# Patient Record
Sex: Male | Born: 1940 | Race: White | Hispanic: No | Marital: Married | Smoking: Never smoker
Health system: Southern US, Community
[De-identification: ages and names within clinical notes are randomized; demographics above are authoritative.]

## PROBLEM LIST (undated history)

## (undated) DIAGNOSIS — N400 Enlarged prostate without lower urinary tract symptoms: Secondary | ICD-10-CM

## (undated) DIAGNOSIS — E790 Hyperuricemia without signs of inflammatory arthritis and tophaceous disease: Secondary | ICD-10-CM

## (undated) DIAGNOSIS — E213 Hyperparathyroidism, unspecified: Secondary | ICD-10-CM

## (undated) DIAGNOSIS — Z9289 Personal history of other medical treatment: Secondary | ICD-10-CM

## (undated) DIAGNOSIS — K219 Gastro-esophageal reflux disease without esophagitis: Secondary | ICD-10-CM

## (undated) DIAGNOSIS — I10 Essential (primary) hypertension: Secondary | ICD-10-CM

## (undated) DIAGNOSIS — G459 Transient cerebral ischemic attack, unspecified: Secondary | ICD-10-CM

## (undated) DIAGNOSIS — N183 Chronic kidney disease, stage 3 unspecified: Secondary | ICD-10-CM

## (undated) DIAGNOSIS — E781 Pure hyperglyceridemia: Secondary | ICD-10-CM

## (undated) DIAGNOSIS — E782 Mixed hyperlipidemia: Secondary | ICD-10-CM

## (undated) DIAGNOSIS — N189 Chronic kidney disease, unspecified: Secondary | ICD-10-CM

## (undated) HISTORY — DX: Hyperuricemia without signs of inflammatory arthritis and tophaceous disease: E79.0

## (undated) HISTORY — DX: Chronic kidney disease, unspecified: N18.9

## (undated) HISTORY — PX: OTHER SURGICAL HISTORY: SHX169

## (undated) HISTORY — DX: Benign prostatic hyperplasia without lower urinary tract symptoms: N40.0

## (undated) HISTORY — DX: Hyperparathyroidism, unspecified: E21.3

## (undated) HISTORY — PX: ACHILLES TENDON REPAIR: SUR1153

## (undated) HISTORY — DX: Gastro-esophageal reflux disease without esophagitis: K21.9

---

## 2000-01-13 ENCOUNTER — Ambulatory Visit (HOSPITAL_COMMUNITY): Admission: RE | Admit: 2000-01-13 | Discharge: 2000-01-13 | Payer: Self-pay | Admitting: Gastroenterology

## 2007-11-14 ENCOUNTER — Ambulatory Visit: Payer: Self-pay | Admitting: Physician Assistant

## 2007-12-04 ENCOUNTER — Ambulatory Visit: Payer: Self-pay | Admitting: Family Medicine

## 2011-12-13 DIAGNOSIS — G459 Transient cerebral ischemic attack, unspecified: Secondary | ICD-10-CM

## 2011-12-13 HISTORY — DX: Transient cerebral ischemic attack, unspecified: G45.9

## 2012-04-23 ENCOUNTER — Ambulatory Visit: Payer: Self-pay | Admitting: Urology

## 2012-05-02 ENCOUNTER — Emergency Department: Payer: Self-pay | Admitting: Emergency Medicine

## 2012-05-02 LAB — CBC WITH DIFFERENTIAL/PLATELET
HCT: 45.9 % (ref 40.0–52.0)
HGB: 15.6 g/dL (ref 13.0–18.0)
Lymphocyte #: 0.9 10*3/uL — ABNORMAL LOW (ref 1.0–3.6)
Lymphocyte %: 8.7 %
MCH: 32.3 pg (ref 26.0–34.0)
MCHC: 34.1 g/dL (ref 32.0–36.0)
MCV: 95 fL (ref 80–100)
Monocyte %: 3.5 %
Neutrophil #: 9.4 10*3/uL — ABNORMAL HIGH (ref 1.4–6.5)
Neutrophil %: 87 %
Platelet: 226 10*3/uL (ref 150–440)
RBC: 4.84 10*6/uL (ref 4.40–5.90)
RDW: 13.4 % (ref 11.5–14.5)

## 2012-05-02 LAB — COMPREHENSIVE METABOLIC PANEL
Albumin: 4.3 g/dL (ref 3.4–5.0)
Alkaline Phosphatase: 61 U/L (ref 50–136)
Bilirubin,Total: 0.7 mg/dL (ref 0.2–1.0)
Calcium, Total: 9.5 mg/dL (ref 8.5–10.1)
Co2: 24 mmol/L (ref 21–32)
Creatinine: 1.72 mg/dL — ABNORMAL HIGH (ref 0.60–1.30)
EGFR (African American): 45 — ABNORMAL LOW
EGFR (Non-African Amer.): 39 — ABNORMAL LOW
Glucose: 192 mg/dL — ABNORMAL HIGH (ref 65–99)
Osmolality: 284 (ref 275–301)
SGPT (ALT): 45 U/L
Total Protein: 8.4 g/dL — ABNORMAL HIGH (ref 6.4–8.2)

## 2012-05-02 LAB — LIPASE, BLOOD: Lipase: 204 U/L (ref 73–393)

## 2012-05-02 LAB — URINALYSIS, COMPLETE
Bacteria: NONE SEEN
Bilirubin,UR: NEGATIVE
Glucose,UR: NEGATIVE mg/dL (ref 0–75)
Nitrite: NEGATIVE
Ph: 6 (ref 4.5–8.0)
RBC,UR: 2700 /HPF (ref 0–5)
Specific Gravity: 1.017 (ref 1.003–1.030)
Squamous Epithelial: NONE SEEN

## 2013-10-08 ENCOUNTER — Emergency Department (HOSPITAL_COMMUNITY)
Admission: EM | Admit: 2013-10-08 | Discharge: 2013-10-08 | Disposition: A | Discharge status: Home / Self Care | Payer: Medicare Other | Attending: Emergency Medicine | Admitting: Emergency Medicine

## 2013-10-08 ENCOUNTER — Emergency Department (HOSPITAL_COMMUNITY): Payer: Medicare Other

## 2013-10-08 ENCOUNTER — Encounter (HOSPITAL_COMMUNITY): Payer: Self-pay | Admitting: Emergency Medicine

## 2013-10-08 DIAGNOSIS — Z7982 Long term (current) use of aspirin: Secondary | ICD-10-CM | POA: Insufficient documentation

## 2013-10-08 DIAGNOSIS — M545 Low back pain, unspecified: Secondary | ICD-10-CM | POA: Insufficient documentation

## 2013-10-08 DIAGNOSIS — I10 Essential (primary) hypertension: Secondary | ICD-10-CM | POA: Insufficient documentation

## 2013-10-08 DIAGNOSIS — M549 Dorsalgia, unspecified: Secondary | ICD-10-CM

## 2013-10-08 DIAGNOSIS — Z791 Long term (current) use of non-steroidal anti-inflammatories (NSAID): Secondary | ICD-10-CM | POA: Insufficient documentation

## 2013-10-08 DIAGNOSIS — Z79899 Other long term (current) drug therapy: Secondary | ICD-10-CM | POA: Insufficient documentation

## 2013-10-08 DIAGNOSIS — Z87442 Personal history of urinary calculi: Secondary | ICD-10-CM | POA: Insufficient documentation

## 2013-10-08 HISTORY — DX: Essential (primary) hypertension: I10

## 2013-10-08 LAB — URINALYSIS, ROUTINE W REFLEX MICROSCOPIC
Bilirubin Urine: NEGATIVE
Glucose, UA: NEGATIVE mg/dL
Hgb urine dipstick: NEGATIVE
Ketones, ur: NEGATIVE mg/dL
Protein, ur: NEGATIVE mg/dL

## 2013-10-08 MED ORDER — DEXAMETHASONE SODIUM PHOSPHATE 10 MG/ML IJ SOLN
10.0000 mg | Freq: Once | INTRAMUSCULAR | Status: AC
  Administered 2013-10-08: 10 mg via INTRAMUSCULAR
  Filled 2013-10-08: qty 1

## 2013-10-08 NOTE — ED Notes (Signed)
Pt returned from xray

## 2013-10-08 NOTE — ED Notes (Signed)
Patient states started having back pain x 10 days.  Patient states 10/10 sharp in nature in L flank.   Patient denies urinary problems.   Patient states he had lamenectomy L5 in 1990.   Patient states it feels like that again.   Patient states has been to primary doctor and received muscle relaxants and anti inflammatories, with no relief.

## 2013-10-08 NOTE — ED Notes (Signed)
Patient has had back pain for ten days so he went to his PCP and he got his urine checked for blood and infection and it was clear.  The PCP did give him a muscle relaxer.  The patient has had kidney stones in the past and it does not feel the same.  He says the pain is intermittent and it was getting better and he has had spasms that bring him to his knees.  He says this time he was trimming some herbs when his pain started ten days ago.  Today, he was just standing and he began to have pain and couldn't move.  It lasted 20-30 seconds, went down to his knees and it got better.  He is here to be evaluated.

## 2013-10-08 NOTE — ED Notes (Signed)
Family at bedside. 

## 2013-10-08 NOTE — ED Provider Notes (Addendum)
CSN: [id number]     Arrival date & time 10/08/13  1159 History   First MD Initiated Contact with Patient 10/08/13 1354     Chief Complaint  Patient presents with  . Back Pain   (Consider location/radiation/quality/duration/timing/severity/associated sxs/prior Treatment) HPI Brendan Nguyen 72 year old male presents emergency department chief complaint of left lower back pain.  Patient came to the emergency department for evaluation.  He states that he was sent for MRI.  The patient has a history of degenerative spinal changes.  He does have history of laminectomy at L5 space.  The patient states that these ago he twisted and had immediate the left side.  He has seen by his primary care physician who treated him with Mobic and muscle relaxer.  The patient states his pain got better however today he was standing at the counter eating when he had sudden onset severe spasm of left-sided "brought him to his knees."  Patient called Dr. Trudee Grip their office who referred him to the ED.  she has a distant history of kidney stones.  He is unsure if his pain today is similar.  He said the pain is sudden, gripping, lasting approximately 30 seconds.  It does not radiate.  He denies any urinary symptoms.Denies weakness, loss of bowel/bladder function or saddle anesthesia. Denies neck stiffness, headache, rash.  Denies fever or recent procedures to back.   Past Medical History  Diagnosis Date  . Hypertension    Past Surgical History  Procedure Laterality Date  . L5 lamenectomy     No family history on file. History  Substance Use Topics  . Smoking status: Never Smoker   . Smokeless tobacco: Not on file  . Alcohol Use: No    Review of Systems Ten systems reviewed and are negative for acute change, except as noted in the HPI.   Allergies  Review of patient's allergies indicates no known allergies.  Home Medications   Current Outpatient Rx  Name  Route  Sig  Dispense  Refill  . aspirin EC 81 MG  tablet   Oral   Take 81 mg by mouth daily.         Marland Kitchen gemfibrozil (LOPID) 600 MG tablet   Oral   Take 600 mg by mouth 2 (two) times daily before a meal.         . lisinopril (PRINIVIL,ZESTRIL) 10 MG tablet   Oral   Take 10 mg by mouth daily.         . meloxicam (MOBIC) 15 MG tablet   Oral   Take 15 mg by mouth daily.         . metaxalone (SKELAXIN) 800 MG tablet   Oral   Take 800 mg by mouth 3 (three) times daily.          BP 115/59  Pulse 73  Temp(Src) 97.5 F (36.4 C) (Oral)  Resp 18  Ht 5\' 8"  (1.727 m)  Wt 201 lb 1.6 oz (91.218 kg)  BMI 30.58 kg/m2  SpO2 99% Physical Exam Physical Exam  Nursing note and vitals reviewed. Constitutional: He appears well-developed and well-nourished. No distress.  HENT:  Head: Normocephalic and atraumatic.  Eyes: Conjunctivae normal are normal. No scleral icterus.  Neck: Normal range of motion. Neck supple.  Cardiovascular: Normal rate, regular rhythm and normal heart sounds.   Pulmonary/Chest: Effort normal and breath sounds normal. No respiratory distress.  Abdominal: Soft. There is no tenderness.  Musculoskeletal: He exhibits no edema.  No TTP. Limited rom  of the back due stiffness. DTRs and strength normal. - straight leg raise.  Neurological: He is alert.  Skin: Skin is warm and dry. He is not diaphoretic.  Psychiatric: His behavior is normal.    ED Course  Procedures (including critical care time) Labs Review Labs Reviewed  URINALYSIS, ROUTINE W REFLEX MICROSCOPIC   Imaging Review Dg Lumbar Spine Complete  10/08/2013   CLINICAL DATA:  Left flank pain, back pain  EXAM: LUMBAR SPINE - COMPLETE 4+ VIEW  COMPARISON:  None.  FINDINGS: Diffuse lower thoracic and lumbar degenerative changes and spondylosis. Lower lumbar facet arthropathy also present from L3-S1. No compression fracture, wedge-shaped deformity or focal kyphosis. No pars defects. Pedicles appear intact. Nonobstructive bowel gas pattern. Pelvic  calcifications consistent with venous phleboliths. Symmetric SI joint arthropathy.  IMPRESSION: Degenerative changes. No acute finding.   Electronically Signed   By: Ruel Favors M.D.   On: 10/08/2013 16:07    EKG Interpretation   None       MDM   1. Back pain     Filed Vitals:   10/08/13 1619  BP: 115/59  Pulse: 73  Temp:   Resp: 18    The patient without red like symptoms.  He would not qualify for MRI to the emergency department.  Explained this to the patient to seek he understands.  Patient had a urine drawn yesterday that was negative however will repeat the examination to evaluate for possible kidney stone.  Patient also requested plain films of the back.     5:06 PM Patient without apparent emergent need for MRI. No red flag symptoms. No pain here in ED.  I have spoken with office personal at Dr. Trudee Grip office who states that they did advise the patient to come to the ED, however they did not advise the MRI would necessarily be done.  Will repeat a urine and plain films on the patient.  Patient urine without signs of infection, white cells or blood consistent with possible kidney stone.  He has no repeat of his pain here at the emergency department.  The patient is currently taking Mobic and antispasmodic.  Patient also has Percocet at home from previous injury.    The patient will be given IM Decadron here in the ED.  He is to followup with Dr. Trudee Grip office tomorrow.  Advised patient to take by mouth pain medication if his pain again becomes severe.  Do not feel he needs any further evaluation at this time.. The patient appears reasonably screened and/or stabilized for discharge and I doubt any other medical condition or other [facility] requiring further screening, evaluation, or treatment in the ED at this time prior to discharge.     Arthor Captain, PA-C 10/08/13 1709  Arthor Captain, PA-C 10/20/13 [phone]

## 2013-10-09 NOTE — ED Provider Notes (Signed)
Medical screening examination/treatment/procedure(s) were performed by non-physician practitioner and as supervising physician I was immediately available for consultation/collaboration.  EKG Interpretation   None        Shaneal Barasch R. Hero Kulish, MD 10/09/13 2233 

## 2013-10-21 NOTE — ED Provider Notes (Signed)
Medical screening examination/treatment/procedure(s) were performed by non-physician practitioner and as supervising physician I was immediately available for consultation/collaboration.  EKG Interpretation   None        Juliet Rude. Rubin Payor, MD 10/21/13 1454

## 2015-04-05 NOTE — Op Note (Signed)
PATIENT NAME:  Brendan Nguyen, Brendan Nguyen MR#:  [id number] DATE OF BIRTH:  07-12-1941  DATE OF PROCEDURE:  04/23/2012  PREOPERATIVE DIAGNOSIS: Benign prostatic hypertrophy with bladder outlet obstruction.   PREOPERATIVE DIAGNOSES:  1. Benign prostatic hypertrophy with bladder outlet obstruction.  2. Superficial bladder cancer. 3. Meatal stenosis.  PROCEDURES:  1. Photovaporization of the prostate with green light laser. 2. Transurethral resection of bladder tumor and vaporization with the green light laser. 3. Meatotomy.   SURGEON: Otelia Limes. Yves Dill, MD   ANESTHETIST: Dr. Phineas Douglas   ANESTHETIC METHOD: General.   INDICATIONS: See the dictated history and physical. After informed consent, the patient requests the above procedure.   OPERATIVE SUMMARY: After adequate general anesthesia had been obtained, the patient was placed into dorsal lithotomy position and the perineum was prepped and draped in the usual fashion. The patient had a meatal stenosis with a 3 mm meatus. I was unable to pass the laser scope. Therefore, meatotomy was clamped and meatotomy was performed. At this point the scope was easily passed into the bladder. Scope was coupled with the camera to assist passage into the bladder. The patient had trilobar BPH with visual obstruction. Bladder was then thoroughly examined. Both ureteral orifices were identified and had clear efflux. The patient had a 5 mm papillary tumor just lateral to the right ureteral orifice. Using the green light laser fiber set at 80 watts, the tumor was vaporized and the base was also cauterized. At this point vaporization of the prostate was begun at the 6 o'clock position and the bladder neck tissue and median lobe was vaporized at a setting of 80 watts. Next, the lateral tissue followed by anterior and apical tissue was vaporized. After complete vaporization, the scope was removed. A 20 French silicone catheter was placed and irrigated until clear. A B and O suppository  was placed. The procedure was then terminated and the patient was transferred to the recovery room in stable condition.   ____________________________ Otelia Limes. Yves Dill, MD [email or web address] D: 04/23/2012 08:52:55 ET T: 04/23/2012 13:14:17 ET JOB#: [id number]  cc: Otelia Limes. Yves Dill, MD, <Dictator> Royston Cowper MD ELECTRONICALLY SIGNED 04/23/2012 13:35

## 2015-04-05 NOTE — H&P (Signed)
PATIENT NAME:  Brendan Nguyen, Brendan Nguyen MR#:  [id number] DATE OF BIRTH:  [birth year]  DATE OF ADMISSION:  04/23/2012  CHIEF COMPLAINT: Difficulty voiding.   HISTORY OF PRESENT ILLNESS: Mr. Keatts is a  74 year old white male with a long history of benign prostatic hypertrophy and LUTS. He is status post microwave thermal therapy in February 2012, but his symptoms have recurred. AUA symptom score was 15 with a quality of life score of 4. He comes in now for green light photovaporization of the prostate.   ALLERGIES: No known drug allergies.   CURRENT MEDICATIONS: Cialis.   PAST SURGICAL HISTORY: L5 laminectomy in 1989.   SOCIAL HISTORY: He denied tobacco or alcohol use.   FAMILY HISTORY: Noncontributory.   PAST AND CURRENT MEDICAL CONDITIONS:  1. Chronic back pain.  2. Erectile dysfunction.  3. Hypogonadism.   REVIEW OF SYSTEMS: The patient denied chest pain, shortness of breath, diabetes, stroke, or hypertension.   PHYSICAL EXAMINATION:   GENERAL: Well-nourished white male in no acute distress.   HEENT: Pupils were equally round and reactive to light and accommodation. Extraocular movements were intact.   NECK: Supple. No palpable cervical adenopathy. No audible carotid bruits.   LUNGS: Clear to auscultation.   HEART: Regular rate and rhythm without audible murmurs or gallops.   ABDOMEN: Soft and nontender abdomen.   GENITOURINARY: Circumcised. Testes smooth and nontender.   RECTAL: 50 grams, smooth nontender prostate.   NEUROMUSCULAR: Alert and oriented x3.   IMPRESSION: Benign prostatic hypertrophy with bladder outlet obstruction.   PLAN: Photovaporization of the prostate with the green light laser.  ____________________________ Otelia Limes. Yves Dill, MD [email or web address] D: 04/17/2012 13:07:47 ET T: 04/17/2012 13:46:10 ET JOB#: [id number]  cc: Otelia Limes. Yves Dill, MD, <Dictator> Royston Cowper MD ELECTRONICALLY SIGNED 04/17/2012 14:42

## 2015-06-05 ENCOUNTER — Telehealth: Payer: Self-pay | Admitting: Podiatry

## 2015-06-05 NOTE — Telephone Encounter (Signed)
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 I KNOW HE IS FULL BUT WAS NOT SURE IF HE WANTED TO WORK IN A DOCTOR. HE HAS TOENAIL FUNGUS AND NEEDS A DEBRIDE.PLEASE CALL ASAP

## 2015-07-06 ENCOUNTER — Encounter: Payer: Self-pay | Admitting: Podiatry

## 2015-07-06 ENCOUNTER — Ambulatory Visit: Payer: Medicare Other

## 2015-07-06 ENCOUNTER — Ambulatory Visit (INDEPENDENT_AMBULATORY_CARE_PROVIDER_SITE_OTHER): Payer: PPO | Admitting: Podiatry

## 2015-07-06 VITALS — BP 116/80 | HR 80 | Resp 16 | Ht 68.0 in | Wt 202.0 lb

## 2015-07-06 DIAGNOSIS — M79673 Pain in unspecified foot: Secondary | ICD-10-CM

## 2015-07-06 DIAGNOSIS — L603 Nail dystrophy: Secondary | ICD-10-CM | POA: Diagnosis not present

## 2015-07-06 NOTE — Progress Notes (Signed)
   Subjective:    Patient ID: Brendan Nguyen, male    DOB: [birth year], 74 y.o.   MRN: [id number]  HPI have a toenail fungus on my toenails, I did get rid of it but it has seemed to come back. Over the counter medication does not seem to work     Review of Systems  Skin:       Change in nails        Objective:   Physical Exam presents today complaining of thick yellow nails he states have been like this for quite some time. He has tried over-the-counter medications which have not helped. I have reviewed his past medical history medications allergy surgery social history and review of systems. Pulses are strongly palpable bilateral. Neurologic sensorium is intact per Semmes-Weinstein monofilament. Deep tendon reflexes are brisk and equal bilateral. Muscle strength +5 over 5 dorsiflexion plantar flexors and inverters and everters all intrinsic musculature is intact. Orthopedic evaluation demonstrates mild hallux valgus deformity my hammertoe deformities are noted bilaterally but are asymptomatic. Cutaneous evaluation does demonstrate what appears to be tinea pedis plantar aspect of the bilateral foot as well as thick yellow dystrophic onychomycotic nails.        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  Plan: Sample of the nail were taken today and sent for pathologic evaluation I will follow-up with him once the results return.

## 2015-07-27 ENCOUNTER — Encounter: Payer: Self-pay | Admitting: Podiatry

## 2015-07-27 ENCOUNTER — Ambulatory Visit (INDEPENDENT_AMBULATORY_CARE_PROVIDER_SITE_OTHER): Payer: PPO | Admitting: Podiatry

## 2015-07-27 VITALS — BP 143/86 | HR 69 | Resp 12

## 2015-07-27 DIAGNOSIS — L603 Nail dystrophy: Secondary | ICD-10-CM | POA: Diagnosis not present

## 2015-07-27 DIAGNOSIS — Z79899 Other long term (current) drug therapy: Secondary | ICD-10-CM | POA: Diagnosis not present

## 2015-07-27 MED ORDER — TERBINAFINE HCL 250 MG PO TABS: 250.0000 mg | ORAL_TABLET | Freq: Every day | ORAL | Status: DC

## 2015-07-27 NOTE — Progress Notes (Signed)
He presents today for follow-up of his onychomycosis and lab reports. He states that his nails are unchanged.  Objective: Vital signs are stable he is alert and oriented 3 pulses are palpable bilateral. Neurologic sensorium is intact per Semmes-Weinstein monofilament. Deep tendon reflexes are intact bilateral and muscle strength +5 over 5 dorsiflexion plantar flexors and inverters everters all intrinsic musculature is intact. Orthopedic evaluation demonstrate all joints distal to the angle full range of motion without crepitation. Cutaneous evaluation does demonstrate thick yellow dystrophic onychomycotic nails. Pathology report does demonstrate today positive onychomycosis consisting of positive saprophyticus fungus and dramatic phytic fungus.  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  Plan: We discussed the etiology pathology conservative versus surgical therapies. At this point he would like to consider oral anti-fungals. We went over the pros and cons of the oral anti-fungal medications today consisting of rashes and itching headache and steady gait. We also discussed the possible liver artifacts. I requested a liver profile. I went ahead and wrote a prescription for 30 days of Lamisil 250 mg tablets 1 by mouth daily he will watch for signs and symptoms of complications which we discussed in great detail I will follow-up with him in 1 month.

## 2015-07-27 NOTE — Patient Instructions (Signed)

## 2015-07-28 LAB — HEPATIC FUNCTION PANEL: Bilirubin, Direct: 0.16 mg/dL (ref 0.00–0.40)

## 2015-08-24 ENCOUNTER — Encounter: Payer: Self-pay | Admitting: Podiatry

## 2015-08-24 ENCOUNTER — Ambulatory Visit (INDEPENDENT_AMBULATORY_CARE_PROVIDER_SITE_OTHER): Payer: PPO | Admitting: Podiatry

## 2015-08-24 DIAGNOSIS — L603 Nail dystrophy: Secondary | ICD-10-CM

## 2015-08-24 DIAGNOSIS — Z79899 Other long term (current) drug therapy: Secondary | ICD-10-CM | POA: Diagnosis not present

## 2015-08-24 MED ORDER — TERBINAFINE HCL 250 MG PO TABS: 250.0000 mg | ORAL_TABLET | Freq: Every day | ORAL | Status: DC

## 2015-08-24 NOTE — Progress Notes (Signed)
He presents today after his first month of Lamisil. He denies fever chills nausea vomiting muscle aches and pains. No rashes or itching.  Objective: Pulses are strongly palpable. Neurologic sensorium is intact. Deep tendon reflexes are intact bilateral and muscle strength +5 over 5 dorsiflexion plantar flexors and inverters everters all intrinsic musculature is intact. No place demonstrated very little change.  Assessment: Onychomycosis bilateral.  Plan: Continue long-term therapy with Lamisil 250 mg tablets 1 by mouth daily 90 days. I also requested liver profile and we'll follow-up with him should this come back abnormal.

## 2015-09-01 LAB — CBC WITH DIFFERENTIAL/PLATELET
Basophils Absolute: 0 10*3/uL (ref 0.0–0.2)
Basos: 1 %
EOS (ABSOLUTE): 0.4 10*3/uL (ref 0.0–0.4)
EOS: 5 %
HEMATOCRIT: 41.1 % (ref 37.5–51.0)
Hemoglobin: 14.7 g/dL (ref 12.6–17.7)
Immature Grans (Abs): 0 10*3/uL (ref 0.0–0.1)
Immature Granulocytes: 0 %
LYMPHS ABS: 2.2 10*3/uL (ref 0.7–3.1)
Lymphs: 33 %
MCH: 32.3 pg (ref 26.6–33.0)
MCHC: 35.8 g/dL — AB (ref 31.5–35.7)
MCV: 90 fL (ref 79–97)
Monocytes Absolute: 0.4 10*3/uL (ref 0.1–0.9)
Monocytes: 7 %
Neutrophils Absolute: 3.5 10*3/uL (ref 1.4–7.0)
Neutrophils: 54 %
PLATELETS: 213 10*3/uL (ref 150–379)
RBC: 4.55 x10E6/uL (ref 4.14–5.80)
RDW: 14.6 % (ref 12.3–15.4)
WBC: 6.6 10*3/uL (ref 3.4–10.8)

## 2015-09-01 LAB — HEPATIC FUNCTION PANEL
ALK PHOS: 52 IU/L (ref 39–117)
ALT: 35 IU/L (ref 0–44)
AST: 24 IU/L (ref 0–40)
Albumin: 4.6 g/dL (ref 3.5–4.8)
Bilirubin Total: 0.6 mg/dL (ref 0.0–1.2)
Bilirubin, Direct: 0.33 mg/dL (ref 0.00–0.40)
Total Protein: 7.3 g/dL (ref 6.0–8.5)

## 2015-09-14 ENCOUNTER — Other Ambulatory Visit: Payer: Self-pay | Admitting: Family Medicine

## 2015-09-18 ENCOUNTER — Other Ambulatory Visit: Payer: Self-pay | Admitting: Family Medicine

## 2015-10-23 ENCOUNTER — Other Ambulatory Visit: Payer: Self-pay | Admitting: Family Medicine

## 2015-10-25 ENCOUNTER — Other Ambulatory Visit: Payer: Self-pay | Admitting: Family Medicine

## 2015-11-10 ENCOUNTER — Telehealth: Payer: Self-pay | Admitting: Family Medicine

## 2015-11-10 NOTE — Telephone Encounter (Signed)
LMTCB

## 2015-11-10 NOTE — Telephone Encounter (Signed)
Pt wants to do labs before appt on 12/5.  Please call [phone]

## 2015-11-11 NOTE — Telephone Encounter (Signed)
Patient was advised that he needs office visit before blood work ordered. Patient has not been in office since 03/2015. Patient had labs ordered last year and labs were not performed.

## 2015-11-16 ENCOUNTER — Ambulatory Visit (INDEPENDENT_AMBULATORY_CARE_PROVIDER_SITE_OTHER): Payer: PPO | Admitting: Family Medicine

## 2015-11-16 ENCOUNTER — Telehealth: Payer: Self-pay | Admitting: *Deleted

## 2015-11-16 ENCOUNTER — Encounter: Payer: Self-pay | Admitting: Family Medicine

## 2015-11-16 VITALS — BP 136/76 | HR 68 | Temp 97.4°F | Resp 16 | Ht 68.0 in | Wt 212.0 lb

## 2015-11-16 DIAGNOSIS — I10 Essential (primary) hypertension: Secondary | ICD-10-CM

## 2015-11-16 DIAGNOSIS — R42 Dizziness and giddiness: Secondary | ICD-10-CM

## 2015-11-16 NOTE — Patient Instructions (Signed)
Patient declines all immunizations

## 2015-11-16 NOTE — Telephone Encounter (Signed)
Pt states he has run out of his rx for the fungus.  I reviewed the medication records and he was prescribed 90 doses of Terbinafine.  I called the phone number and the male answered states she can give pt a message.  I told her pt had received 90 doses and that was the therapeutic dosing of the medication, she said she would tell the pt.

## 2015-11-16 NOTE — Progress Notes (Signed)
Name: Brendan Nguyen   MRN: [id number]    DOB: [birth year]   Date:11/16/2015       Progress Note  Subjective  Chief Complaint  No chief complaint on file.   HPI Here with  Questions about meds.  Occ. Gets dizzy.  But no specific sx.otherwise.  No problem-specific assessment & plan notes found for this encounter.   Past Medical History  Diagnosis Date  . Hypertension     Past Surgical History  Procedure Laterality Date  . L5 lamenectomy      History reviewed. No pertinent family history.  Social History   Social History  . Marital Status: Married    Spouse Name: N/A  . Number of Children: N/A  . Years of Education: N/A   Occupational History  . Not on file.   Social History Main Topics  . Smoking status: Never Smoker   . Smokeless tobacco: Not on file  . Alcohol Use: No  . Drug Use: No  . Sexual Activity: Not on file   Other Topics Concern  . Not on file   Social History Narrative     Current outpatient prescriptions:  .  aspirin EC 81 MG tablet, Take 81 mg by mouth daily., Disp: , Rfl:  .  gemfibrozil (LOPID) 600 MG tablet, TAKE 1 TABLET BY MOUTH TWICE A DAY, Disp: 30 tablet, Rfl: 0 .  losartan (COZAAR) 25 MG tablet, TAKE 1 TABLET BY MOUTH EVERY DAY (**NEED OFFICE VISIT), Disp: 15 tablet, Rfl: 0 .  terbinafine (LAMISIL) 250 MG tablet, Take 1 tablet (250 mg total) by mouth daily., Disp: 90 tablet, Rfl: 0  Not on File   Review of Systems  Constitutional: Negative for fever, chills, weight loss and malaise/fatigue.  HENT: Negative for hearing loss.   Eyes: Negative for blurred vision and double vision.  Respiratory: Negative for cough, shortness of breath and wheezing.   Cardiovascular: Negative for chest pain, palpitations and leg swelling.  Gastrointestinal: Negative for heartburn, abdominal pain and blood in stool.  Genitourinary: Negative for dysuria, urgency and frequency.  Musculoskeletal: Negative for myalgias and back pain.  Neurological:  Positive for dizziness (occ.). Negative for tremors, weakness and headaches.  Psychiatric/Behavioral: Negative for depression.      Objective  Filed Vitals:   11/16/15 1345  BP: 136/76  Pulse: 68  Temp: 97.4 F (36.3 C)  Resp: 16  Height: 5\' 8"  (1.727 m)  Weight: 212 lb (96.163 kg)    Physical Exam  Constitutional: He is oriented to person, place, and time and well-developed, well-nourished, and in no distress. No distress.  HENT:  Head: Normocephalic and atraumatic.  Eyes: Conjunctivae and EOM are normal. Pupils are equal, round, and reactive to light. No scleral icterus.  Neck: Normal range of motion. Neck supple. Carotid bruit is not present. No thyromegaly present.  Cardiovascular: Normal rate, regular rhythm, normal heart sounds and intact distal pulses.  Exam reveals no gallop and no friction rub.   No murmur heard. Pulmonary/Chest: Effort normal and breath sounds normal. No respiratory distress. He has no wheezes. He has no rales.  Abdominal: Soft. Bowel sounds are normal. He exhibits no distension. There is no tenderness. There is no rebound.  Musculoskeletal: Normal range of motion. He exhibits no edema.  Lymphadenopathy:    He has no cervical adenopathy.  Neurological: He is alert and oriented to person, place, and time.  Vitals reviewed.      Recent Results (from the past 2160 hour(s))  Hepatic function  panel     Status: None   Collection Time: 08/31/15  2:26 PM  Result Value Ref Range   Total Protein 7.3 6.0 - 8.5 g/dL   Albumin 4.6 3.5 - 4.8 g/dL   Bilirubin Total 0.6 0.0 - 1.2 mg/dL   Bilirubin, Direct 0.33 0.00 - 0.40 mg/dL   Alkaline Phosphatase 52 39 - 117 IU/L   AST 24 0 - 40 IU/L   ALT 35 0 - 44 IU/L  CBC with Differential/Platelet     Status: Abnormal   Collection Time: 08/31/15  2:26 PM  Result Value Ref Range   WBC 6.6 3.4 - 10.8 x10E3/uL   RBC 4.55 4.14 - 5.80 x10E6/uL   Hemoglobin 14.7 12.6 - 17.7 g/dL   Hematocrit 41.1 37.5 - 51.0 %    MCV 90 79 - 97 fL   MCH 32.3 26.6 - 33.0 pg   MCHC 35.8 (H) 31.5 - 35.7 g/dL   RDW 14.6 12.3 - 15.4 %   Platelets 213 150 - 379 x10E3/uL   Neutrophils 54 %   Lymphs 33 %   Monocytes 7 %   Eos 5 %   Basos 1 %   Neutrophils Absolute 3.5 1.4 - 7.0 x10E3/uL   Lymphocytes Absolute 2.2 0.7 - 3.1 x10E3/uL   Monocytes Absolute 0.4 0.1 - 0.9 x10E3/uL   EOS (ABSOLUTE) 0.4 0.0 - 0.4 x10E3/uL   Basophils Absolute 0.0 0.0 - 0.2 x10E3/uL   Immature Granulocytes 0 %   Immature Grans (Abs) 0.0 0.0 - 0.1 x10E3/uL     Assessment & Plan  Problem List Items Addressed This Visit      Cardiovascular and Mediastinum   HBP (high blood pressure)    Other Visit Diagnoses    Dizziness    -  Primary    Relevant Orders    CBC with Differential    Lipid Profile    Comprehensive Metabolic Panel (CMET)    TSH       No orders of the defined types were placed in this encounter.   1. Dizziness  - CBC with Differential - Lipid Profile - Comprehensive Metabolic Panel (CMET) - TSH  2. Essential hypertension -cont. meds

## 2015-11-20 LAB — CBC WITH DIFFERENTIAL/PLATELET
BASOS: 1 %
Basophils Absolute: 0.1 10*3/uL (ref 0.0–0.2)
EOS (ABSOLUTE): 0.4 10*3/uL (ref 0.0–0.4)
EOS: 6 %
HEMATOCRIT: 42.8 % (ref 37.5–51.0)
HEMOGLOBIN: 15.6 g/dL (ref 12.6–17.7)
IMMATURE GRANULOCYTES: 1 %
Immature Grans (Abs): 0.1 10*3/uL (ref 0.0–0.1)
LYMPHS ABS: 2.1 10*3/uL (ref 0.7–3.1)
Lymphs: 32 %
MCH: 32.8 pg (ref 26.6–33.0)
MCHC: 36.4 g/dL — ABNORMAL HIGH (ref 31.5–35.7)
MCV: 90 fL (ref 79–97)
MONOS ABS: 0.6 10*3/uL (ref 0.1–0.9)
Monocytes: 9 %
Neutrophils Absolute: 3.4 10*3/uL (ref 1.4–7.0)
Neutrophils: 51 %
Platelets: 217 10*3/uL (ref 150–379)
RBC: 4.75 x10E6/uL (ref 4.14–5.80)
RDW: 13.7 % (ref 12.3–15.4)
WBC: 6.6 10*3/uL (ref 3.4–10.8)

## 2015-11-21 LAB — COMPREHENSIVE METABOLIC PANEL
A/G RATIO: 2 (ref 1.1–2.5)
ALBUMIN: 4.9 g/dL — AB (ref 3.5–4.8)
ALT: 24 IU/L (ref 0–44)
AST: 25 IU/L (ref 0–40)
Alkaline Phosphatase: 58 IU/L (ref 39–117)
BUN/Creatinine Ratio: 20 (ref 10–22)
BUN: 30 mg/dL — ABNORMAL HIGH (ref 8–27)
Bilirubin Total: 0.6 mg/dL (ref 0.0–1.2)
CALCIUM: 10.6 mg/dL — AB (ref 8.6–10.2)
CO2: 23 mmol/L (ref 18–29)
Chloride: 99 mmol/L (ref 97–106)
Creatinine, Ser: 1.49 mg/dL — ABNORMAL HIGH (ref 0.76–1.27)
GFR calc Af Amer: 52 mL/min/{1.73_m2} — ABNORMAL LOW (ref >59)
GFR calc non Af Amer: 45 mL/min/{1.73_m2} — ABNORMAL LOW (ref >59)
Globulin, Total: 2.4 g/dL (ref 1.5–4.5)
Glucose: 115 mg/dL — ABNORMAL HIGH (ref 65–99)
POTASSIUM: 5 mmol/L (ref 3.5–5.2)
Sodium: 139 mmol/L (ref 136–144)
Total Protein: 7.3 g/dL (ref 6.0–8.5)

## 2015-11-21 LAB — LIPID PANEL
Chol/HDL Ratio: 7.9 ratio units — ABNORMAL HIGH (ref 0.0–5.0)
Cholesterol, Total: 197 mg/dL (ref 100–199)
HDL: 25 mg/dL — ABNORMAL LOW (ref >39)
Triglycerides: 667 mg/dL (ref 0–149)

## 2015-11-21 LAB — TSH: TSH: 2.54 u[IU]/mL (ref 0.450–4.500)

## 2015-11-24 ENCOUNTER — Other Ambulatory Visit: Payer: Self-pay | Admitting: Family Medicine

## 2015-11-24 LAB — HGB A1C W/O EAG: HEMOGLOBIN A1C: 6.2 % — AB (ref 4.8–5.6)

## 2015-11-27 LAB — SPECIMEN STATUS REPORT

## 2015-12-07 ENCOUNTER — Other Ambulatory Visit: Payer: Self-pay | Admitting: Family Medicine

## 2015-12-28 ENCOUNTER — Ambulatory Visit: Payer: PPO | Admitting: Podiatry

## 2015-12-30 ENCOUNTER — Other Ambulatory Visit: Payer: Self-pay | Admitting: *Deleted

## 2015-12-30 MED ORDER — LOSARTAN POTASSIUM 25 MG PO TABS: 25.0000 mg | ORAL_TABLET | Freq: Every day | ORAL | Status: DC

## 2016-01-04 ENCOUNTER — Ambulatory Visit (INDEPENDENT_AMBULATORY_CARE_PROVIDER_SITE_OTHER): Payer: PPO | Admitting: Podiatry

## 2016-01-04 ENCOUNTER — Encounter: Payer: Self-pay | Admitting: Podiatry

## 2016-01-04 DIAGNOSIS — Z79899 Other long term (current) drug therapy: Secondary | ICD-10-CM

## 2016-01-04 DIAGNOSIS — L603 Nail dystrophy: Secondary | ICD-10-CM | POA: Diagnosis not present

## 2016-01-04 DIAGNOSIS — M79675 Pain in left toe(s): Secondary | ICD-10-CM | POA: Diagnosis not present

## 2016-01-04 DIAGNOSIS — M79674 Pain in right toe(s): Secondary | ICD-10-CM

## 2016-01-04 MED ORDER — TERBINAFINE HCL 250 MG PO TABS: 250.0000 mg | ORAL_TABLET | Freq: Every day | ORAL | Status: DC

## 2016-01-04 NOTE — Progress Notes (Signed)
He presents today for a follow-up of his onychomycosis. He states they are starting to clear up but I would like to have them clear to 100% and also need help cutting them because they're thick.  Objective: pulses are palpable bilateral. No calf pain. Nail plates are cleared by approximately 50% at this time. Pain in limb with onychomycosis.   Assessment nail dystrophy onychomycosis pain in limb.  Plan: debridement of toenails bilateral. I also started him on Lamisil 250 mg tablets 1 every other day 2 months. Follow-up with me in 3 months.

## 2016-02-05 ENCOUNTER — Other Ambulatory Visit: Payer: Self-pay | Admitting: Family Medicine

## 2016-02-06 DIAGNOSIS — R42 Dizziness and giddiness: Secondary | ICD-10-CM | POA: Diagnosis not present

## 2016-02-12 DIAGNOSIS — R002 Palpitations: Secondary | ICD-10-CM | POA: Diagnosis not present

## 2016-02-12 DIAGNOSIS — R55 Syncope and collapse: Secondary | ICD-10-CM | POA: Diagnosis not present

## 2016-02-12 DIAGNOSIS — I10 Essential (primary) hypertension: Secondary | ICD-10-CM | POA: Diagnosis not present

## 2016-02-17 DIAGNOSIS — R55 Syncope and collapse: Secondary | ICD-10-CM | POA: Diagnosis not present

## 2016-02-17 DIAGNOSIS — R002 Palpitations: Secondary | ICD-10-CM | POA: Diagnosis not present

## 2016-02-24 DIAGNOSIS — I10 Essential (primary) hypertension: Secondary | ICD-10-CM | POA: Diagnosis not present

## 2016-02-24 DIAGNOSIS — R55 Syncope and collapse: Secondary | ICD-10-CM | POA: Diagnosis not present

## 2016-02-24 DIAGNOSIS — E781 Pure hyperglyceridemia: Secondary | ICD-10-CM | POA: Diagnosis not present

## 2016-02-26 ENCOUNTER — Other Ambulatory Visit: Payer: Self-pay | Admitting: Family Medicine

## 2016-02-26 DIAGNOSIS — Z9229 Personal history of other drug therapy: Secondary | ICD-10-CM | POA: Diagnosis not present

## 2016-02-26 DIAGNOSIS — I10 Essential (primary) hypertension: Secondary | ICD-10-CM | POA: Diagnosis not present

## 2016-02-26 DIAGNOSIS — E781 Pure hyperglyceridemia: Secondary | ICD-10-CM | POA: Diagnosis not present

## 2016-02-26 DIAGNOSIS — G8929 Other chronic pain: Secondary | ICD-10-CM | POA: Diagnosis not present

## 2016-02-26 DIAGNOSIS — M25551 Pain in right hip: Secondary | ICD-10-CM | POA: Diagnosis not present

## 2016-02-26 DIAGNOSIS — R2689 Other abnormalities of gait and mobility: Secondary | ICD-10-CM | POA: Diagnosis not present

## 2016-02-26 DIAGNOSIS — M1611 Unilateral primary osteoarthritis, right hip: Secondary | ICD-10-CM | POA: Diagnosis not present

## 2016-02-29 DIAGNOSIS — L578 Other skin changes due to chronic exposure to nonionizing radiation: Secondary | ICD-10-CM | POA: Diagnosis not present

## 2016-02-29 DIAGNOSIS — L570 Actinic keratosis: Secondary | ICD-10-CM | POA: Diagnosis not present

## 2016-02-29 DIAGNOSIS — L812 Freckles: Secondary | ICD-10-CM | POA: Diagnosis not present

## 2016-02-29 DIAGNOSIS — L821 Other seborrheic keratosis: Secondary | ICD-10-CM | POA: Diagnosis not present

## 2016-02-29 DIAGNOSIS — Z808 Family history of malignant neoplasm of other organs or systems: Secondary | ICD-10-CM | POA: Diagnosis not present

## 2016-03-04 DIAGNOSIS — E781 Pure hyperglyceridemia: Secondary | ICD-10-CM | POA: Diagnosis not present

## 2016-03-04 DIAGNOSIS — Z9229 Personal history of other drug therapy: Secondary | ICD-10-CM | POA: Diagnosis not present

## 2016-03-04 DIAGNOSIS — I10 Essential (primary) hypertension: Secondary | ICD-10-CM | POA: Diagnosis not present

## 2016-03-07 ENCOUNTER — Encounter: Payer: Self-pay | Admitting: Physical Therapy

## 2016-03-07 ENCOUNTER — Ambulatory Visit: Payer: PPO | Attending: Family Medicine

## 2016-03-07 DIAGNOSIS — M25551 Pain in right hip: Secondary | ICD-10-CM | POA: Diagnosis not present

## 2016-03-07 DIAGNOSIS — R29898 Other symptoms and signs involving the musculoskeletal system: Secondary | ICD-10-CM | POA: Diagnosis not present

## 2016-03-07 NOTE — Patient Instructions (Signed)
Flexors, Standing    Stand, one foot on chair. Use support for balance, if needed. Slowly shift weight toward raised knee until stretch is felt in the deep hip flexor of standing leg and hamstrings of forward leg. Hold _30__ seconds.  Repeat _3__ times per session. Do _3__ sessions per day.  Press Up (Extension)    Prop yourself on your elbow. Maintain position with elbow on the floors. Hold for 3-5 minutes or longer. Repeat __6-8__ times throughout the day.   Piriformis Stretch - Supine    Pull R knee across body toward opposite shoulder. Hold slight stretch for _30__ seconds. Repeat _3__ times. Do _3__ times per day.   Single Leg - Eyes Open    Holding support, lift right leg while maintaining balance over other leg. Progress to removing hands from support surface for longer periods of time. Hold__30__ seconds. Repeat _3___ times per session. Do __2__ sessions per day.   Feet Heel-Toe "Tandem", Head Motion - Eyes Open    With eyes open, right foot directly in front of the other, Keep head facing forward. Once you can maintain position for at least 30 seconds without losing balance attempt to move head slowly: left and right. Repeat _3___ times per session. Do _3___ sessions per day.

## 2016-03-09 NOTE — Therapy (Signed)
[facility] [address], [place], [address] Phone: [phone]   Fax:  [phone]  Physical Therapy Evaluation  Patient Details  Name: Brendan Nguyen MRN: [id number] Date of Birth: Oct 15, 1941 Referring Provider: Dion Body  Encounter Date: 03/07/2016      PT End of Session - 03/09/16 0929    Visit Number 1   Number of Visits 13   Date for PT Re-Evaluation 04/18/16   Authorization Type no g codes   PT Start Time 1300   PT Stop Time 1400   PT Time Calculation (min) 60 min   Equipment Utilized During Treatment Gait belt   Activity Tolerance Patient tolerated treatment well   Behavior During Therapy [facility] for tasks assessed/performed      Past Medical History  Diagnosis Date  . Hypertension     Past Surgical History  Procedure Laterality Date  . L5 lamenectomy      There were no vitals filed for this visit.  Visit Diagnosis:  Pain in right hip - Plan: PT plan of care cert/re-cert  Weakness of right hip - Plan: PT plan of care cert/re-cert      Subjective Assessment - 03/09/16 0905    Subjective R hip pain   Pertinent History Pt reports 4-6 months of R hip pain. No known history trauma that he can recall and no prior history of R hip pain. He is also complaining worsening balance which he would also like to address on this date. History of L4/L5 laminectomy in 1972 but currently no complaints of regular back pain that he can associate with his hip pain. Pt states that the hip feels "uncomfortable and weak" but otherwise is unable to describe further. Pt describes the pain as in his R groin and into the medial R thigh. Pt reports radiographs were performed without notable abnormalities with the exception of maybe some mild DJD. Aggravating factors: flexing R hip actively, walking extended distances. Easing factors: heat, sitting, exercise and movement. No radicular symptoms and no  numbness/tingling in RLE. No warmth, redness, swelling, or bruising noted in the area. Pt reports history of prostate cancer which was "taken care of." No recent changes in bowel or bladder function. ROS is negative for red flags.   How long can you walk comfortably? 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 No/denies  Worst: 6/10, Best: 0/10, Present: 0/10            [facility] PT Assessment - 03/09/16 0907    Assessment   Medical Diagnosis R hip pain   Referring Provider Dion Body   Onset Date/Surgical Date 09/12/15  Approximately 4-6 months ago   Next MD Visit Not provided   Prior Therapy None reported   Precautions   Precautions Fall   Restrictions   Weight Bearing Restrictions No   Balance Screen   Has the patient fallen in the past 6 months Yes   How many times? One  Reports he fell onto grass while mowing lawn   Has the patient had a decrease in activity level because of a fear of falling?  Yes   Is the patient reluctant to leave their home because of a fear of falling?  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   ROM / Strength   AROM / PROM / Strength Strength   Strength   Overall  Strength Deficits   Overall Strength Comments Painful R hip AROM flexion (full ROM), painless PROM flexion, painful passive R hip IR. Painful, but strong, resisted R hip ER/IR. Decreased bilateral hip PROM extension however both active and passive extension are painless bilaterally. Lumbar AROM moderately to severly limited in flexion and extension. Lateral flexion and rotation not assessed. R hip scour is negative for pain. Sacral thrust negative for pain. FABER and FADIR negative for reproduction of pain. CPA of lumbar spine do not reproduce pain but are severly hypomobile   Strength Assessment Site Hip;Knee;Ankle   Right/Left Hip Right;Left   Right Hip Flexion 4-/5   Right Hip Extension 4/5   Right Hip External Rotation  4+/5   Right Hip Internal Rotation 4+/5   Right Hip ABduction 4/5   Right Hip ADduction 4/5   Left Hip Flexion 4+/5   Left Hip Extension 4/5   Left Hip External Rotation 4+/5   Left Hip Internal Rotation 4+/5   Left Hip ABduction 4/5   Left Hip ADduction 4/5   Right/Left Knee Right;Left   Right Knee Flexion 5/5   Right Knee Extension 5/5   Left Knee Flexion 5/5   Left Knee Extension 5/5   Right/Left Ankle Right;Left   Right Ankle Dorsiflexion 5/5   Left Ankle Dorsiflexion 5/5   Flexibility   Soft Tissue Assessment /Muscle Length --   Palpation   Palpation comment Pt is tender to palpation on iliopsoas with light pressure when compared to L side.    Transfers   Comments Independent with all transfers without UE support   Ambulation/Gait   Gait Comments Full gait screening deferred at this time however decreased trunk rotation noted during ambulation. Pt also ambulates with slightly widened BOS   Balance   Balance Assessed Yes   Standardized Balance Assessment   Standardized Balance Assessment Dynamic Gait Index;Berg Balance Test   Berg Balance Test   Sit to Stand Able to stand without using hands and stabilize independently   Standing Unsupported Able to stand  safely 2 minutes   Sitting with Back Unsupported but Feet Supported on Floor or Stool Able to sit safely and securely 2 minutes   Stand to Sit Sits safely with minimal use of hands   Transfers Able to transfer safely, minor use of hands   Standing Unsupported with Eyes Closed Able to stand 10 seconds safely   Standing Ubsupported with Feet Together Able to place feet together independently and stand 1 minute safely   From Standing, Reach Forward with Outstretched Arm Can reach confidently >25 cm (10")   From Standing Position, Pick up Object from Floor Able to pick up shoe safely and easily   From Standing Position, Turn to Look Behind Over each Shoulder Looks  behind from both sides and weight shifts well   Turn 360 Degrees Able to turn 360 degrees safely in 4 seconds or less   Standing Unsupported, Alternately Place Feet on Step/Stool Able to stand independently and safely and complete 8 steps in 20 seconds   Standing Unsupported, One Foot in Front Able to plae foot ahead of the other independently and hold 30 seconds   Standing on One Leg Tries to lift leg/unable to hold 3 seconds but remains standing independently   Total Score 52   Dynamic Gait Index   Level Surface Normal   Change in Gait Speed Normal   Gait with Horizontal Head Turns Mild Impairment   Gait with Vertical Head Turns Mild Impairment   Gait and Pivot Turn Normal   Step Over Obstacle Mild Impairment   Step Around Obstacles Normal   Steps Mild Impairment   Total Score 20       TREATMENT; Attempted repeated lumbar flexion and extension without change in symptoms. Pt issued prone on elbow hip flexor stretch, standing hip flexor stretch, supine piriformis stretch, tandem balance, and single leg balance to HEP. Handout provided and all exercises discussed and demonstrated with patient. Pt performed prone on elbow stretch with therapist. Discussed importance of avoided excessive anterior tilting/lumbar extension during  standing R hip flexor stretch;                         PT Long Term Goals - 03/09/16 0939    PT LONG TERM GOAL #1   Title Pt will be independent with HEP for management of R hip pain in order to resume all activities at home and work   Time 6   Period Weeks   Status New   PT LONG TERM GOAL #2   Title Pt will reports at or below 3/10 worst pain on NPRS in R hip with all aggravating activities   Baseline 03/07/16: worst: 6/10   Time 6   Period Weeks   Status New   PT LONG TERM GOAL #3   Title Pt will demonstrates fully symmetrical strength with R hip flexion in order to resume full responsibilities at home/work   Baseline 03/07/16: 4-/5 painful and weak   Time 6   Period Weeks   Status New   PT LONG TERM GOAL #4   Title Pt will demonstrate improvement in balance to at least 55/56 on BERG in order to reduce fall risk and be able to ride motorcycle safely   Baseline 03/07/16: 52/56   Time 6   Period Weeks   Status New               Plan - 03/09/16 [id number]    Clinical Impression Statement Pt is a pleasant 75 yo male referred for insidious onset of R hip pain over the last 4-6 months. PT evaluation is consistent with R hip flexor strain/tendonitis as pt reports pain and weakness with active and resisted R hip flexion. Painful palpation of iliopsoas complex when compared to L side. Passive R hip flexion and R hip scour are negative as well FADIR and FABER positions. Pt also presents with higher level balance deficits scoring 52/56 on BERG and 20/24 on DGI. R hip pain limits patients balance. R hip pain will be the focus primarily and will progress to include more balance activities as pain diminishes. Pt will beneift from skilled PT services to address deficits in R hip pain and balance in  order to resume all work and leisure goals   Pt will benefit from skilled therapeutic intervention in order to improve on the following deficits Abnormal gait;Decreased  balance;Decreased strength;Hypomobility;Pain   Rehab Potential Good   Clinical Impairments Affecting Rehab Potential Positive: motivation; Negative: chronicity   PT Frequency 2x / week   PT Duration 6 weeks   PT Treatment/Interventions Aquatic Therapy;Cryotherapy;Electrical Stimulation;Iontophoresis 4mg /ml Dexamethasone;Moist Heat;Traction;Ultrasound;DME Instruction;Gait training;Therapeutic activities;Therapeutic exercise;Balance training;Neuromuscular re-education;Patient/family education;Dry needling;Other (comment)  spinal mobilization/manipulation   PT Next Visit Plan Continue R hip stretches, lumbar mobilization, STM and iliopsoas release   PT Home Exercise Plan Prone on elbows for hip flexion stretch and lumbar extension, piriformis stretch, standing hip flexor stretch on stair/chair, tandem balance, single leg balance   Consulted and Agree with Plan of Care Patient         Problem List Patient Active Problem List   Diagnosis Date Noted  . HBP (high blood pressure) 11/16/2015   Phillips Grout PT, DPT   Huprich,Jason 03/09/2016, 10:54 AM  [facility] [address] [place], [address] Phone: [phone]   Fax:  [phone]  Name: Brendan Nguyen MRN: [id number] Date of Birth: 1941/06/11

## 2016-03-10 ENCOUNTER — Ambulatory Visit: Payer: PPO | Admitting: Physical Therapy

## 2016-03-14 ENCOUNTER — Ambulatory Visit: Payer: PPO | Attending: Family Medicine | Admitting: Physical Therapy

## 2016-03-14 DIAGNOSIS — M25551 Pain in right hip: Secondary | ICD-10-CM | POA: Diagnosis not present

## 2016-03-14 NOTE — Therapy (Signed)
[facility] [address], [place], [address] Phone: [phone]   Fax:  [phone]  Physical Therapy Treatment  Patient Details  Name: DAXON GREASON MRN: [id number] Date of Birth: 1941/03/17 Referring Provider: Dion Body  Encounter Date: 03/14/2016      PT End of Session - 03/14/16 1208    Visit Number 2   Number of Visits 13   Date for PT Re-Evaluation 04/18/16   Authorization Type no g codes   PT Start Time 1200   PT Stop Time 1240   PT Time Calculation (min) 40 min   Equipment Utilized During Treatment Gait belt   Activity Tolerance Patient tolerated treatment well   Behavior During Therapy [facility] for tasks assessed/performed      Past Medical History  Diagnosis Date  . Hypertension     Past Surgical History  Procedure Laterality Date  . L5 lamenectomy      There were no vitals filed for this visit.  Visit Diagnosis:  Pain in right hip      Subjective Assessment - 03/14/16 1200    Subjective Pt reports continued R inguinal pain. He does report he is exercising inconsistently.   Pertinent History Pt reports 4-6 months of R hip pain. No known history trauma that he can recall and no prior history of R hip pain. He is also complaining worsening balance which he would also like to address on this date. History of L4/L5 laminectomy in 1972 but currently no complaints of regular back pain that he can associate with his hip pain. Pt states that the hip feels "uncomfortable and weak" but otherwise is unable to describe further. Pt describes the pain as in his R groin and into the medial R thigh. Pt reports radiographs were performed without notable abnormalities with the exception of maybe some mild DJD. Aggravating factors: flexing R hip actively, walking extended distances. Easing factors: heat, sitting, exercise and movement. No radicular symptoms and no numbness/tingling in RLE. No warmth,  redness, swelling, or bruising noted in the area. Pt reports history of prostate cancer which was "taken care of." No recent changes in bowel or bladder function. ROS is negative for red flags.   How long can you walk comfortably? 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 No/denies               Objective:  supine hooklying: Palpation of B iliopsoas, incr. Pain on R.   Performed x10 heel slides on L, on R. On R noted incr. Lumbar lordosis and incr. Pain with performance.  Performed manual pinning of psoas, x10 heel slides with decr. Pain but still present.  Performed same with cuing for TA contraction, pain still present but minimal. Educated pt and had pt perform tennis ball and weight compression of psoas with heel slides.  Pt required extensive education, practice, and re-education on palpation region to find most applicable region for performance of exercise.  PT also educated pt on avoiding excessive hip flexion at home until pain improves.                       PT Long Term Goals - 03/09/16 0939    PT LONG TERM GOAL #1   Title Pt will  be independent with HEP for management of R hip pain in order to resume all activities at home and work   Time 6   Period Weeks   Status New   PT LONG TERM GOAL #2   Title Pt will reports at or below 3/10 worst pain on NPRS in R hip with all aggravating activities   Baseline 03/07/16: worst: 6/10   Time 6   Period Weeks   Status New   PT LONG TERM GOAL #3   Title Pt will demonstrates fully symmetrical strength with R hip flexion in order to resume full responsibilities at home/work   Baseline 03/07/16: 4-/5 painful and weak   Time 6   Period Weeks   Status New   PT LONG TERM GOAL #4   Title Pt will demonstrate improvement in balance to at  least 55/56 on BERG in order to reduce fall risk and be able to ride motorcycle safely   Baseline 03/07/16: 52/56   Time 6   Period Weeks   Status New               Plan - 03/14/16 1247    Clinical Impression Statement Pt responded well to focused intervention to reduce pre-activation of iliopsoas. Pain with hip flex. decr. from 5/10 to 3/10 following intervention and pt able to verbalize and understand how to perform at home. Deferred balance tx due to focusing on pain. PT also educated pt on avoiding irritating factors.   Pt will benefit from skilled therapeutic intervention in order to improve on the following deficits Abnormal gait;Decreased balance;Decreased strength;Hypomobility;Pain   Rehab Potential Good   Clinical Impairments Affecting Rehab Potential Positive: motivation; Negative: chronicity   PT Frequency 2x / week   PT Duration 6 weeks   PT Treatment/Interventions Aquatic Therapy;Cryotherapy;Electrical Stimulation;Iontophoresis 4mg /ml Dexamethasone;Moist Heat;Traction;Ultrasound;DME Instruction;Gait training;Therapeutic activities;Therapeutic exercise;Balance training;Neuromuscular re-education;Patient/family education;Dry needling;Other (comment)  spinal mobilization/manipulation   PT Next Visit Plan Continue R hip stretches, lumbar mobilization, STM and iliopsoas release   PT Home Exercise Plan Prone on elbows for hip flexion stretch and lumbar extension, piriformis stretch, standing hip flexor stretch on stair/chair, tandem balance, single leg balance   Consulted and Agree with Plan of Care Patient        Problem List Patient Active Problem List   Diagnosis Date Noted  . HBP (high blood pressure) 11/16/2015    Vendetta Pittinger PT DPT 03/14/2016, 12:55 PM  [facility] [address] [place], [address] Phone: [phone]   Fax:  [phone]  Name: JAQUORI HUNSINGER MRN: [id number] Date of  Birth: 01/04/1941

## 2016-03-15 DIAGNOSIS — N289 Disorder of kidney and ureter, unspecified: Secondary | ICD-10-CM | POA: Diagnosis not present

## 2016-03-15 DIAGNOSIS — E781 Pure hyperglyceridemia: Secondary | ICD-10-CM | POA: Diagnosis not present

## 2016-03-16 ENCOUNTER — Encounter: Payer: Medicare Other | Admitting: Physical Therapy

## 2016-03-17 ENCOUNTER — Ambulatory Visit: Payer: Self-pay | Admitting: Family Medicine

## 2016-03-24 ENCOUNTER — Ambulatory Visit: Payer: PPO | Admitting: Physical Therapy

## 2016-03-24 DIAGNOSIS — R3121 Asymptomatic microscopic hematuria: Secondary | ICD-10-CM | POA: Diagnosis not present

## 2016-03-24 DIAGNOSIS — Z8551 Personal history of malignant neoplasm of bladder: Secondary | ICD-10-CM | POA: Diagnosis not present

## 2016-03-24 DIAGNOSIS — E291 Testicular hypofunction: Secondary | ICD-10-CM | POA: Diagnosis not present

## 2016-03-24 DIAGNOSIS — M25551 Pain in right hip: Secondary | ICD-10-CM | POA: Diagnosis not present

## 2016-03-24 DIAGNOSIS — Z79899 Other long term (current) drug therapy: Secondary | ICD-10-CM | POA: Diagnosis not present

## 2016-03-24 DIAGNOSIS — N5201 Erectile dysfunction due to arterial insufficiency: Secondary | ICD-10-CM | POA: Diagnosis not present

## 2016-03-24 NOTE — Therapy (Signed)
[facility] [address] [place], [address] Phone: [phone]   Fax:  [phone]  Physical Therapy Treatment  Patient Details  Name: Brendan Nguyen MRN: [id number] Date of Birth: 05-09-1941 Referring Provider: Dion Body  Encounter Date: 03/24/2016      PT End of Session - 03/24/16 1306    Visit Number 3   Number of Visits 13   Date for PT Re-Evaluation 04/18/16   Authorization Type no g codes   PT Start Time March 22, 1254   PT Stop Time 1333   PT Time Calculation (min) 38 min   Equipment Utilized During Treatment Gait belt   Activity Tolerance Patient tolerated treatment well   Behavior During Therapy [facility] for tasks assessed/performed      Past Medical History  Diagnosis Date  . Hypertension     Past Surgical History  Procedure Laterality Date  . L5 lamenectomy      There were no vitals filed for this visit.      Subjective Assessment - 03/24/16 1257    Subjective Pt reports significant improvement in pain. primarily now has pain when he "moves quickly".   Pertinent History Pt reports 4-6 months of R hip pain. No known history trauma that he can recall and no prior history of R hip pain. He is also complaining worsening balance which he would also like to address on this date. History of L4/L5 laminectomy in 1971-03-23 but currently no complaints of regular back pain that he can associate with his hip pain. Pt states that the hip feels "uncomfortable and weak" but otherwise is unable to describe further. Pt describes the pain as in his R groin and into the medial R thigh. Pt reports radiographs were performed without notable abnormalities with the exception of maybe some mild DJD. Aggravating factors: flexing R hip actively, walking extended distances. Easing factors: heat, sitting, exercise and movement. No radicular symptoms and no numbness/tingling in RLE. No warmth, redness, swelling, or bruising noted  in the area. Pt reports history of prostate cancer which was "taken care of." No recent changes in bowel or bladder function. ROS is negative for red flags.   Limitations Standing   How long can you walk comfortably? 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 No/denies               Objective: Reviewed psoas release. Corrected performance so pt is able to perform with no lumbar lordosis. Currently when performed pt has most difficulty in distal adductor.  STM performed on distal adductor, cross friction massage x5 min.  Used "stick" on adductors, x5 min, educated pt on performance of this on his own at home (pt has stick at home)  Following this pt able to perform heel slide pain free.  Issued new HEP including adductor and HS isometrics.  Educated pt on managing pain using his home e-stim issue.  Following session pt reported improvement in sit<>stand.                       PT Long Term Goals - 03/09/16 0939    PT LONG TERM GOAL #1   Title Pt will be independent with HEP for management of R hip pain in order to resume all activities  at home and work   Time 6   Period Weeks   Status New   PT LONG TERM GOAL #2   Title Pt will reports at or below 3/10 worst pain on NPRS in R hip with all aggravating activities   Baseline 03/07/16: worst: 6/10   Time 6   Period Weeks   Status New   PT LONG TERM GOAL #3   Title Pt will demonstrates fully symmetrical strength with R hip flexion in order to resume full responsibilities at home/work   Baseline 03/07/16: 4-/5 painful and weak   Time 6   Period Weeks   Status New   PT LONG TERM GOAL #4   Title Pt will demonstrate improvement in balance to at least 55/56 on BERG in order to reduce fall risk and be able to ride motorcycle safely    Baseline 03/07/16: 52/56   Time 6   Period Weeks   Status New               Plan - 03/24/16 1333    Clinical Impression Statement Pain is now consistently 3/10 for pt other than this morning when his pain incr. slightly. noted improvement in ability to utilize LE without excess back activation which improves pain with limb activity. Pt is now able to perform sit<>stand pain free. would benefit fromcontinued PT to address pain and functional tolerance.   Rehab Potential Good   Clinical Impairments Affecting Rehab Potential Positive: motivation; Negative: chronicity   PT Frequency 2x / week   PT Duration 6 weeks   PT Treatment/Interventions Aquatic Therapy;Cryotherapy;Electrical Stimulation;Iontophoresis 4mg /ml Dexamethasone;Moist Heat;Traction;Ultrasound;DME Instruction;Gait training;Therapeutic activities;Therapeutic exercise;Balance training;Neuromuscular re-education;Patient/family education;Dry needling;Other (comment)  spinal mobilization/manipulation   PT Next Visit Plan Continue R hip stretches, lumbar mobilization, STM and iliopsoas release   PT Home Exercise Plan Prone on elbows for hip flexion stretch and lumbar extension, piriformis stretch, standing hip flexor stretch on stair/chair, tandem balance, single leg balance   Consulted and Agree with Plan of Care Patient      Patient will benefit from skilled therapeutic intervention in order to improve the following deficits and impairments:  Abnormal gait, Decreased balance, Decreased strength, Hypomobility, Pain  Visit Diagnosis: Pain in right hip     Problem List Patient Active Problem List   Diagnosis Date Noted  . HBP (high blood pressure) 11/16/2015    Fisher,Brendan Nguyen 03/24/2016, 1:54 PM  [facility] [address], [place], [address] Phone: [phone]   Fax:  [phone]  Name: Brendan Nguyen MRN: [id number] Date of Birth:  26-Mar-1941

## 2016-04-04 ENCOUNTER — Ambulatory Visit: Payer: PPO | Admitting: Physical Therapy

## 2016-04-04 DIAGNOSIS — M25551 Pain in right hip: Secondary | ICD-10-CM

## 2016-04-04 NOTE — Therapy (Signed)
[facility] [address], [place], [address] Phone: [phone]   Fax:  [phone]  Physical Therapy Treatment  Patient Details  Name: Brendan Nguyen MRN: [id number] Date of Birth: June 19, 1941 Referring Provider: Dion Body  Encounter Date: 04/04/2016      PT End of Session - 04/04/16 1229    Visit Number 4   Number of Visits 13   Date for PT Re-Evaluation 04/18/16   Authorization Type no g codes   PT Start Time [id number]   PT Stop Time 1225   PT Time Calculation (min) 40 min   Equipment Utilized During Treatment Gait belt   Activity Tolerance Patient tolerated treatment well   Behavior During Therapy [facility] for tasks assessed/performed      Past Medical History  Diagnosis Date  . Hypertension     Past Surgical History  Procedure Laterality Date  . L5 lamenectomy      There were no vitals filed for this visit.      Subjective Assessment - 04/04/16 1152    Subjective Pt had to travel to and from [facility] which incr. his pain considerably. He has ben inconsistent with his HEP but reports he has improved since then.   Pertinent History Pt reports 4-6 months of R hip pain. No known history trauma that he can recall and no prior history of R hip pain. He is also complaining worsening balance which he would also like to address on this date. History of L4/L5 laminectomy in 1972 but currently no complaints of regular back pain that he can associate with his hip pain. Pt states that the hip feels "uncomfortable and weak" but otherwise is unable to describe further. Pt describes the pain as in his R groin and into the medial R thigh. Pt reports radiographs were performed without notable abnormalities with the exception of maybe some mild DJD. Aggravating factors: flexing R hip actively, walking extended distances. Easing factors: heat, sitting, exercise and movement. No radicular symptoms and no  numbness/tingling in RLE. No warmth, redness, swelling, or bruising noted in the area. Pt reports history of prostate cancer which was "taken care of." No recent changes in bowel or bladder function. ROS is negative for red flags.   Limitations Standing   How long can you walk comfortably? 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 Yes   Pain Score 5    Pain Location Hip   Pain Orientation Right             Objective: Pt had incr. Pain at rest so put in supine which incr. Pt pain. Encouraged hooklying with decr. Slightly. Performed extensive TrP release, compression, C-R for R psoas, pectineus, distal adductor. Oscillatory compression with knee flops x4 min. Oscillatory compression with opposite side back engagement to address insertional psoas tightness.  Following this pt reported decr. Pain to 0/10.  Performed "stick" STM on distal adductor for 3 min - pt reported no change in irritation of adductor.  Instructed and had pt perform HS stretch with towel, piriformis stretch in chair, standing quad/hip flexor stretch.  Issued this to be performed 2x45 sec. B daily.                    PT Education - 04/04/16  1226    Education provided Yes   Education Details avoiding seated flexion for extended periods of time.   Person(s) Educated Patient   Methods Explanation   Comprehension Verbalized understanding             PT Long Term Goals - 03/09/16 0939    PT LONG TERM GOAL #1   Title Pt will be independent with HEP for management of R hip pain in order to resume all activities at home and work   Time 6   Period Weeks   Status New   PT LONG TERM GOAL #2   Title Pt will reports at or below 3/10 worst pain on NPRS in R hip with all aggravating activities   Baseline 03/07/16: worst: 6/10    Time 6   Period Weeks   Status New   PT LONG TERM GOAL #3   Title Pt will demonstrates fully symmetrical strength with R hip flexion in order to resume full responsibilities at home/work   Baseline 03/07/16: 4-/5 painful and weak   Time 6   Period Weeks   Status New   PT LONG TERM GOAL #4   Title Pt will demonstrate improvement in balance to at least 55/56 on BERG in order to reduce fall risk and be able to ride motorcycle safely   Baseline 03/07/16: 52/56   Time 6   Period Weeks   Status New               Plan - 04/04/16 1230    Clinical Impression Statement Due to flareup focused on decr. pt pain. Following manual intervention pt had signfiicant improvement in pain to 1/10. Issued HEP of new stretches and would benefit from one additional session to reassess and progress stretches, depending on response to this session.   Rehab Potential Good   Clinical Impairments Affecting Rehab Potential Positive: motivation; Negative: chronicity   PT Frequency 2x / week   PT Duration 6 weeks   PT Treatment/Interventions Aquatic Therapy;Cryotherapy;Electrical Stimulation;Iontophoresis 4mg /ml Dexamethasone;Moist Heat;Traction;Ultrasound;DME Instruction;Gait training;Therapeutic activities;Therapeutic exercise;Balance training;Neuromuscular re-education;Patient/family education;Dry needling;Other (comment)  spinal mobilization/manipulation   PT Next Visit Plan Continue R hip stretches, lumbar mobilization, STM and iliopsoas release   PT Home Exercise Plan Prone on elbows for hip flexion stretch and lumbar extension, piriformis stretch, standing hip flexor stretch on stair/chair, tandem balance, single leg balance   Consulted and Agree with Plan of Care Patient      Patient will benefit from skilled therapeutic intervention in order to improve the following deficits and impairments:  Abnormal gait, Decreased balance, Decreased strength, Hypomobility, Pain  Visit Diagnosis: Pain in right  hip     Problem List Patient Active Problem List   Diagnosis Date Noted  . HBP (high blood pressure) 11/16/2015    Karinda Cabriales PT DPT 04/04/2016, 12:33 PM  [facility] [address] [place], [address] Phone: [phone]   Fax:  [phone]  Name: Brendan Nguyen MRN: [id number] Date of Birth: [birth year]

## 2016-04-06 ENCOUNTER — Encounter: Payer: Self-pay | Admitting: Podiatry

## 2016-04-06 ENCOUNTER — Ambulatory Visit (INDEPENDENT_AMBULATORY_CARE_PROVIDER_SITE_OTHER): Payer: PPO | Admitting: Podiatry

## 2016-04-06 DIAGNOSIS — L603 Nail dystrophy: Secondary | ICD-10-CM | POA: Diagnosis not present

## 2016-04-06 DIAGNOSIS — B351 Tinea unguium: Secondary | ICD-10-CM

## 2016-04-06 DIAGNOSIS — M79676 Pain in unspecified toe(s): Secondary | ICD-10-CM | POA: Diagnosis not present

## 2016-04-06 MED ORDER — TERBINAFINE HCL 250 MG PO TABS: 250.0000 mg | ORAL_TABLET | Freq: Every day | ORAL | Status: DC

## 2016-04-06 NOTE — Progress Notes (Signed)
He presents today after completing an every other day pulse dose of Lamisil over the past 60 days. He states that his toenails are looking much better and they also need to be trimmed because they're long and painful.  Objective: Vital signs are stable alert and oriented 3. Pulses are palpable bilateral. Toenails are thick dystrophic and onychomycosis appears to be resolving.  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  Plan: Debrided toenails bilaterally today and prescribed him another pulse dose of Lamisil. Follow up with him in 3-4 months.

## 2016-04-25 ENCOUNTER — Ambulatory Visit: Payer: PPO | Admitting: Physical Therapy

## 2016-05-04 DIAGNOSIS — E291 Testicular hypofunction: Secondary | ICD-10-CM | POA: Diagnosis not present

## 2016-05-04 DIAGNOSIS — N401 Enlarged prostate with lower urinary tract symptoms: Secondary | ICD-10-CM | POA: Diagnosis not present

## 2016-05-04 DIAGNOSIS — N5201 Erectile dysfunction due to arterial insufficiency: Secondary | ICD-10-CM | POA: Diagnosis not present

## 2016-05-04 DIAGNOSIS — R350 Frequency of micturition: Secondary | ICD-10-CM | POA: Diagnosis not present

## 2016-05-05 DIAGNOSIS — N401 Enlarged prostate with lower urinary tract symptoms: Secondary | ICD-10-CM | POA: Diagnosis not present

## 2016-05-05 DIAGNOSIS — E6609 Other obesity due to excess calories: Secondary | ICD-10-CM | POA: Diagnosis not present

## 2016-05-05 DIAGNOSIS — R351 Nocturia: Secondary | ICD-10-CM | POA: Diagnosis not present

## 2016-05-05 DIAGNOSIS — N5201 Erectile dysfunction due to arterial insufficiency: Secondary | ICD-10-CM | POA: Diagnosis not present

## 2016-05-05 DIAGNOSIS — E291 Testicular hypofunction: Secondary | ICD-10-CM | POA: Diagnosis not present

## 2016-05-05 DIAGNOSIS — R350 Frequency of micturition: Secondary | ICD-10-CM | POA: Diagnosis not present

## 2016-06-04 DIAGNOSIS — J209 Acute bronchitis, unspecified: Secondary | ICD-10-CM | POA: Diagnosis not present

## 2016-06-29 DIAGNOSIS — E781 Pure hyperglyceridemia: Secondary | ICD-10-CM | POA: Diagnosis not present

## 2016-06-29 DIAGNOSIS — R42 Dizziness and giddiness: Secondary | ICD-10-CM | POA: Diagnosis not present

## 2016-06-29 DIAGNOSIS — I10 Essential (primary) hypertension: Secondary | ICD-10-CM | POA: Diagnosis not present

## 2016-06-29 DIAGNOSIS — N183 Chronic kidney disease, stage 3 (moderate): Secondary | ICD-10-CM | POA: Diagnosis not present

## 2016-07-01 DIAGNOSIS — Z0000 Encounter for general adult medical examination without abnormal findings: Secondary | ICD-10-CM | POA: Diagnosis not present

## 2016-07-05 DIAGNOSIS — I10 Essential (primary) hypertension: Secondary | ICD-10-CM | POA: Diagnosis not present

## 2016-07-05 DIAGNOSIS — E781 Pure hyperglyceridemia: Secondary | ICD-10-CM | POA: Diagnosis not present

## 2016-07-06 ENCOUNTER — Ambulatory Visit: Payer: PPO | Admitting: Podiatry

## 2016-07-26 DIAGNOSIS — Z87898 Personal history of other specified conditions: Secondary | ICD-10-CM | POA: Diagnosis not present

## 2016-07-26 DIAGNOSIS — E781 Pure hyperglyceridemia: Secondary | ICD-10-CM | POA: Diagnosis not present

## 2016-07-26 DIAGNOSIS — N183 Chronic kidney disease, stage 3 (moderate): Secondary | ICD-10-CM | POA: Diagnosis not present

## 2016-08-21 ENCOUNTER — Other Ambulatory Visit: Payer: Self-pay | Admitting: Family Medicine

## 2016-08-21 DIAGNOSIS — I10 Essential (primary) hypertension: Secondary | ICD-10-CM

## 2016-08-25 DIAGNOSIS — R278 Other lack of coordination: Secondary | ICD-10-CM | POA: Insufficient documentation

## 2016-08-25 DIAGNOSIS — N289 Disorder of kidney and ureter, unspecified: Secondary | ICD-10-CM | POA: Diagnosis not present

## 2016-08-25 DIAGNOSIS — G5792 Unspecified mononeuropathy of left lower limb: Secondary | ICD-10-CM | POA: Diagnosis not present

## 2016-08-25 DIAGNOSIS — E781 Pure hyperglyceridemia: Secondary | ICD-10-CM | POA: Diagnosis not present

## 2016-08-25 DIAGNOSIS — G5791 Unspecified mononeuropathy of right lower limb: Secondary | ICD-10-CM | POA: Diagnosis not present

## 2016-08-25 DIAGNOSIS — M79606 Pain in leg, unspecified: Secondary | ICD-10-CM | POA: Diagnosis not present

## 2016-08-25 DIAGNOSIS — R2243 Localized swelling, mass and lump, lower limb, bilateral: Secondary | ICD-10-CM | POA: Diagnosis not present

## 2016-08-30 DIAGNOSIS — R278 Other lack of coordination: Secondary | ICD-10-CM | POA: Diagnosis not present

## 2016-08-30 DIAGNOSIS — M79606 Pain in leg, unspecified: Secondary | ICD-10-CM | POA: Diagnosis not present

## 2016-08-30 DIAGNOSIS — R2243 Localized swelling, mass and lump, lower limb, bilateral: Secondary | ICD-10-CM | POA: Diagnosis not present

## 2016-08-30 DIAGNOSIS — R7309 Other abnormal glucose: Secondary | ICD-10-CM | POA: Diagnosis not present

## 2016-09-05 DIAGNOSIS — B078 Other viral warts: Secondary | ICD-10-CM | POA: Diagnosis not present

## 2016-09-05 DIAGNOSIS — L570 Actinic keratosis: Secondary | ICD-10-CM | POA: Diagnosis not present

## 2016-09-05 DIAGNOSIS — L578 Other skin changes due to chronic exposure to nonionizing radiation: Secondary | ICD-10-CM | POA: Diagnosis not present

## 2016-09-05 DIAGNOSIS — L821 Other seborrheic keratosis: Secondary | ICD-10-CM | POA: Diagnosis not present

## 2016-09-05 DIAGNOSIS — L820 Inflamed seborrheic keratosis: Secondary | ICD-10-CM | POA: Diagnosis not present

## 2016-09-05 DIAGNOSIS — D485 Neoplasm of uncertain behavior of skin: Secondary | ICD-10-CM | POA: Diagnosis not present

## 2016-09-08 ENCOUNTER — Emergency Department
Admission: EM | Admit: 2016-09-08 | Discharge: 2016-09-08 | Disposition: A | Discharge status: Home / Self Care | Payer: PPO | Attending: Emergency Medicine | Admitting: Emergency Medicine

## 2016-09-08 ENCOUNTER — Encounter: Payer: Self-pay | Admitting: *Deleted

## 2016-09-08 DIAGNOSIS — Z7982 Long term (current) use of aspirin: Secondary | ICD-10-CM | POA: Diagnosis not present

## 2016-09-08 DIAGNOSIS — I10 Essential (primary) hypertension: Secondary | ICD-10-CM | POA: Insufficient documentation

## 2016-09-08 DIAGNOSIS — G459 Transient cerebral ischemic attack, unspecified: Secondary | ICD-10-CM | POA: Diagnosis not present

## 2016-09-08 DIAGNOSIS — R410 Disorientation, unspecified: Secondary | ICD-10-CM

## 2016-09-08 DIAGNOSIS — R42 Dizziness and giddiness: Secondary | ICD-10-CM | POA: Diagnosis not present

## 2016-09-08 HISTORY — DX: Transient cerebral ischemic attack, unspecified: G45.9

## 2016-09-08 LAB — BASIC METABOLIC PANEL
Anion gap: 10 (ref 5–15)
BUN: 28 mg/dL — AB (ref 6–20)
CALCIUM: 10 mg/dL (ref 8.9–10.3)
CHLORIDE: 104 mmol/L (ref 101–111)
CO2: 22 mmol/L (ref 22–32)
CREATININE: 1.37 mg/dL — AB (ref 0.61–1.24)
GFR calc non Af Amer: 49 mL/min — ABNORMAL LOW (ref >60)
GFR, EST AFRICAN AMERICAN: 56 mL/min — AB (ref >60)
Glucose, Bld: 98 mg/dL (ref 65–99)
Potassium: 4.1 mmol/L (ref 3.5–5.1)
SODIUM: 136 mmol/L (ref 135–145)

## 2016-09-08 LAB — CBC
HCT: 46.2 % (ref 40.0–52.0)
Hemoglobin: 16.1 g/dL (ref 13.0–18.0)
MCH: 31.2 pg (ref 26.0–34.0)
MCHC: 34.7 g/dL (ref 32.0–36.0)
MCV: 89.9 fL (ref 80.0–100.0)
PLATELETS: 195 10*3/uL (ref 150–440)
RBC: 5.14 MIL/uL (ref 4.40–5.90)
RDW: 14.3 % (ref 11.5–14.5)
WBC: 10.3 10*3/uL (ref 3.8–10.6)

## 2016-09-08 LAB — GLUCOSE, CAPILLARY: GLUCOSE-CAPILLARY: 103 mg/dL — AB (ref 65–99)
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
## 2016-09-12 DIAGNOSIS — E669 Obesity, unspecified: Secondary | ICD-10-CM | POA: Diagnosis not present

## 2016-09-12 DIAGNOSIS — R2243 Localized swelling, mass and lump, lower limb, bilateral: Secondary | ICD-10-CM | POA: Diagnosis not present

## 2016-09-12 DIAGNOSIS — R410 Disorientation, unspecified: Secondary | ICD-10-CM | POA: Diagnosis not present

## 2016-09-12 DIAGNOSIS — R278 Other lack of coordination: Secondary | ICD-10-CM | POA: Diagnosis not present

## 2016-09-12 DIAGNOSIS — E781 Pure hyperglyceridemia: Secondary | ICD-10-CM | POA: Diagnosis not present

## 2016-09-12 DIAGNOSIS — N289 Disorder of kidney and ureter, unspecified: Secondary | ICD-10-CM | POA: Diagnosis not present

## 2016-09-12 DIAGNOSIS — G5791 Unspecified mononeuropathy of right lower limb: Secondary | ICD-10-CM | POA: Diagnosis not present

## 2016-09-12 DIAGNOSIS — R42 Dizziness and giddiness: Secondary | ICD-10-CM | POA: Diagnosis not present

## 2016-09-12 DIAGNOSIS — G5792 Unspecified mononeuropathy of left lower limb: Secondary | ICD-10-CM | POA: Diagnosis not present

## 2016-09-19 ENCOUNTER — Other Ambulatory Visit: Payer: Self-pay | Admitting: Neurology

## 2016-09-19 DIAGNOSIS — E781 Pure hyperglyceridemia: Secondary | ICD-10-CM | POA: Diagnosis not present

## 2016-09-19 DIAGNOSIS — R410 Disorientation, unspecified: Secondary | ICD-10-CM

## 2016-09-19 DIAGNOSIS — R42 Dizziness and giddiness: Secondary | ICD-10-CM

## 2016-09-21 ENCOUNTER — Ambulatory Visit
Admission: RE | Admit: 2016-09-21 | Discharge: 2016-09-21 | Disposition: A | Discharge status: Home / Self Care | Payer: PPO | Source: Ambulatory Visit | Attending: Neurology | Admitting: Neurology

## 2016-09-21 DIAGNOSIS — R410 Disorientation, unspecified: Secondary | ICD-10-CM | POA: Diagnosis not present

## 2016-09-21 DIAGNOSIS — G319 Degenerative disease of nervous system, unspecified: Secondary | ICD-10-CM | POA: Insufficient documentation

## 2016-09-21 DIAGNOSIS — R42 Dizziness and giddiness: Secondary | ICD-10-CM | POA: Insufficient documentation

## 2016-09-26 DIAGNOSIS — E781 Pure hyperglyceridemia: Secondary | ICD-10-CM | POA: Diagnosis not present

## 2016-09-26 DIAGNOSIS — N183 Chronic kidney disease, stage 3 (moderate): Secondary | ICD-10-CM | POA: Diagnosis not present

## 2016-09-26 DIAGNOSIS — E559 Vitamin D deficiency, unspecified: Secondary | ICD-10-CM | POA: Diagnosis not present

## 2016-09-29 ENCOUNTER — Ambulatory Visit: Payer: PPO

## 2016-09-29 DIAGNOSIS — R42 Dizziness and giddiness: Secondary | ICD-10-CM | POA: Diagnosis not present

## 2016-09-29 DIAGNOSIS — G5791 Unspecified mononeuropathy of right lower limb: Secondary | ICD-10-CM | POA: Diagnosis not present

## 2016-09-29 DIAGNOSIS — R2243 Localized swelling, mass and lump, lower limb, bilateral: Secondary | ICD-10-CM | POA: Diagnosis not present

## 2016-09-29 DIAGNOSIS — E781 Pure hyperglyceridemia: Secondary | ICD-10-CM | POA: Diagnosis not present

## 2016-09-29 DIAGNOSIS — M79606 Pain in leg, unspecified: Secondary | ICD-10-CM | POA: Diagnosis not present

## 2016-09-29 DIAGNOSIS — R278 Other lack of coordination: Secondary | ICD-10-CM | POA: Diagnosis not present

## 2016-09-29 DIAGNOSIS — R410 Disorientation, unspecified: Secondary | ICD-10-CM | POA: Diagnosis not present

## 2016-09-29 DIAGNOSIS — E669 Obesity, unspecified: Secondary | ICD-10-CM | POA: Diagnosis not present

## 2016-09-29 DIAGNOSIS — G5792 Unspecified mononeuropathy of left lower limb: Secondary | ICD-10-CM | POA: Diagnosis not present

## 2016-09-29 DIAGNOSIS — N289 Disorder of kidney and ureter, unspecified: Secondary | ICD-10-CM | POA: Diagnosis not present

## 2016-10-03 ENCOUNTER — Ambulatory Visit: Payer: PPO

## 2016-10-17 ENCOUNTER — Encounter: Payer: Self-pay | Admitting: Physical Therapy

## 2016-10-17 ENCOUNTER — Ambulatory Visit: Payer: PPO | Attending: Neurology | Admitting: Physical Therapy

## 2016-10-17 DIAGNOSIS — N183 Chronic kidney disease, stage 3 (moderate): Secondary | ICD-10-CM | POA: Diagnosis not present

## 2016-10-17 DIAGNOSIS — M6281 Muscle weakness (generalized): Secondary | ICD-10-CM

## 2016-10-17 DIAGNOSIS — I10 Essential (primary) hypertension: Secondary | ICD-10-CM | POA: Diagnosis not present

## 2016-10-17 DIAGNOSIS — M25551 Pain in right hip: Secondary | ICD-10-CM | POA: Insufficient documentation

## 2016-10-17 DIAGNOSIS — R262 Difficulty in walking, not elsewhere classified: Secondary | ICD-10-CM | POA: Diagnosis not present

## 2016-10-17 DIAGNOSIS — R2681 Unsteadiness on feet: Secondary | ICD-10-CM

## 2016-10-17 NOTE — Patient Instructions (Addendum)
Body Positioning    Gently lean trunk forward until nose is over toes. Hold __3__ seconds. Return. Repeat __10__ times. Do ___2_ sessions per day.  http://[email or web address].[email or web address]   Copyright  VHI. All rights reserved.  Strengthening: Hip Flexion - Resisted    With tubing around left ankle, anchor behind, bring leg forward, keeping knee straight. Repeat _10___ times per set. Do __1__ sets per session. Do __2__ sessions per day.  http://orth.[email or web address].us/638   Copyright  VHI. All rights reserved.  Strengthening: Hip Extension - Resisted    With tubing around right ankle, face anchor and pull leg straight back. Repeat __10__ times per set. Do 1__ sets per session. Do __2__ sessions per day.  http://orth.exer.us/636   Copyright  VHI. All rights reserved.  Strengthening: Hip Abduction - Resisted    With tubing around right leg, other side toward anchor, extend leg out from side. Repeat _10___ times per set. Do __1__ sets per session. Do ___2_ sessions per day.  http://[email or web address].us/634   Copyright  VHI. All rights reserved.
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
## 2016-10-20 ENCOUNTER — Encounter: Payer: Self-pay | Admitting: Physical Therapy

## 2016-10-20 ENCOUNTER — Encounter: Payer: PPO | Admitting: Physical Therapy

## 2016-10-20 ENCOUNTER — Ambulatory Visit: Payer: PPO | Admitting: Physical Therapy

## 2016-10-20 DIAGNOSIS — M6281 Muscle weakness (generalized): Secondary | ICD-10-CM

## 2016-10-20 DIAGNOSIS — R2681 Unsteadiness on feet: Secondary | ICD-10-CM

## 2016-10-20 DIAGNOSIS — R262 Difficulty in walking, not elsewhere classified: Secondary | ICD-10-CM | POA: Diagnosis not present

## 2016-10-20 NOTE — Therapy (Addendum)
[place] MAIN [facility] SERVICES [address] [place], [place], [address] Phone: [phone]   Fax:  [phone]  Physical Therapy Treatment  Patient Details  Name: Brendan Nguyen MRN: [id number] Date of Birth: March 01, 1941 Referring Provider: Dr. Melrose Nakayama  Encounter Date: 10/20/2016      PT End of Session - 10/24/16 0806    Visit Number 2   Number of Visits 13   Date for PT Re-Evaluation 11/28/16   Authorization Type gcodes    Authorization Time Period 2/10   PT Start Time 1745   PT Stop Time 1830   PT Time Calculation (min) 45 min   Equipment Utilized During Treatment Gait belt   Activity Tolerance Patient tolerated treatment well   Behavior During Therapy [facility] for tasks assessed/performed      Past Medical History:  Diagnosis Date  . Hypertension   . TIA (transient ischemic attack) 2013    Past Surgical History:  Procedure Laterality Date  . L5 lamenectomy      There were no vitals filed for this visit.           TREATMENT:  Nustep x 4 mins with BUEs/BLEs level 3 (unbilled)  4 square stepping forwards/backwards/sideways, 2 sets x 10 reps, CGA for stability, most difficulty with backwards stepping, min VCs for larger steps each direction  Ladder drills  -Reciprocal stepping x 2 laps, CGA for stability, min VCs for larger steps and upright posture  -Sidestepping x 2 laps each direction, CGA for stability, min VCs for increased hip and knee flexion Bosu step taps, 2 sets x 10 reps alternating LEs, no HHA, min VCs for larger step backwards  Bosu step ups, 1 set x 10 reps, min VCs to increase glut activation once standing on bosu ball, pt required 1 HHA to get up onto ball and able to remove and practice static balance once on ball  Steps towards stepping stones standing on airex pad, 2 sets x 10 reps alternating LEs, 1 HHA intermittent no HHA  Ball toss feet together on airex, 2 sets x 10 reps, CGA for stability, min VCs for  greater upright posture  Leg Press, BLEs, #100, 3 sets x 10 reps, min VCs for eccentric control and min VCs for initial positioning                       PT Education - 10/24/16 0806    Education provided Yes   Education Details balance HEP    Person(s) Educated Patient   Methods Explanation;Demonstration;Verbal cues   Comprehension Verbalized understanding;Returned demonstration;Verbal cues required             PT Long Term Goals - 10/18/16 1012      PT LONG TERM GOAL #1   Title Pt will become independent with HEP at home to increase functional independence with all ADLS.    Time 6   Period Weeks   Status New     PT LONG TERM GOAL #2   Title Pt will complete 5 x sit to stand in less than 15 seconds to decrease risk of falls.     Time 6   Period Weeks   Status New     PT LONG TERM GOAL #3   Title Pt will increase self reported LEFS score to 40/80 to decrease perception of disability and increase functional indepedence.     Time 6   Period Weeks   Status New  PT LONG TERM GOAL #4   Title Pt will increase gait speed to 1.31m/s to become a community ambulator and have greater functional mobility.     Time 6   Period Weeks   Status New     PT LONG TERM GOAL #5   Title Pt will self report worse pain of 2/10  in RLE order to improve tolerance and promote greater functional mobility.     Time 6   Period Weeks   Status New     Additional Long Term Goals   Additional Long Term Goals Yes     PT LONG TERM GOAL #6   Title Pt will improve TUG score to <13 seconds to decrease falls risk and promote more functional mobility.     Time 6   Period Weeks   Status New     PT LONG TERM GOAL #7   Title Pt will be able to get up and down from floor mod indep in order to garden at home safely.     Time 6   Period Weeks               Plan - 10/24/16 [id number]    Clinical Impression Statement Pt reports compliance with HEP since first session.  Initated  activites requiring large steps in multiple directions.  Pt required CGA for all stepping strategies and min VCs to increase step length especially backwards.  With continued verbal cues pt was able to demonstrate reciprocal large steps walking around the gym after stepping strategies.  He would benefit from further skilled PT to work towards more functional gait and greater dynamic balance.     Rehab Potential Good   Clinical Impairments Affecting Rehab Potential Positive: motivation, social support, active previously   Negative: chronicity, progression, recent falls, decrease in activity level    clinical impression: evolving    PT Frequency 2x / week   PT Duration 6 weeks   PT Treatment/Interventions Gait training;Therapeutic activities;Therapeutic exercise;Balance training;Neuromuscular re-education;Patient/family education;Stair training;Functional mobility training;Energy conservation;Manual techniques  spinal mobilization/manipulation   PT Next Visit Plan DGI, ladder drills, obstacles etc.     PT Home Exercise Plan sit to stands, 4 way hip, static balance on airex    Consulted and Agree with Plan of Care Patient;Family member/caregiver   Family Member Consulted wife      Patient will benefit from skilled therapeutic intervention in order to improve the following deficits and impairments:  Abnormal gait, Decreased balance, Decreased strength, Hypomobility, Pain, Decreased endurance, Decreased activity tolerance, Decreased mobility  Visit Diagnosis: Difficulty in walking, not elsewhere classified  Unsteadiness on feet  Muscle weakness (generalized)     Problem List Patient Active Problem List   Diagnosis Date Noted  . HBP (high blood pressure) 11/16/2015   Stacy Gardner, SPT  This entire session was performed under direct supervision and direction of a licensed therapist/therapist assistant . I have personally read, edited and approve of the note as written.  Trotter,Margaret  PT, DPT 10/24/2016, 8:27 AM  [place] MAIN [facility] SERVICES [address] [place], [place], [address] Phone: [phone]   Fax:  [phone]  Name: Brendan Nguyen MRN: [id number] Date of Birth: June 04, 1941

## 2016-10-21 DIAGNOSIS — I10 Essential (primary) hypertension: Secondary | ICD-10-CM | POA: Diagnosis not present

## 2016-10-24 ENCOUNTER — Encounter: Payer: Self-pay | Admitting: Physical Therapy

## 2016-10-24 ENCOUNTER — Ambulatory Visit: Payer: PPO | Admitting: Physical Therapy

## 2016-10-24 DIAGNOSIS — R262 Difficulty in walking, not elsewhere classified: Secondary | ICD-10-CM

## 2016-10-24 DIAGNOSIS — R2681 Unsteadiness on feet: Secondary | ICD-10-CM

## 2016-10-24 DIAGNOSIS — M6281 Muscle weakness (generalized): Secondary | ICD-10-CM

## 2016-10-25 ENCOUNTER — Encounter: Payer: Self-pay | Admitting: Physical Therapy

## 2016-10-25 NOTE — Therapy (Signed)
[place] MAIN [facility] SERVICES [address] [place], [place], [address] Phone: [phone]   Fax:  [phone]  Physical Therapy Treatment  Patient Details  Name: Brendan Nguyen MRN: [id number] Date of Birth: 1941-10-01 Referring Provider: Dr. Melrose Nakayama  Encounter Date: 10/24/2016      PT End of Session - 10/25/16 1003    Visit Number 3   Number of Visits 13   Date for PT Re-Evaluation 11/28/16   Authorization Type gcodes    Authorization Time Period 3/10   PT Start Time 1750   PT Stop Time 1830   PT Time Calculation (min) 40 min   Equipment Utilized During Treatment Gait belt   Activity Tolerance Patient tolerated treatment well   Behavior During Therapy [facility] for tasks assessed/performed      Past Medical History:  Diagnosis Date  . Hypertension   . TIA (transient ischemic attack) 2013    Past Surgical History:  Procedure Laterality Date  . L5 lamenectomy      There were no vitals filed for this visit.      Subjective Assessment - 10/24/16 1756    Subjective Pt reports that he danced last night and is feeling a little more sore and stiff today.     Patient is accompained by: Family member  wife   Pertinent History Personal factors affecting rehab: PLOF, active, social support, falls risk, age, progressive symptoms    Limitations Standing;Walking;Lifting   How long can you sit comfortably? n/a    How long can you stand comfortably? 1 hour max    How long can you walk comfortably? 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  Negative for hemorrhage or mass.   Patient Stated Goals get back to the gym, decrease his falls risk    Currently in Pain? No/denies   Pain Onset Other (comment)     Treatment Supine bridges, 2 sets x 10 reps in hooklying, min VCs to put weight through heels to reduce low back discomfort  Sidelying clamshells, 2 sets x 10 reps on both  sides, min VCs for proper positioning and increased abduction  Resisted walking forwards/backwards/sideways with #7.5 x 2 laps in each direction, CGA for stability, min VCs to increase step length and arm swing  Gait training with reciprocal walking using walking sticks for arm swing by SPT-pt ambulated 6 bouts x 21ft, pt simulated reciprocal walking with walking sticks posteriorly, min VCs provided to pt to increase step length and slow steps (gait training 8 mins)  4 square stepping  -Modified lung step laterally, diagonally, and forward leading with each LE, 2 sets x 10 reps, min VCs for explosive step back to starting square,  min VCs to take larger steps and maintain upright posture  Leg Press, 3 sets x 10 reps, #150, min A to place feet on foot plate, min VCs to increase eccentric control and extend legs as far as possible                             PT Education - 10/25/16 1003    Education provided Yes   Education Details addition of HEP    Person(s) Educated Patient   Methods Explanation;Demonstration;Verbal cues   Comprehension Verbalized understanding;Returned demonstration;Verbal cues required             PT Long Term Goals - 10/18/16 1012  PT LONG TERM GOAL #1   Title Pt will become independent with HEP at home to increase functional independence with all ADLS.    Time 6   Period Weeks   Status New     PT LONG TERM GOAL #2   Title Pt will complete 5 x sit to stand in less than 15 seconds to decrease risk of falls.     Time 6   Period Weeks   Status New     PT LONG TERM GOAL #3   Title Pt will increase self reported LEFS score to 40/80 to decrease perception of disability and increase functional indepedence.     Time 6   Period Weeks   Status New     PT LONG TERM GOAL #4   Title Pt will increase gait speed to 1.45m/s to become a community ambulator and have greater functional mobility.     Time 6   Period Weeks   Status New     PT  LONG TERM GOAL #5   Title Pt will self report worse pain of 2/10  in RLE order to improve tolerance and promote greater functional mobility.     Time 6   Period Weeks   Status New     Additional Long Term Goals   Additional Long Term Goals Yes     PT LONG TERM GOAL #6   Title Pt will improve TUG score to <13 seconds to decrease falls risk and promote more functional mobility.     Time 6   Period Weeks   Status New     PT LONG TERM GOAL #7   Title Pt will be able to get up and down from floor mod indep in order to garden at home safely.     Time 6   Period Weeks               Plan - 10/25/16 1003    Clinical Impression Statement Pt demonstrated more foot scuffling today than previously and reports that he is a little sore from dancing the night before.  Continued to work on exercises to promote larger steps and more natural gait pattern.  Pt continues to demonstrate most difficulty with backwards walking and is unable to take larger steps with min VCs.  Pt demonstrated less of a R steppage gait with sidestepping today than previous session.  Bridges and clamshells were added into HEP.  He would continue to benefit from further skilled PT to work on gait mechanics and dynamic balance for more functional mobility.     Rehab Potential Good   Clinical Impairments Affecting Rehab Potential Positive: motivation, social support, active previously   Negative: chronicity, progression, recent falls, decrease in activity level    clinical impression: evolving    PT Frequency 2x / week   PT Duration 6 weeks   PT Treatment/Interventions Gait training;Therapeutic activities;Therapeutic exercise;Balance training;Neuromuscular re-education;Patient/family education;Stair training;Functional mobility training;Energy conservation;Manual techniques  spinal mobilization/manipulation   PT Next Visit Plan ladder drills, obstacles, resisted walking, backwards steps    PT Home Exercise Plan sit to  stands, 4 way hip, static balance on airex, bridges, clamshells     Consulted and Agree with Plan of Care Patient;Family member/caregiver   Family Member Consulted wife      Patient will benefit from skilled therapeutic intervention in order to improve the following deficits and impairments:  Abnormal gait, Decreased balance, Decreased strength, Hypomobility, Pain, Decreased endurance, Decreased activity tolerance, Decreased mobility  Visit  Diagnosis: Difficulty in walking, not elsewhere classified  Unsteadiness on feet  Muscle weakness (generalized)     Problem List Patient Active Problem List   Diagnosis Date Noted  . HBP (high blood pressure) 11/16/2015   Stacy Gardner, SPT  This entire session was performed under direct supervision and direction of a licensed therapist/therapist assistant . I have personally read, edited and approve of the note as written.   Trotter,Margaret PT, DPT 10/25/2016, 10:57 AM  [place] MAIN [facility] SERVICES [address] [place], [place], [address] Phone: [phone]   Fax:  [phone]  Name: EMONTE STRACKE MRN: [id number] Date of Birth: [birth year]

## 2016-10-27 ENCOUNTER — Ambulatory Visit: Payer: PPO

## 2016-10-27 DIAGNOSIS — R278 Other lack of coordination: Secondary | ICD-10-CM | POA: Diagnosis not present

## 2016-10-27 DIAGNOSIS — G5792 Unspecified mononeuropathy of left lower limb: Secondary | ICD-10-CM | POA: Diagnosis not present

## 2016-10-27 DIAGNOSIS — R262 Difficulty in walking, not elsewhere classified: Secondary | ICD-10-CM | POA: Diagnosis not present

## 2016-10-27 DIAGNOSIS — M6281 Muscle weakness (generalized): Secondary | ICD-10-CM

## 2016-10-27 DIAGNOSIS — R2681 Unsteadiness on feet: Secondary | ICD-10-CM

## 2016-10-27 DIAGNOSIS — R42 Dizziness and giddiness: Secondary | ICD-10-CM | POA: Diagnosis not present

## 2016-10-27 DIAGNOSIS — G5791 Unspecified mononeuropathy of right lower limb: Secondary | ICD-10-CM | POA: Diagnosis not present

## 2016-10-27 DIAGNOSIS — R410 Disorientation, unspecified: Secondary | ICD-10-CM | POA: Diagnosis not present

## 2016-10-27 NOTE — Therapy (Signed)
[place] MAIN [facility] SERVICES [address] [place], [place], [address] Phone: [phone]   Fax:  [phone]  Physical Therapy Treatment  Patient Details  Name: Brendan Nguyen MRN: [id number] Date of Birth: Aug 16, 1941 Referring Provider: Dr. Melrose Nakayama  Encounter Date: 10/27/2016      PT End of Session - 10/27/16 1746    Visit Number 4   Number of Visits 13   Date for PT Re-Evaluation 11/28/16   Authorization Type gcodes    Authorization Time Period 4/10   PT Start Time 1647   PT Stop Time 1732   PT Time Calculation (min) 45 min   Equipment Utilized During Treatment Gait belt   Activity Tolerance Patient tolerated treatment well   Behavior During Therapy [facility] for tasks assessed/performed      Past Medical History:  Diagnosis Date  . Hypertension   . TIA (transient ischemic attack) 2013    Past Surgical History:  Procedure Laterality Date  . L5 lamenectomy      There were no vitals filed for this visit.      Subjective Assessment - 10/27/16 1651    Subjective Pt reports that he's having difficulty getting in and out of the chair. States the physician is doubling parkinsons medication.    Patient is accompained by: Family member  wife   Pertinent History Personal factors affecting rehab: PLOF, active, social support, falls risk, age, progressive symptoms    Limitations Standing;Walking;Lifting   How long can you sit comfortably? n/a    How long can you stand comfortably? 1 hour max    How long can you walk comfortably? 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  Negative for hemorrhage or mass.   Patient Stated Goals get back to the gym, decrease his falls risk    Currently in Pain? No/denies   Pain Onset Other (comment)      Treatment Sit to stands with cueing on knee and hip position - x 10  Cone taps B on airex pad - x 15 with unilateral UE support   Straight step ups onto Bosu ball with UE support - x10 B Resisted walking forwards/backwards/sideways with agility ladder, CGA for stability, min VCs to increase step length and arm swing - x2 up and down the agility ladder Side stepping up and over airex pad - x 10 B with unilateral UE support Feet together balance performing horizontal shoulder abduction/adduction on airex - x10 Feet together balance performing shoulder flexion/ext on airex - x 10       PT Education - 10/27/16 1746    Education provided Yes   Education Details Educated to perform side stepping up and over airex pad at home and add to [employer]) Educated Patient   Methods Explanation;Demonstration   Comprehension Returned demonstration;Verbalized understanding             PT Long Term Goals - 10/18/16 1012      PT LONG TERM GOAL #1   Title Pt will become independent with HEP at home to increase functional independence with all ADLS.    Time 6   Period Weeks   Status New     PT LONG TERM GOAL #2   Title Pt will complete 5 x sit to stand in less than 15 seconds to decrease risk of falls.     Time 6   Period Weeks   Status New  PT LONG TERM GOAL #3   Title Pt will increase self reported LEFS score to 40/80 to decrease perception of disability and increase functional indepedence.     Time 6   Period Weeks   Status New     PT LONG TERM GOAL #4   Title Pt will increase gait speed to 1.42m/s to become a community ambulator and have greater functional mobility.     Time 6   Period Weeks   Status New     PT LONG TERM GOAL #5   Title Pt will self report worse pain of 2/10  in RLE order to improve tolerance and promote greater functional mobility.     Time 6   Period Weeks   Status New     Additional Long Term Goals   Additional Long Term Goals Yes     PT LONG TERM GOAL #6   Title Pt will improve TUG score to <13 seconds to decrease falls risk and promote more functional mobility.     Time 6    Period Weeks   Status New     PT LONG TERM GOAL #7   Title Pt will be able to get up and down from floor mod indep in order to garden at home safely.     Time 6   Period Weeks               Plan - 10/27/16 1747    Clinical Impression Statement Pt demonstrates decreased step length bilaterally requiring frequent cueing to increase step length and to stay on task with exercises. Focused on performing side stepping, backwards stepping, and activities that require large movements to complete. Patient demonstrates improved success with movements after performing several reps indicating carryover between exercises. Patient will benefit from further skilled therapy focused on improving step length in multiple directions and improving dynamic balance with exercise.    Rehab Potential Good   Clinical Impairments Affecting Rehab Potential Positive: motivation, social support, active previously   Negative: chronicity, progression, recent falls, decrease in activity level    clinical impression: evolving    PT Frequency 2x / week   PT Duration 6 weeks   PT Treatment/Interventions Gait training;Therapeutic activities;Therapeutic exercise;Balance training;Neuromuscular re-education;Patient/family education;Stair training;Functional mobility training;Energy conservation;Manual techniques  spinal mobilization/manipulation   PT Next Visit Plan ladder drills, obstacles, resisted walking, backwards steps    PT Home Exercise Plan sit to stands, 4 way hip, static balance on airex, bridges, clamshells     Consulted and Agree with Plan of Care Patient;Family member/caregiver   Family Member Consulted wife      Patient will benefit from skilled therapeutic intervention in order to improve the following deficits and impairments:  Abnormal gait, Decreased balance, Decreased strength, Hypomobility, Pain, Decreased endurance, Decreased activity tolerance, Decreased mobility  Visit Diagnosis: Difficulty in  walking, not elsewhere classified  Unsteadiness on feet  Muscle weakness (generalized)     Problem List Patient Active Problem List   Diagnosis Date Noted  . HBP (high blood pressure) 11/16/2015    Blythe Stanford, PT DPT  10/27/2016, 5:50 PM  [place] MAIN [facility] SERVICES [address] [place], [place], [address] Phone: [phone]   Fax:  [phone]  Name: Brendan Nguyen MRN: [id number] Date of Birth: 03/26/1941

## 2016-10-31 ENCOUNTER — Encounter: Payer: PPO | Admitting: Physical Therapy

## 2016-11-02 ENCOUNTER — Encounter: Payer: Self-pay | Admitting: Physical Therapy

## 2016-11-02 ENCOUNTER — Ambulatory Visit: Payer: PPO | Admitting: Physical Therapy

## 2016-11-02 DIAGNOSIS — R262 Difficulty in walking, not elsewhere classified: Secondary | ICD-10-CM

## 2016-11-02 DIAGNOSIS — M6281 Muscle weakness (generalized): Secondary | ICD-10-CM

## 2016-11-02 DIAGNOSIS — R2681 Unsteadiness on feet: Secondary | ICD-10-CM

## 2016-11-02 NOTE — Therapy (Signed)
[place] MAIN [facility] SERVICES [address] [place], [place], [address] Phone: [phone]   Fax:  [phone]  Physical Therapy Treatment  Patient Details  Name: Brendan Nguyen MRN: [id number] Date of Birth: 05/13/1941 Referring Provider: Dr. Melrose Nakayama  Encounter Date: 11/02/2016      PT End of Session - 11/02/16 1451    Visit Number 5   Number of Visits 13   Date for PT Re-Evaluation 11/28/16   Authorization Type gcodes    Authorization Time Period 5/10   PT Start Time 1345   PT Stop Time 1440   PT Time Calculation (min) 55 min   Activity Tolerance Patient tolerated treatment well   Behavior During Therapy [facility] for tasks assessed/performed      Past Medical History:  Diagnosis Date  . Hypertension   . TIA (transient ischemic attack) 2013    Past Surgical History:  Procedure Laterality Date  . L5 lamenectomy      There were no vitals filed for this visit.      Subjective Assessment - 11/02/16 1450    Subjective Patient reports that he is still having trouble with motor planning. He is still having difficulties with shuffled steps and with movement. Patient reports having to cancel earlier this week due to low blood sugar and having an episode where he fainted at work. Patient states that he is trying to eat more regularly for less glucose issues. denies any pain;    Patient is accompained by: Family member  wife   Pertinent History Personal factors affecting rehab: PLOF, active, social support, falls risk, age, progressive symptoms    Limitations Standing;Walking;Lifting   How long can you sit comfortably? n/a    How long can you stand comfortably? 1 hour max    How long can you walk comfortably? 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  Negative for hemorrhage or mass.   Patient Stated Goals get back to the gym, decrease his falls risk    Currently in Pain?  No/denies   Pain Onset Other (comment)      TREATMENT: PT instructed patient in seated and standing PWR exercise. Seated PWR up, twist, rock and step 2x10 reps with max VCs and demonstration for correct technique; patient instructed to assist with counting for improved cognition; Also educated with multiple boots including open hands, looking towards ceiling or facing hands, increase loudness of clap or stomp for better mobility and to improve neuro plasticity.  Standing PWR exercise x10 each; Patient had increased difficulty with power up with getting into good squat position; added chair behind patient for tactile cues for sit<>stand. Patient also instructed in standing PWR rock and twist with boosts including open hands, increase look towards target and to improve weight shift. Patient had increased difficulty with standing PWR step; Instructed patient to use counter for balance and to really focus on pushing through BLE for better PWR step;  Patient and wife educated in all exercise; Provided handout for better HEP compliance;  HEP instruction to perform seated and standing PWR x10, 2x a day;                            PT Education - 11/02/16 1451    Education provided Yes   Education Details PWR exercise, HEP   Person(s) Educated Patient   Methods Explanation;Demonstration;Tactile cues;Verbal cues   Comprehension  Verbalized understanding;Returned demonstration;Verbal cues required;Tactile cues required             PT Long Term Goals - 10/18/16 1012      PT LONG TERM GOAL #1   Title Pt will become independent with HEP at home to increase functional independence with all ADLS.    Time 6   Period Weeks   Status New     PT LONG TERM GOAL #2   Title Pt will complete 5 x sit to stand in less than 15 seconds to decrease risk of falls.     Time 6   Period Weeks   Status New     PT LONG TERM GOAL #3   Title Pt will increase self reported LEFS score to  40/80 to decrease perception of disability and increase functional indepedence.     Time 6   Period Weeks   Status New     PT LONG TERM GOAL #4   Title Pt will increase gait speed to 1.69m/s to become a community ambulator and have greater functional mobility.     Time 6   Period Weeks   Status New     PT LONG TERM GOAL #5   Title Pt will self report worse pain of 2/10  in RLE order to improve tolerance and promote greater functional mobility.     Time 6   Period Weeks   Status New     Additional Long Term Goals   Additional Long Term Goals Yes     PT LONG TERM GOAL #6   Title Pt will improve TUG score to <13 seconds to decrease falls risk and promote more functional mobility.     Time 6   Period Weeks   Status New     PT LONG TERM GOAL #7   Title Pt will be able to get up and down from floor mod indep in order to garden at home safely.     Time 6   Period Weeks               Plan - 11/02/16 1451    Clinical Impression Statement Patient reports frustrating on difficulty with ambulation and with motor planning. He reports that his doctors are trying to rule out Parkinson's disease and have started him on levadopa to see his response. Patient was instructed in seated and standing PWR exercise to improve joint mobility, strength, posture and overall coordination. He required frequent cues and demonstration for correct technique. Patient educated in ways to improve movement patterns and reduce shuffling in home. He would benefit from additional skilled PT intervention to improve mobility and reduce fall risk;    Rehab Potential Good   Clinical Impairments Affecting Rehab Potential Positive: motivation, social support, active previously   Negative: chronicity, progression, recent falls, decrease in activity level    clinical impression: evolving    PT Frequency 2x / week   PT Duration 6 weeks   PT Treatment/Interventions Gait training;Therapeutic activities;Therapeutic  exercise;Balance training;Neuromuscular re-education;Patient/family education;Stair training;Functional mobility training;Energy conservation;Manual techniques  spinal mobilization/manipulation   PT Next Visit Plan ladder drills, obstacles, resisted walking, backwards steps    PT Home Exercise Plan sit to stands, 4 way hip, static balance on airex, bridges, clamshells     Consulted and Agree with Plan of Care Patient;Family member/caregiver   Family Member Consulted wife      Patient will benefit from skilled therapeutic intervention in order to improve the following deficits and impairments:  Abnormal gait, Decreased balance, Decreased strength, Hypomobility, Pain, Decreased endurance, Decreased activity tolerance, Decreased mobility  Visit Diagnosis: Difficulty in walking, not elsewhere classified  Unsteadiness on feet  Muscle weakness (generalized)     Problem List Patient Active Problem List   Diagnosis Date Noted  . HBP (high blood pressure) 11/16/2015    Ryan Ogborn PT, DPT 11/02/2016, 2:54 PM  [place] MAIN [facility] SERVICES [address] [place], [place], [address] Phone: [phone]   Fax:  [phone]  Name: Brendan Nguyen MRN: [id number] Date of Birth: 04-May-1941

## 2016-11-07 ENCOUNTER — Encounter: Payer: Self-pay | Admitting: Physical Therapy

## 2016-11-07 ENCOUNTER — Ambulatory Visit: Payer: PPO | Admitting: Physical Therapy

## 2016-11-07 DIAGNOSIS — M25551 Pain in right hip: Secondary | ICD-10-CM

## 2016-11-07 DIAGNOSIS — R262 Difficulty in walking, not elsewhere classified: Secondary | ICD-10-CM

## 2016-11-07 DIAGNOSIS — M6281 Muscle weakness (generalized): Secondary | ICD-10-CM

## 2016-11-07 DIAGNOSIS — R2681 Unsteadiness on feet: Secondary | ICD-10-CM

## 2016-11-07 NOTE — Therapy (Signed)
[place] MAIN [facility] SERVICES [address] [place], [place], [address] Phone: [phone]   Fax:  [phone]  Physical Therapy Treatment  Patient Details  Name: Brendan Nguyen MRN: [id number] Date of Birth: [birth year] Referring Provider: Dr. Melrose Nakayama  Encounter Date: 11/07/2016      PT End of Session - 11/07/16 1439    Visit Number 6   Number of Visits 13   Date for PT Re-Evaluation 11/28/16   Authorization Type gcodes    Authorization Time Period 6/10   PT Start Time 1345   PT Stop Time 1430   PT Time Calculation (min) 45 min   Equipment Utilized During Treatment Gait belt   Activity Tolerance Patient tolerated treatment well   Behavior During Therapy [facility] for tasks assessed/performed      Past Medical History:  Diagnosis Date  . Hypertension   . TIA (transient ischemic attack) 2013    Past Surgical History:  Procedure Laterality Date  . L5 lamenectomy      There were no vitals filed for this visit.      Subjective Assessment - 11/07/16 1347    Subjective Patient reports doing okay today; He reports doing the exercises at least 1x a day. sometimes he would do them 2x a day but not often;    Patient is accompained by: Family member  wife   Pertinent History Personal factors affecting rehab: PLOF, active, social support, falls risk, age, progressive symptoms    Limitations Standing;Walking;Lifting   How long can you sit comfortably? n/a    How long can you stand comfortably? 1 hour max    How long can you walk comfortably? 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  Negative for hemorrhage or mass.   Patient Stated Goals get back to the gym, decrease his falls risk    Currently in Pain? No/denies   Pain Onset Other (comment)          TREATMENT: Gait on treadmill 0.8-1.1 mph with 2 HHA x2 min, x1 min with max VCs and demonstration to improve posture and  positioning; demonstrates forward lean with decreased step length; PT applied pball in front of treadmill as a tactile cues to kick ball for better step length;  PT instructed patient in seated  PWR exercise. Seated PWR up, twist, rock and step x10 reps with max VCs and demonstration for correct technique; patient instructed to assist with counting for improved cognition; Also educated with multiple boosts including open hands, looking towards ceiling or facing hands, increase loudness of clap or stomp for better mobility and to improve neuro plasticity. Instructed patient in seated PWR flow x5 reps with max VCs and demonstration for tempo, increased ROM, better positioning; Patient fatigues quickly and had significant difficulty staying with tempo;   Instructed patient supine PWR exercise, 2x10 reps; Patient required max VCs and demonstration for correct technique; He was able to demonstrate better rock and twist after a few reps with better flexibility;  Patient ambulated with better thoracic mobility and arm swing after supine exercise, demonstrating better reciprocal gait; Forward/backward walking x10 feet x4 reps with cues to improve trunk positioning;    Patient and wife educated in all exercise; Provided handout for better HEP compliance;  HEP instruction to perform seated and standing PWR x10, 2x a day;  PT Education - 11/07/16 1431    Education provided Yes   Education Details PWR exercise, HEP   Person(s) Educated Patient;Spouse   Methods Explanation;Demonstration;Verbal cues   Comprehension Verbalized understanding;Returned demonstration;Verbal cues required             PT Long Term Goals - 10/18/16 1012      PT LONG TERM GOAL #1   Title Pt will become independent with HEP at home to increase functional independence with all ADLS.    Time 6   Period Weeks   Status New     PT LONG TERM GOAL #2   Title Pt will complete 5 x  sit to stand in less than 15 seconds to decrease risk of falls.     Time 6   Period Weeks   Status New     PT LONG TERM GOAL #3   Title Pt will increase self reported LEFS score to 40/80 to decrease perception of disability and increase functional indepedence.     Time 6   Period Weeks   Status New     PT LONG TERM GOAL #4   Title Pt will increase gait speed to 1.53m/s to become a community ambulator and have greater functional mobility.     Time 6   Period Weeks   Status New     PT LONG TERM GOAL #5   Title Pt will self report worse pain of 2/10  in RLE order to improve tolerance and promote greater functional mobility.     Time 6   Period Weeks   Status New     Additional Long Term Goals   Additional Long Term Goals Yes     PT LONG TERM GOAL #6   Title Pt will improve TUG score to <13 seconds to decrease falls risk and promote more functional mobility.     Time 6   Period Weeks   Status New     PT LONG TERM GOAL #7   Title Pt will be able to get up and down from floor mod indep in order to garden at home safely.     Time 6   Period Weeks               Plan - 11/07/16 1439    Clinical Impression Statement Patient had significant trouble with gait training on treadmill. He had trouble with erect posture and increase LE weight bearing. Patient instructed in advanced PWR exercise. He continues to require frequent cues for correct technique; He was able to demonstrate better reciprocal gait pattern after supine exercise with improved trunk mobility; Patient would benefit from additional skilled PT intervention to improve LE strength, balance and gait safety;    Rehab Potential Good   Clinical Impairments Affecting Rehab Potential Positive: motivation, social support, active previously   Negative: chronicity, progression, recent falls, decrease in activity level    clinical impression: evolving    PT Frequency 2x / week   PT Duration 6 weeks   PT Treatment/Interventions  Gait training;Therapeutic activities;Therapeutic exercise;Balance training;Neuromuscular re-education;Patient/family education;Stair training;Functional mobility training;Energy conservation;Manual techniques  spinal mobilization/manipulation   PT Next Visit Plan ladder drills, obstacles, resisted walking, backwards steps    PT Home Exercise Plan sit to stands, 4 way hip, static balance on airex, bridges, clamshells     Consulted and Agree with Plan of Care Patient;Family member/caregiver   Family Member Consulted wife      Patient will benefit from skilled therapeutic intervention in order  to improve the following deficits and impairments:  Abnormal gait, Decreased balance, Decreased strength, Hypomobility, Pain, Decreased endurance, Decreased activity tolerance, Decreased mobility  Visit Diagnosis: Difficulty in walking, not elsewhere classified  Unsteadiness on feet  Muscle weakness (generalized)  Pain in right hip     Problem List Patient Active Problem List   Diagnosis Date Noted  . HBP (high blood pressure) 11/16/2015    Trotter,Margaret PT, DPT 11/07/2016, 3:03 PM  [place] MAIN [facility] SERVICES [address] [place], [place], [address] Phone: [phone]   Fax:  [phone]  Name: Brendan Nguyen MRN: [id number] Date of Birth: Jun 25, 1941

## 2016-11-08 DIAGNOSIS — N183 Chronic kidney disease, stage 3 (moderate): Secondary | ICD-10-CM | POA: Diagnosis not present

## 2016-11-10 ENCOUNTER — Encounter: Payer: Self-pay | Admitting: Physical Therapy

## 2016-11-10 ENCOUNTER — Ambulatory Visit: Payer: PPO | Admitting: Physical Therapy

## 2016-11-10 DIAGNOSIS — R2681 Unsteadiness on feet: Secondary | ICD-10-CM

## 2016-11-10 DIAGNOSIS — R262 Difficulty in walking, not elsewhere classified: Secondary | ICD-10-CM

## 2016-11-10 DIAGNOSIS — M6281 Muscle weakness (generalized): Secondary | ICD-10-CM

## 2016-11-10 NOTE — Therapy (Addendum)
[place] MAIN [facility] SERVICES [address] [place], [place], [address] Phone: [phone]   Fax:  [phone]  Physical Therapy Treatment  Patient Details  Name: Brendan Nguyen MRN: [id number] Date of Birth: 1941-06-05 Referring Provider: Dr. Melrose Nakayama  Encounter Date: 11/10/2016      PT End of Session - 11/10/16 1844    Visit Number 7   Number of Visits 13   Date for PT Re-Evaluation 11/28/16   Authorization Type gcodes    Authorization Time Period 7/10   PT Start Time 1745   PT Stop Time 1840   PT Time Calculation (min) 55 min   Activity Tolerance Patient tolerated treatment well   Behavior During Therapy [employer] [facility] [employer] for tasks assessed/performed      Past Medical History:  Diagnosis Date  . Hypertension   . TIA (transient ischemic attack) 2013    Past Surgical History:  Procedure Laterality Date  . L5 lamenectomy      There were no vitals filed for this visit.      Subjective Assessment - 11/10/16 1756    Subjective Patient reports doing well today; He reports, "I am walking a little slower today. I had my took extracted yesterday and I don't know if that has me messed up a little today."    Patient is accompained by: Family member  wife   Pertinent History Personal factors affecting rehab: PLOF, active, social support, falls risk, age, progressive symptoms    Limitations Standing;Walking;Lifting   How long can you sit comfortably? n/a    How long can you stand comfortably? 1 hour max    How long can you walk comfortably? 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  Negative for hemorrhage or mass.   Patient Stated Goals get back to the gym, decrease his falls risk    Currently in Pain? No/denies   Pain Onset Other (comment)          TREATMENT: Patient reports minimal compliance with HEP as he had tooth pulled yesterday; emphasized importance of HEP daily to  improve strength, flexibility and mobility;  Re-educated patient in Supine PWR 2x10 with cues for intensity and velocity; He required max VCs and tactile cues to improve ROM, pause between PWR twist and then bring arm over for hand clap with high velocity for better intensity challenge and accuracy; Also required max VCs for sequencing with Supine PWR step as patient has trouble with moving LEs out and back in to center.   Initiated  Prone PWR 2x10 with max verbal and tactile cues for correct positioning and technique; provided several cues for boosts to improve intensity of exercise; Boosts included open hands, breath control, loud clap with PWR twist for better coordination, etc; Patient also instructed in Prone PWR exercise with second bout being faster for better velocity of movement;  Finished with instruction in Standing PWR exercise x10 with max VCs and demonstration to improve accuracy and velocity of movement; patient able to demonstrate better PWR step in standing following cues for big stomp to increase foot clearance;                        PT Education - 11/10/16 1844    Education provided Yes   Education Details PWR exercise, HEP   Person(s) Educated Patient;Spouse   Methods Explanation;Demonstration;Verbal cues;Tactile cues   Comprehension Verbalized understanding;Returned demonstration;Verbal cues required  PT Long Term Goals - 10/18/16 1012      PT LONG TERM GOAL #1   Title Pt will become independent with HEP at home to increase functional independence with all ADLS.    Time 6   Period Weeks   Status New     PT LONG TERM GOAL #2   Title Pt will complete 5 x sit to stand in less than 15 seconds to decrease risk of falls.     Time 6   Period Weeks   Status New     PT LONG TERM GOAL #3   Title Pt will increase self reported LEFS score to 40/80 to decrease perception of disability and increase functional indepedence.     Time 6    Period Weeks   Status New     PT LONG TERM GOAL #4   Title Pt will increase gait speed to 1.43m/s to become a community ambulator and have greater functional mobility.     Time 6   Period Weeks   Status New     PT LONG TERM GOAL #5   Title Pt will self report worse pain of 2/10  in RLE order to improve tolerance and promote greater functional mobility.     Time 6   Period Weeks   Status New     Additional Long Term Goals   Additional Long Term Goals Yes     PT LONG TERM GOAL #6   Title Pt will improve TUG score to <13 seconds to decrease falls risk and promote more functional mobility.     Time 6   Period Weeks   Status New     PT LONG TERM GOAL #7   Title Pt will be able to get up and down from floor mod indep in order to garden at home safely.     Time 6   Period Weeks               Plan - 11/10/16 1844    Clinical Impression Statement PT introduced prone PWR exercise. Patient reinstructed in supine and standing PWR exercise. He required mod-max VCs and tactile cues to improve ROM, improve intensity and velocity of movement. Patient able to demonstrate better reciprocal gait pattern and arm swing with less stiffness following exercise. He continues to be concerned about progress but admits that he hasn't been as diligent with exercise. Patient would benefit from additonal skilled PT intervention to improve balance/gait safety and reduce fall risk;    Rehab Potential Good   Clinical Impairments Affecting Rehab Potential Positive: motivation, social support, active previously   Negative: chronicity, progression, recent falls, decrease in activity level    clinical impression: evolving    PT Frequency 2x / week   PT Duration 6 weeks   PT Treatment/Interventions Gait training;Therapeutic activities;Therapeutic exercise;Balance training;Neuromuscular re-education;Patient/family education;Stair training;Functional mobility training;Energy conservation;Manual techniques  spinal  mobilization/manipulation   PT Next Visit Plan ladder drills, obstacles, resisted walking, backwards steps    PT Home Exercise Plan sit to stands, 4 way hip, static balance on airex, bridges, clamshells     Consulted and Agree with Plan of Care Patient;Family member/caregiver   Family Member Consulted wife      Patient will benefit from skilled therapeutic intervention in order to improve the following deficits and impairments:  Abnormal gait, Decreased balance, Decreased strength, Hypomobility, Pain, Decreased endurance, Decreased activity tolerance, Decreased mobility  Visit Diagnosis: Difficulty in walking, not elsewhere classified  Unsteadiness on feet  Muscle weakness (generalized)     Problem List Patient Active Problem List   Diagnosis Date Noted  . HBP (high blood pressure) 11/16/2015    Trotter,Margaret PT, DPT 11/10/2016, 6:46 PM  [place] MAIN [facility] SERVICES [address] [place], [place], [address] Phone: [phone]   Fax:  [phone]  Name: Brendan Nguyen MRN: [id number] Date of Birth: Sep 22, 1941

## 2016-11-14 ENCOUNTER — Encounter: Payer: Self-pay | Admitting: Physical Therapy

## 2016-11-14 ENCOUNTER — Ambulatory Visit: Payer: PPO | Attending: Neurology | Admitting: Physical Therapy

## 2016-11-14 DIAGNOSIS — R262 Difficulty in walking, not elsewhere classified: Secondary | ICD-10-CM | POA: Diagnosis not present

## 2016-11-14 DIAGNOSIS — R2681 Unsteadiness on feet: Secondary | ICD-10-CM | POA: Diagnosis not present

## 2016-11-14 DIAGNOSIS — M6281 Muscle weakness (generalized): Secondary | ICD-10-CM

## 2016-11-14 NOTE — Therapy (Signed)
[place] MAIN [facility] SERVICES [address] [place], [place], [address] Phone: [phone]   Fax:  [phone]  Physical Therapy Treatment  Patient Details  Name: Brendan Nguyen MRN: [id number] Date of Birth: 1941-04-03 Referring Provider: Dr. Melrose Nakayama  Encounter Date: 11/14/2016      PT End of Session - 11/14/16 1800    Visit Number 8   Number of Visits 13   Date for PT Re-Evaluation 11/28/16   Authorization Type gcodes    Authorization Time Period 8/10   PT Start Time 1800   PT Stop Time 1845   PT Time Calculation (min) 45 min   Activity Tolerance Patient tolerated treatment well   Behavior During Therapy [facility] for tasks assessed/performed      Past Medical History:  Diagnosis Date  . Hypertension   . TIA (transient ischemic attack) 2013    Past Surgical History:  Procedure Laterality Date  . L5 lamenectomy      There were no vitals filed for this visit.    TREATMENT: Supine: BUE wand flexion x10 with cues to slow down movement and improve overhead reach for better thoracic/shoulder flexibility;  Re-educated patient in Supine PWR x10 with cues for intensity and velocity; He required mod VCs and tactile cues to improve ROM, pause between PWR twist and then bring arm over for hand clap with high velocity for better intensity challenge and accuracy; Also required max VCs for sequencing with Supine PWR step as patient has trouble with moving LEs out and back in to center.  Supine PWR flow with max VCs for sequencing x5 reps;  Re-educated  Prone PWR x10 with min verbal and tactile cues for correct positioning and technique; Pt able to demonstrate better weight shift, better flexibility and velocity of movement;  Instructed patient in qped PWR exercise x10 reps each; Patient required max VCs, demonstration and tactile cues for correct positioning, with cues for better posture, improved weight shift, increased reach with twist and to  improve hip flexion with step; Patient fatigues quickly with prolonged UE weight bearing;  10 meter walk test after exercise 1.05 m/s                                       PT Long Term Goals - 10/18/16 1012      PT LONG TERM GOAL #1   Title Pt will become independent with HEP at home to increase functional independence with all ADLS.    Time 6   Period Weeks   Status New     PT LONG TERM GOAL #2   Title Pt will complete 5 x sit to stand in less than 15 seconds to decrease risk of falls.     Time 6   Period Weeks   Status New     PT LONG TERM GOAL #3   Title Pt will increase self reported LEFS score to 40/80 to decrease perception of disability and increase functional indepedence.     Time 6   Period Weeks   Status New     PT LONG TERM GOAL #4   Title Pt will increase gait speed to 1.39m/s to become a community ambulator and have greater functional mobility.     Time 6   Period Weeks   Status New     PT LONG TERM GOAL #5   Title Pt will self report  worse pain of 2/10  in RLE order to improve tolerance and promote greater functional mobility.     Time 6   Period Weeks   Status New     Additional Long Term Goals   Additional Long Term Goals Yes     PT LONG TERM GOAL #6   Title Pt will improve TUG score to <13 seconds to decrease falls risk and promote more functional mobility.     Time 6   Period Weeks   Status New     PT LONG TERM GOAL #7   Title Pt will be able to get up and down from floor mod indep in order to garden at home safely.     Time 6   Period Weeks               Plan - 11/14/16 1850    Clinical Impression Statement PT initiated qped PWR exercise. Patient able to demonstrate better ROM, flexibility, intensity and velocity with supine and prone PWR exercise. He required max VCs, tactile cues and demonstration for correct positioning with qped exercise. He had a harder time with increased UE fatigue in qped.  Patient would benefit from additional skilled PT intervention to improve balance/gait safety and overall mobility;    Rehab Potential Good   Clinical Impairments Affecting Rehab Potential Positive: motivation, social support, active previously   Negative: chronicity, progression, recent falls, decrease in activity level    clinical impression: evolving    PT Frequency 2x / week   PT Duration 6 weeks   PT Treatment/Interventions Gait training;Therapeutic activities;Therapeutic exercise;Balance training;Neuromuscular re-education;Patient/family education;Stair training;Functional mobility training;Energy conservation;Manual techniques  spinal mobilization/manipulation   PT Next Visit Plan ladder drills, obstacles, resisted walking, backwards steps    PT Home Exercise Plan sit to stands, 4 way hip, static balance on airex, bridges, clamshells     Consulted and Agree with Plan of Care Patient;Family member/caregiver   Family Member Consulted wife      Patient will benefit from skilled therapeutic intervention in order to improve the following deficits and impairments:  Abnormal gait, Decreased balance, Decreased strength, Hypomobility, Pain, Decreased endurance, Decreased activity tolerance, Decreased mobility  Visit Diagnosis: Difficulty in walking, not elsewhere classified  Unsteadiness on feet  Muscle weakness (generalized)     Problem List Patient Active Problem List   Diagnosis Date Noted  . HBP (high blood pressure) 11/16/2015    Christepher Melchior PT, DPT 11/14/2016, 6:52 PM  [place] MAIN [facility] SERVICES [address] [place], [place], [address] Phone: [phone]   Fax:  [phone]  Name: Brendan Nguyen MRN: [id number] Date of Birth: 1941-11-15

## 2016-11-15 DIAGNOSIS — I10 Essential (primary) hypertension: Secondary | ICD-10-CM | POA: Diagnosis not present

## 2016-11-15 DIAGNOSIS — R600 Localized edema: Secondary | ICD-10-CM | POA: Diagnosis not present

## 2016-11-15 DIAGNOSIS — N183 Chronic kidney disease, stage 3 (moderate): Secondary | ICD-10-CM | POA: Diagnosis not present

## 2016-11-15 DIAGNOSIS — N2581 Secondary hyperparathyroidism of renal origin: Secondary | ICD-10-CM | POA: Diagnosis not present

## 2016-11-16 DIAGNOSIS — R278 Other lack of coordination: Secondary | ICD-10-CM | POA: Diagnosis not present

## 2016-11-16 DIAGNOSIS — R200 Anesthesia of skin: Secondary | ICD-10-CM | POA: Diagnosis not present

## 2016-11-17 ENCOUNTER — Ambulatory Visit: Payer: PPO | Admitting: Physical Therapy

## 2016-11-17 ENCOUNTER — Encounter: Payer: Self-pay | Admitting: Physical Therapy

## 2016-11-17 DIAGNOSIS — M6281 Muscle weakness (generalized): Secondary | ICD-10-CM

## 2016-11-17 DIAGNOSIS — R262 Difficulty in walking, not elsewhere classified: Secondary | ICD-10-CM | POA: Diagnosis not present

## 2016-11-17 DIAGNOSIS — R2681 Unsteadiness on feet: Secondary | ICD-10-CM

## 2016-11-17 NOTE — Therapy (Signed)
[place] MAIN [facility] SERVICES [address] [place], [place], [address] Phone: [phone]   Fax:  [phone]  Physical Therapy Treatment  Patient Details  Name: KENE WOOLDRIDGE MRN: [id number] Date of Birth: 05/08/1941 Referring Provider: Dr. Melrose Nakayama  Encounter Date: 11/17/2016      PT End of Session - 11/17/16 1624    Visit Number 9   Number of Visits 13   Date for PT Re-Evaluation 11/28/16   Authorization Type 9   Authorization Time Period 9/10   PT Start Time 0400   PT Stop Time 0440   PT Time Calculation (min) 40 min   Equipment Utilized During Treatment Gait belt   Activity Tolerance Patient tolerated treatment well;Patient limited by fatigue   Behavior During Therapy [facility] for tasks assessed/performed      Past Medical History:  Diagnosis Date  . Hypertension   . TIA (transient ischemic attack) 2013    Past Surgical History:  Procedure Laterality Date  . L5 lamenectomy      There were no vitals filed for this visit.      Subjective Assessment - 11/17/16 1623    Subjective Patient says that the MD does not think he has parkinsons disease and that is good news.   Currently in Pain? No/denies   Pain Score 0-No pain       Therapeutic exercise: Standing with 3 and 4 lbs left and right LE for hip abd, hip extension, hip flex x 15 x 3 sets Matrix hip abd, hip extension 12. 5 lbs RLe, 7 lbs LLE standing sidelying hip flex/ext x 15 x 2, hip abd x 15 x 2, hip abd with knee flex x 15 x 2 TM walking 1.0 x 2 mins x 3 with poor standing posture and continuous verbal cuing for long steps and better posture.  Patient was recommended to try aquatic exercise to add to his HEP.Patent has fatigue with all exercises and needs rest periods throughout session. Patient has difficulty with all mobility in supine and sidelying and moving on the mat from each position and returning back to sitting position.                             PT Education - 11/17/16 1623    Education provided Yes   Education Details HEP with 3 lbs             PT Long Term Goals - 10/18/16 1012      PT LONG TERM GOAL #1   Title Pt will become independent with HEP at home to increase functional independence with all ADLS.    Time 6   Period Weeks   Status New     PT LONG TERM GOAL #2   Title Pt will complete 5 x sit to stand in less than 15 seconds to decrease risk of falls.     Time 6   Period Weeks   Status New     PT LONG TERM GOAL #3   Title Pt will increase self reported LEFS score to 40/80 to decrease perception of disability and increase functional indepedence.     Time 6   Period Weeks   Status New     PT LONG TERM GOAL #4   Title Pt will increase gait speed to 1.16m/s to become a community ambulator and have greater functional mobility.     Time 6   Period Weeks  Status New     PT LONG TERM GOAL #5   Title Pt will self report worse pain of 2/10  in RLE order to improve tolerance and promote greater functional mobility.     Time 6   Period Weeks   Status New     Additional Long Term Goals   Additional Long Term Goals Yes     PT LONG TERM GOAL #6   Title Pt will improve TUG score to <13 seconds to decrease falls risk and promote more functional mobility.     Time 6   Period Weeks   Status New     PT LONG TERM GOAL #7   Title Pt will be able to get up and down from floor mod indep in order to garden at home safely.     Time 6   Period Weeks               Plan - 11/17/16 1625    Clinical Impression Statement Patient has decreased streng of BLE with RLE weaker than LLE. He is able to perform resistive exercises to improve strength deficits and gait training to improve step length and height   Rehab Potential Good   Clinical Impairments Affecting Rehab Potential Positive: motivation, social support, active previously   Negative: chronicity,  progression, recent falls, decrease in activity level    clinical impression: evolving    PT Frequency 2x / week   PT Duration 6 weeks   PT Treatment/Interventions Gait training;Therapeutic activities;Therapeutic exercise;Balance training;Neuromuscular re-education;Patient/family education;Stair training;Functional mobility training;Energy conservation;Manual techniques  spinal mobilization/manipulation   PT Next Visit Plan ladder drills, obstacles, resisted walking, backwards steps    PT Home Exercise Plan sit to stands, 4 way hip, static balance on airex, bridges, clamshells     Consulted and Agree with Plan of Care Patient;Family member/caregiver   Family Member Consulted wife      Patient will benefit from skilled therapeutic intervention in order to improve the following deficits and impairments:  Abnormal gait, Decreased balance, Decreased strength, Hypomobility, Pain, Decreased endurance, Decreased activity tolerance, Decreased mobility  Visit Diagnosis: Difficulty in walking, not elsewhere classified  Unsteadiness on feet  Muscle weakness (generalized)     Problem List Patient Active Problem List   Diagnosis Date Noted  . HBP (high blood pressure) 11/16/2015   Alanson Puls, PT, DPT [place], [place] 11/17/2016, 4:57 PM  [place] MAIN [facility] SERVICES [address] [place], [place], [address] Phone: [phone]   Fax:  [phone]  Name: JERID ZACHRY MRN: [id number] Date of Birth: [birth year]

## 2016-11-21 ENCOUNTER — Ambulatory Visit: Payer: PPO | Admitting: Physical Therapy

## 2016-11-21 DIAGNOSIS — M6281 Muscle weakness (generalized): Secondary | ICD-10-CM

## 2016-11-21 DIAGNOSIS — R262 Difficulty in walking, not elsewhere classified: Secondary | ICD-10-CM | POA: Diagnosis not present

## 2016-11-21 DIAGNOSIS — R2681 Unsteadiness on feet: Secondary | ICD-10-CM

## 2016-11-21 NOTE — Therapy (Signed)
[place] MAIN [facility] SERVICES [address] [place], [place], [address] Phone: [phone]   Fax:  [phone]  Physical Therapy Treatment/Progress Note  Patient Details  Name: Brendan Nguyen MRN: [id number] Date of Birth: Apr 26, 1941 Referring Provider: Dr. Melrose Nakayama  Encounter Date: 11/21/2016      PT End of Session - 11/21/16 1607    Visit Number 10   Number of Visits 21   Date for PT Re-Evaluation 12/19/16   Authorization Type 10   Authorization Time Period 10   PT Start Time 1600   PT Stop Time 1645   PT Time Calculation (min) 45 min   Equipment Utilized During Treatment Gait belt   Activity Tolerance Patient tolerated treatment well;Patient limited by fatigue   Behavior During Therapy [facility] for tasks assessed/performed      Past Medical History:  Diagnosis Date  . Hypertension   . TIA (transient ischemic attack) 2013    Past Surgical History:  Procedure Laterality Date  . L5 lamenectomy      There were no vitals filed for this visit.      Subjective Assessment - 11/21/16 1627    Subjective Patient reports doing some of the exercise at home but hasn't been very consistent. He reports not walking as much; Wasn't able to go to the pool because it was closed due to weather;    Patient is accompained by: Family member  wife   Pertinent History Personal factors affecting rehab: PLOF, active, social support, falls risk, age, progressive symptoms    Limitations Standing;Walking;Lifting   How long can you sit comfortably? n/a    How long can you stand comfortably? 1 hour max    How long can you walk comfortably? 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  Negative for hemorrhage or mass.   Patient Stated Goals get back to the gym, decrease his falls risk    Currently in Pain? No/denies  reports stiffness;    Pain Onset Other (comment)            [facility] PT  Assessment - 11/21/16 0001      Observation/Other Assessments   Lower Extremity Functional Scale  18/80 (the lower the score the greater the disability; no change from eval on 10/18/16 which was 19/80     Standardized Balance Assessment   Five times sit to stand comments  10 sec without HHA (improved from 10/18/16 which was 22 sec with BUE pushing, low fall risk)   10 Meter Walk 0.8333 m/s without AD (home ambulator, limited community ambulator, improved from 10/18/16 which was 0.55 m/s)     Timed Up and Go Test   Normal TUG (seconds) 11.5   TUG Comments without AD; improved from 10/18/16 which was 22 sec        TREATMENT: Warm up on elliptical level 0 x3 min with mod A for initiating momentum to keep moving around reciprocally on elliptical; Patient required mod Vcs for improved weight shift to increase momentum;  Gait on treadmill x3 min, 1.0 mph with 2 HHA with mod Vcs to increase step length, increase heel strike and improve erect posture; patient continues to ambulate with flexed position with flexed knees with decreased foot clearance;  PT instructed patient in outcome measures, see above; While he demonstrates significant improvement in gait speed and transfer ability, patient is not consistent with mobility. He reports no significant change on LEFs.  Leg  press, BLE plate 105# G28, 366# x10 with mod VCs to improve hip/knee flexion with better eccentric return for better stretch and strengthening;  Patient continues to require significant cues to increase step length and arm swing with gait for better foot clearance; He is able to self correct for short period of time but will revert back to short shuffled steps; Ascend/descend 4 steps forward reciprocally with 2 rail assist with mod VCs to put entire foot on step and not just toes;                     PT Education - 11/21/16 1743    Education provided Yes   Education Details walking program, HEP, strengthening    Person(s) Educated Patient;Spouse   Methods Explanation;Verbal cues   Comprehension Verbalized understanding;Returned demonstration;Verbal cues required             PT Long Term Goals - 11/21/16 1745      PT LONG TERM GOAL #1   Title Pt will become independent with HEP at home to increase functional independence with all ADLS.    Time 4   Period Weeks   Status On-going     PT LONG TERM GOAL #2   Title Pt will complete 5 x sit to stand in less than 15 seconds to decrease risk of falls.     Time 4   Period Weeks   Status Achieved     PT LONG TERM GOAL #3   Title Pt will increase self reported LEFS score to 40/80 to decrease perception of disability and increase functional indepedence.     Time 4   Period Weeks   Status Not Met     PT LONG TERM GOAL #4   Title Pt will increase gait speed to 1.64ms to become a community ambulator and have greater functional mobility.     Time 4   Period Weeks   Status Partially Met     PT LONG TERM GOAL #5   Title Pt will self report worse pain of 2/10  in RLE order to improve tolerance and promote greater functional mobility.     Time 4   Period Weeks   Status Partially Met     PT LONG TERM GOAL #6   Title Pt will improve TUG score to <13 seconds to decrease falls risk and promote more functional mobility.     Time 4   Period Weeks   Status Achieved     PT LONG TERM GOAL #7   Title Pt will be able to get up and down from floor mod indep in order to garden at home safely.     Time 4   Period Weeks   Status Not Met               Plan - 11/21/16 1744    Clinical Impression Statement Patient reports having a hard time this afternoon. He reports sitting at a luncheon for about 2 hours without moving. He states that when he went to get up he couldn't walk well and was very stiff. PT instructed patient in outcome measures to assess progress towards goals. While he has made signficant improvement in gait speed and transfer  ability, he is not consistent in mobility. Patient would benefit from additional skilled PT intervention to improve balance/gait safety and LE strength;    Rehab Potential Good   Clinical Impairments Affecting Rehab Potential Positive: motivation, social support, active previously   Negative:  chronicity, progression, recent falls, decrease in activity level    clinical impression: evolving    PT Frequency 2x / week   PT Duration 6 weeks   PT Treatment/Interventions Gait training;Therapeutic activities;Therapeutic exercise;Balance training;Neuromuscular re-education;Patient/family education;Stair training;Functional mobility training;Energy conservation;Manual techniques  spinal mobilization/manipulation   PT Next Visit Plan ladder drills, obstacles, resisted walking, backwards steps    PT Home Exercise Plan sit to stands, 4 way hip, static balance on airex, bridges, clamshells     Consulted and Agree with Plan of Care Patient;Family member/caregiver   Family Member Consulted wife      Patient will benefit from skilled therapeutic intervention in order to improve the following deficits and impairments:  Abnormal gait, Decreased balance, Decreased strength, Hypomobility, Pain, Decreased endurance, Decreased activity tolerance, Decreased mobility  Visit Diagnosis: Difficulty in walking, not elsewhere classified - Plan: PT plan of care cert/re-cert  Unsteadiness on feet - Plan: PT plan of care cert/re-cert  Muscle weakness (generalized) - Plan: PT plan of care cert/re-cert       G-Codes - 2016/12/09 1745    Functional Assessment Tool Used clinical judgement, 5 times sit<>stand, 10 meter walk   Functional Limitation Mobility: Walking and moving around   Mobility: Walking and Moving Around Current Status (A6301) At least 20 percent but less than 40 percent impaired, limited or restricted   Mobility: Walking and Moving Around Goal Status (S0109) At least 1 percent but less than 20 percent  impaired, limited or restricted      Problem List Patient Active Problem List   Diagnosis Date Noted  . HBP (high blood pressure) 11/16/2015    , PT, DPT 2016-12-09, 5:47 PM  [place] MAIN [facility] SERVICES [address] [place], [place], [address] Phone: [phone]   Fax:  [phone]  Name: EYDEN DOBIE MRN: [id number] Date of Birth: 1941/07/17

## 2016-11-23 ENCOUNTER — Encounter: Payer: Self-pay | Admitting: Physical Therapy

## 2016-11-23 ENCOUNTER — Ambulatory Visit: Payer: PPO | Admitting: Physical Therapy

## 2016-11-23 DIAGNOSIS — M6281 Muscle weakness (generalized): Secondary | ICD-10-CM

## 2016-11-23 DIAGNOSIS — R2681 Unsteadiness on feet: Secondary | ICD-10-CM

## 2016-11-23 DIAGNOSIS — R262 Difficulty in walking, not elsewhere classified: Secondary | ICD-10-CM

## 2016-11-23 NOTE — Therapy (Signed)
[place] MAIN [facility] SERVICES [address] [place], [place], [address] Phone: [phone]   Fax:  [phone]  Physical Therapy Treatment  Patient Details  Name: Brendan Nguyen MRN: [id number] Date of Birth: 1941-10-31 Referring Provider: Dr. Melrose Nakayama  Encounter Date: 11/23/2016      PT End of Session - 11/23/16 1636    Visit Number 11   Number of Visits 21   Date for PT Re-Evaluation 12/19/16   Authorization Type 1   Authorization Time Period 10   PT Start Time 0445   PT Stop Time 0530   PT Time Calculation (min) 45 min   Equipment Utilized During Treatment Gait belt   Activity Tolerance Patient tolerated treatment well;Patient limited by fatigue   Behavior During Therapy [facility] for tasks assessed/performed      Past Medical History:  Diagnosis Date  . Hypertension   . TIA (transient ischemic attack) 2013    Past Surgical History:  Procedure Laterality Date  . L5 lamenectomy      There were no vitals filed for this visit.  TM walking x 5 minutes Stepping fwd , stepping bwds with and without UE support  X 10 x 2 sets Weight shifting fwd//bwd x 10 x 2 sets Gait training with big steps and weight shifting Patient needs cuing for big steps and weight shifting.                             PT Education - 11/23/16 1636    Education provided Yes   Education Details LE strengthening   Person(s) Educated Patient   Methods Explanation   Comprehension Verbalized understanding             PT Long Term Goals - 11/21/16 1745      PT LONG TERM GOAL #1   Title Pt will become independent with HEP at home to increase functional independence with all ADLS.    Time 4   Period Weeks   Status On-going     PT LONG TERM GOAL #2   Title Pt will complete 5 x sit to stand in less than 15 seconds to decrease risk of falls.     Time 4   Period Weeks   Status Achieved     PT LONG TERM GOAL #3   Title Pt will  increase self reported LEFS score to 40/80 to decrease perception of disability and increase functional indepedence.     Time 4   Period Weeks   Status Not Met     PT LONG TERM GOAL #4   Title Pt will increase gait speed to 1.73ms to become a community ambulator and have greater functional mobility.     Time 4   Period Weeks   Status Partially Met     PT LONG TERM GOAL #5   Title Pt will self report worse pain of 2/10  in RLE order to improve tolerance and promote greater functional mobility.     Time 4   Period Weeks   Status Partially Met     PT LONG TERM GOAL #6   Title Pt will improve TUG score to <13 seconds to decrease falls risk and promote more functional mobility.     Time 4   Period Weeks   Status Achieved     PT LONG TERM GOAL #7   Title Pt will be able to get up and down from  floor mod indep in order to garden at home safely.     Time 4   Period Weeks   Status Not Met               Plan - 11/23/16 1636    Clinical Impression Statement Patient continues to have deficits in balance, gait and safety and was able to perform strengthening and dynamic standing balance activities to improve his falls risk and gait speed.    Rehab Potential Good   PT Frequency 2x / week   PT Duration 6 weeks   PT Treatment/Interventions Gait training;Therapeutic activities;Therapeutic exercise;Balance training;Neuromuscular re-education;Patient/family education;Stair training;Functional mobility training;Energy conservation;Manual techniques   PT Next Visit Plan ladder drills, obstacles, resisted walking, backwards steps    PT Home Exercise Plan sit to stands, 4 way hip, static balance on airex, bridges, clamshells     Consulted and Agree with Plan of Care Patient;Family member/caregiver      Patient will benefit from skilled therapeutic intervention in order to improve the following deficits and impairments:  Abnormal gait, Decreased balance, Decreased strength, Hypomobility,  Pain, Decreased endurance, Decreased activity tolerance, Decreased mobility  Visit Diagnosis: Difficulty in walking, not elsewhere classified  Unsteadiness on feet  Muscle weakness (generalized)     Problem List Patient Active Problem List   Diagnosis Date Noted  . HBP (high blood pressure) 11/16/2015   Alanson Puls, PT, DPT [place], Minette Headland S 11/23/2016, 4:39 PM  [place] [facility] MAIN [facility] SERVICES [address] [place], [place], [address] Phone: [phone]   Fax:  [phone]  Name: Brendan Nguyen MRN: [id number] Date of Birth: Apr 22, 1941

## 2016-11-28 ENCOUNTER — Encounter: Payer: Self-pay | Admitting: Physical Therapy

## 2016-11-28 ENCOUNTER — Ambulatory Visit: Payer: PPO | Admitting: Physical Therapy

## 2016-11-28 DIAGNOSIS — M6281 Muscle weakness (generalized): Secondary | ICD-10-CM

## 2016-11-28 DIAGNOSIS — R262 Difficulty in walking, not elsewhere classified: Secondary | ICD-10-CM | POA: Diagnosis not present

## 2016-11-28 DIAGNOSIS — R2681 Unsteadiness on feet: Secondary | ICD-10-CM

## 2016-11-29 NOTE — Therapy (Signed)
[place] MAIN [facility] SERVICES [address] [place], [place], [address] Phone: [phone]   Fax:  [phone]  Physical Therapy Treatment  Patient Details  Name: Brendan Nguyen MRN: [id number] Date of Birth: [birth year] Referring Provider: Dr. Melrose Nakayama  Encounter Date: 11/28/2016      PT End of Session - 11/28/16 1723    Visit Number 12   Number of Visits 21   Date for PT Re-Evaluation 12/19/16   Authorization Type 2   Authorization Time Period 10   PT Start Time 1615   PT Stop Time 1710   PT Time Calculation (min) 55 min   Activity Tolerance Patient tolerated treatment well;Patient limited by fatigue   Behavior During Therapy [facility] for tasks assessed/performed      Past Medical History:  Diagnosis Date  . Hypertension   . TIA (transient ischemic attack) 2013    Past Surgical History:  Procedure Laterality Date  . L5 lamenectomy      There were no vitals filed for this visit.      Subjective Assessment - 11/28/16 1622    Subjective (P)  patient reports going to the pool. He reports having trouble getting in/out but really enjoyed the pool. After getting out of the pool he had significant trouble drying off and getting dressed;    Patient is accompained by: (P)  Family member  wife   Pertinent History (P)  Personal factors affecting rehab: PLOF, active, social support, falls risk, age, progressive symptoms    Limitations (P)  Standing;Walking;Lifting   How long can you sit comfortably? (P)  n/a    How long can you stand comfortably? (P)  1 hour max    How long can you walk comfortably? (P)  10 minutes walking    Diagnostic tests (P)  MRI: findings are most consistent with chronic microvascular ischemia.  Negative for hemorrhage or mass.   Patient Stated Goals (P)  get back to the gym, decrease his falls risk    Currently in Pain? (P)  No/denies         TREATMENT: Patient presents to therapy with increased fatigue. He is  able to demonstrate slight improvement in reciprocal gait but still has heavy toe touch.  10 meter walk at start of session: 0.95 m/s without AD  Standing: Heel/toe weight shift x10 reps each foot in front with mod Vcs to increase weight shift and to increase step length prior to weight shift for improved base of support; Heel/toe weight shift x10 with UE arm swing each foot in front with mod VCs for sequencing and demonstration to improve UE movement with LE weight shift; Patient able to demonstrate better stance control and better UE swing as compared to previous sessions;  Gait on treadmill: .5-1.2 mph with 2 HHA x3 min, with max VCs to increase heel/toe gait and to increase step length; Gait on treadmill .8-1.2 mph with 2 HHA with kicking pball to facilitate better step through and heel strike x2 min; progressing to regular gait pattern heel/toe walking at initial contact x5 min with max VCs and demonstration for better reciprocal gait, increase step length and increase knee extension for better ankle DF at heel strike; Patient able to demonstrate better gait pattern with heel strike when walking at 0.5 mph as compared to 1.0 mph;  Gait around gym x200 feet with cues for arm swing and to increase ankle DF at heel strike;   Gait in parallel bars: x3 laps with  visual cues to increase ankle DF at heel strike for better toe clearance and reciprocal gait with and without rails; Forward/backward heel/toe weight shift 2x10 each foot in front with visual cues to increase ankle movement for better weight shift with arm swing;  Facing mirror with cards on mirror: Side stepping with overhead reach touching cards to facilitate foot clearance (P[facility] rock) x2-3 min;  Gait around gym with walking poles in each UE with therapist follow to facilitate better arm swing and trunk rotation with steps x200 feet with cues for better reciprocal gait pattern;  10 meter walk after treatment session: 1.05 m/s without  AD                        PT Education - 11/28/16 1722    Education provided Yes   Education Details gait training, weight shifting;    Person(s) Educated Patient   Methods Explanation;Verbal cues;Demonstration   Comprehension Verbalized understanding;Returned demonstration;Verbal cues required             PT Long Term Goals - 11/21/16 1745      PT LONG TERM GOAL #1   Title Pt will become independent with HEP at home to increase functional independence with all ADLS.    Time 4   Period Weeks   Status On-going     PT LONG TERM GOAL #2   Title Pt will complete 5 x sit to stand in less than 15 seconds to decrease risk of falls.     Time 4   Period Weeks   Status Achieved     PT LONG TERM GOAL #3   Title Pt will increase self reported LEFS score to 40/80 to decrease perception of disability and increase functional indepedence.     Time 4   Period Weeks   Status Not Met     PT LONG TERM GOAL #4   Title Pt will increase gait speed to 1.90ms to become a community ambulator and have greater functional mobility.     Time 4   Period Weeks   Status Partially Met     PT LONG TERM GOAL #5   Title Pt will self report worse pain of 2/10  in RLE order to improve tolerance and promote greater functional mobility.     Time 4   Period Weeks   Status Partially Met     PT LONG TERM GOAL #6   Title Pt will improve TUG score to <13 seconds to decrease falls risk and promote more functional mobility.     Time 4   Period Weeks   Status Achieved     PT LONG TERM GOAL #7   Title Pt will be able to get up and down from floor mod indep in order to garden at home safely.     Time 4   Period Weeks   Status Not Met               Plan - 11/28/16 1723    Clinical Impression Statement patient instructed in advanced weight shifting tasks to improve gait ability; He is able to demonstrate better heel/toe shift with arm swing with mod VCs and demonstration; patient  demonstrates improved 10 meter walk speed with improved reciprocal gait pattern at end of treatment session; However he is not consistent with continuing heel/toe walk. He would benefit from additional skilled PT intervention to improve strength, balance and gait safety;    Rehab Potential Good  PT Frequency 2x / week   PT Duration 6 weeks   PT Treatment/Interventions Gait training;Therapeutic activities;Therapeutic exercise;Balance training;Neuromuscular re-education;Patient/family education;Stair training;Functional mobility training;Energy conservation;Manual techniques   PT Next Visit Plan ladder drills, obstacles, resisted walking, backwards steps    PT Home Exercise Plan sit to stands, 4 way hip, static balance on airex, bridges, clamshells     Consulted and Agree with Plan of Care Patient;Family member/caregiver      Patient will benefit from skilled therapeutic intervention in order to improve the following deficits and impairments:  Abnormal gait, Decreased balance, Decreased strength, Hypomobility, Pain, Decreased endurance, Decreased activity tolerance, Decreased mobility  Visit Diagnosis: Difficulty in walking, not elsewhere classified  Unsteadiness on feet  Muscle weakness (generalized)     Problem List Patient Active Problem List   Diagnosis Date Noted  . HBP (high blood pressure) 11/16/2015    Kassiah Mccrory PT, DPT 11/29/2016, 8:53 AM  [place] MAIN [facility] SERVICES [address] [place], [place], [address] Phone: [phone]   Fax:  [phone]  Name: NAAMAN CURRO MRN: [id number] Date of Birth: 15-Jul-1941

## 2016-11-30 ENCOUNTER — Ambulatory Visit: Payer: PPO | Admitting: Physical Therapy

## 2016-11-30 ENCOUNTER — Encounter: Payer: Self-pay | Admitting: Physical Therapy

## 2016-11-30 DIAGNOSIS — M6281 Muscle weakness (generalized): Secondary | ICD-10-CM

## 2016-11-30 DIAGNOSIS — R2681 Unsteadiness on feet: Secondary | ICD-10-CM

## 2016-11-30 DIAGNOSIS — R262 Difficulty in walking, not elsewhere classified: Secondary | ICD-10-CM | POA: Diagnosis not present

## 2016-11-30 NOTE — Therapy (Signed)
[place] MAIN [facility] SERVICES [address] [place], [place], [address] Phone: [phone]   Fax:  [phone]  Physical Therapy Treatment  Patient Details  Name: Brendan Nguyen MRN: [id number] Date of Birth: 01/04/1941 Referring Provider: Dr. Melrose Nakayama  Encounter Date: 11/30/2016      PT End of Session - 11/30/16 1749    Visit Number 13   Number of Visits 21   Date for PT Re-Evaluation 12/19/16   Authorization Type 3   Authorization Time Period 10   PT Start Time 1615   PT Stop Time 1700   PT Time Calculation (min) 45 min   Activity Tolerance Patient tolerated treatment well;Patient limited by fatigue   Behavior During Therapy [facility] for tasks assessed/performed      Past Medical History:  Diagnosis Date  . Hypertension   . TIA (transient ischemic attack) 2013    Past Surgical History:  Procedure Laterality Date  . L5 lamenectomy      There were no vitals filed for this visit.      Subjective Assessment - 11/30/16 1747    Subjective Patient reports having a not so good day. "I am having a harder time today. I just can't seem to move around. I feel so stiff." patient's wife reports that he has not been doing the exercises this week.    Patient is accompained by: Family member  wife   Pertinent History Personal factors affecting rehab: PLOF, active, social support, falls risk, age, progressive symptoms    Limitations Standing;Walking;Lifting   How long can you sit comfortably? n/a    How long can you stand comfortably? 1 hour max    How long can you walk comfortably? 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  Negative for hemorrhage or mass.   Patient Stated Goals get back to the gym, decrease his falls risk    Currently in Pain? No/denies      TREATMENT: Warm up on octane cross trainer x3 min with min A to increase speed for continued motion;  Leg press: BLE  plate #7 (237#) 6E83 with cues to increase ROM for better strengthening; Leg press, BLE heel raises (133#) with mod VCs for positioning to improve ankle strength x10; patient had difficulty keeping knees straight due to tightness in hamstrings;  Long sitting hamstring stretch 20 sec x2 bilaterally; Supine: Passive knee to chest stretch 15 sec hold x2; Hamstring SLR stretch 15 sec hold x2 Hamstring neural stretch with ankle DF/PF x10 reps  Hooklying: Lumbar trunk rotation x10 reps with min A overpressure to increase hip/back stretch; Sidelying: UE reaching back into upper trunk rotation stretch x5 reps AROM;  Patient reports less tightness after stretches;   Standing: Heel/toe weight shift x10 reps each foot in front with mod Vcs to increase weight shift and to increase step length prior to weight shift for improved base of support; Heel/toe weight shift x10 with UE arm swing each foot in front with mod VCs for sequencing and demonstration to improve UE movement with LE weight shift; Patient demonstrates increased difficulty requiring frequent cues for sequencing and positioning;   Seated PWR exercise: Up x10 Rock 2x10 with cues to increase weight shift to side and stretch out contralateral LE Twist x10 with cues to increase clap and increase "open" position in between for increased intensity; Step 2x10 with cues to increase "stomp" for better intensity and strengthening;  Patient able to  transfer and walk out of gym easier with less toe drag as compared to start of session; Re-emphasized importance of HEP compliance;                        PT Education - 11/30/16 1749    Education provided Yes   Education Details gait training, stretches, strengthening   Person(s) Educated Patient   Methods Explanation;Verbal cues   Comprehension Verbalized understanding;Returned demonstration;Verbal cues required             PT Long Term Goals - 11/21/16 1745      PT  LONG TERM GOAL #1   Title Pt will become independent with HEP at home to increase functional independence with all ADLS.    Time 4   Period Weeks   Status On-going     PT LONG TERM GOAL #2   Title Pt will complete 5 x sit to stand in less than 15 seconds to decrease risk of falls.     Time 4   Period Weeks   Status Achieved     PT LONG TERM GOAL #3   Title Pt will increase self reported LEFS score to 40/80 to decrease perception of disability and increase functional indepedence.     Time 4   Period Weeks   Status Not Met     PT LONG TERM GOAL #4   Title Pt will increase gait speed to 1.62ms to become a community ambulator and have greater functional mobility.     Time 4   Period Weeks   Status Partially Met     PT LONG TERM GOAL #5   Title Pt will self report worse pain of 2/10  in RLE order to improve tolerance and promote greater functional mobility.     Time 4   Period Weeks   Status Partially Met     PT LONG TERM GOAL #6   Title Pt will improve TUG score to <13 seconds to decrease falls risk and promote more functional mobility.     Time 4   Period Weeks   Status Achieved     PT LONG TERM GOAL #7   Title Pt will be able to get up and down from floor mod indep in order to garden at home safely.     Time 4   Period Weeks   Status Not Met               Plan - 11/30/16 1751    Clinical Impression Statement Patient had increased difficulty during today session. He was instructed in LE stretches and strengthening. However following exercise continues to demonstrate foot drag and shuffled steps. PT emphasized how doing just stretches and strengthening would not address his motor planning deficit. PT instructed patient in forward/backward weight shifting and seated PWR exercise. He was able to demonstrate better foot clearance and mobility following treatment session as compared to start of session; He would benefit from additional skilled PT intervention to improve LE  strength, balance and mobility;    Rehab Potential Good   PT Frequency 2x / week   PT Duration 6 weeks   PT Treatment/Interventions Gait training;Therapeutic activities;Therapeutic exercise;Balance training;Neuromuscular re-education;Patient/family education;Stair training;Functional mobility training;Energy conservation;Manual techniques   PT Next Visit Plan ladder drills, obstacles, resisted walking, backwards steps    PT Home Exercise Plan sit to stands, 4 way hip, static balance on airex, bridges, clamshells     Consulted and Agree with Plan  of Care Patient;Family member/caregiver      Patient will benefit from skilled therapeutic intervention in order to improve the following deficits and impairments:  Abnormal gait, Decreased balance, Decreased strength, Hypomobility, Pain, Decreased endurance, Decreased activity tolerance, Decreased mobility  Visit Diagnosis: Difficulty in walking, not elsewhere classified  Unsteadiness on feet  Muscle weakness (generalized)     Problem List Patient Active Problem List   Diagnosis Date Noted  . HBP (high blood pressure) 11/16/2015    Tamyra Fojtik PT, DPT 11/30/2016, 5:53 PM  [place] MAIN [facility] SERVICES [address] [place], [place], [address] Phone: [phone]   Fax:  [phone]  Name: Brendan Nguyen MRN: [id number] Date of Birth: Oct 24, 1941

## 2016-12-07 ENCOUNTER — Ambulatory Visit: Payer: PPO | Admitting: Physical Therapy

## 2016-12-13 ENCOUNTER — Ambulatory Visit: Payer: PPO | Admitting: Physical Therapy

## 2016-12-15 ENCOUNTER — Ambulatory Visit: Payer: PPO | Admitting: Physical Therapy

## 2016-12-19 ENCOUNTER — Encounter: Payer: PPO | Admitting: Physical Therapy

## 2016-12-22 ENCOUNTER — Encounter: Payer: PPO | Admitting: Physical Therapy

## 2016-12-26 ENCOUNTER — Encounter: Payer: Self-pay | Admitting: Physical Therapy

## 2016-12-26 ENCOUNTER — Ambulatory Visit: Payer: PPO | Attending: Neurology | Admitting: Physical Therapy

## 2016-12-26 DIAGNOSIS — M6281 Muscle weakness (generalized): Secondary | ICD-10-CM

## 2016-12-26 DIAGNOSIS — R262 Difficulty in walking, not elsewhere classified: Secondary | ICD-10-CM | POA: Diagnosis not present

## 2016-12-26 DIAGNOSIS — R2681 Unsteadiness on feet: Secondary | ICD-10-CM | POA: Insufficient documentation

## 2016-12-26 NOTE — Therapy (Signed)
[place] MAIN [facility] SERVICES [address] [place], [place], [address] Phone: [phone]   Fax:  [phone]  Physical Therapy Treatment  Patient Details  Name: Brendan Nguyen MRN: [id number] Date of Birth: [birth year] Referring Provider: Dr. Melrose Nakayama  Encounter Date: 12/26/2016      PT End of Session - 12/26/16 1657    Visit Number 14   Number of Visits 29   Date for PT Re-Evaluation 01/23/17   Authorization Type 4   Authorization Time Period 10   PT Start Time 1645   PT Stop Time 1730   PT Time Calculation (min) 45 min   Equipment Utilized During Treatment Gait belt   Activity Tolerance Patient tolerated treatment well;Patient limited by fatigue   Behavior During Therapy [facility] for tasks assessed/performed      Past Medical History:  Diagnosis Date  . Hypertension   . TIA (transient ischemic attack) 2013    Past Surgical History:  Procedure Laterality Date  . L5 lamenectomy      There were no vitals filed for this visit.      Subjective Assessment - 12/26/16 1655    Subjective Patient missed a few weeks of therapy due to holidays and being out of town to [place] to visit family; He reports having to go up/down stairs multiple times; He reports by the end of the week he was able to move better and walk better; He is supposed to see a chiropractor neurologist on 01/04/17; He is unsure if he wants to keep this appointment as he is not sure about the time committments;    Patient is accompained by: Family member  wife   Pertinent History Personal factors affecting rehab: PLOF, active, social support, falls risk, age, progressive symptoms    Limitations Standing;Walking;Lifting   How long can you sit comfortably? n/a    How long can you stand comfortably? 1 hour max    How long can you walk comfortably? 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  Negative for hemorrhage  or mass.   Patient Stated Goals get back to the gym, decrease his falls risk    Currently in Pain? No/denies       TREATMENT: Warm up on Nustep BUE/BLE level 2 x4 min (Unbilled);       [facility] PT Assessment - 12/26/16 0001      Standardized Balance Assessment   Five times sit to stand comments  7.5 sec without HHA (low fall risk; improved from 10/17/16 which was 22 sec with UE support)   10 Meter Walk 1.0 m/s without AD, community ambulator; improved from 10/17/16 which was 0.55 m/s without AD     Timed Up and Go Test   Normal TUG (seconds) 8.5   TUG Comments without AD; improved from 10/17/16 which was 22 sec      Instructed patient in10 meter walk, 5 times sit<>stand, and timed up and go to assess progress towards goals; See above; Patient did require min VCs for correct activity technique;  Reach forward while sitting, touching the floor and then stand and jump reaching towards ceiling x10 reps; Up/down 6 steps with 1 rail assist x3 reps forward reciprocally; patient able to ascend skipping one step; Standing green tband around both legs: Hip abduction x15 bilaterally; Side stepping 10 feet x3 laps each direction with min Vcs to increase step length for better hip abductor strengthening;  Standing: Heel/toe raises  x15; Alternate march x15 with cues to increase hip flexion for better strengthening;  Patient required min-moderate verbal/tactile cues for correct exercise technique including to improve posture and increase ROM to increase strengthening;                          PT Education - 12/26/16 1657    Education provided Yes   Education Details gait safety, stretches, strengthening;    Person(s) Educated Patient   Methods Explanation;Verbal cues   Comprehension Verbalized understanding;Returned demonstration;Verbal cues required             PT Long Term Goals - 12/26/16 1704      PT LONG TERM GOAL #1   Title Pt will become independent with  HEP at home to increase functional independence with all ADLS.    Time 4   Period Weeks   Status On-going     PT LONG TERM GOAL #2   Title Pt will complete 5 x sit to stand in less than 15 seconds to decrease risk of falls.     Time 4   Period Weeks   Status Achieved     PT LONG TERM GOAL #3   Title Pt will increase self reported LEFS score to 40/80 to decrease perception of disability and increase functional indepedence.     Time 4   Period Weeks   Status Not Met     PT LONG TERM GOAL #4   Title Pt will increase gait speed to 1.92ms to become a community ambulator and have greater functional mobility.     Time 4   Period Weeks   Status Achieved     PT LONG TERM GOAL #5   Title Pt will self report worse pain of 2/10  in RLE order to improve tolerance and promote greater functional mobility.     Time 4   Period Weeks   Status Achieved     PT LONG TERM GOAL #6   Title Pt will improve TUG score to <13 seconds to decrease falls risk and promote more functional mobility.     Time 4   Period Weeks   Status Achieved     PT LONG TERM GOAL #7   Title Pt will be able to get up and down from floor mod indep in order to garden at home safely.     Time 4   Period Weeks   Status Partially Met               Plan - 12/26/16 1708    Clinical Impression Statement Patient has seen a significant change in functional mobility in last few weeks; He reports doing increased activity at home and at the gym. PT instructed patient in outcome measures to assess progress towards goals; Patient has made significant improvement in transfer and gait ability; He reports feeling a lot better as compared to 2 weeks ago. Patient instructed in advanced strengthening with cues to increase ROM and to increase intensity of exercise for better strengthening; PT recommends that patient continue with skilled PT intervention to advance HEP for better strengthening and to improve patient's ability to increase  activity during work tasks. Patient agreeable to additional skilled PT intervention for strenghtening and mobility exercise;    Rehab Potential Good   PT Frequency 2x / week   PT Duration 6 weeks   PT Treatment/Interventions Gait training;Therapeutic activities;Therapeutic exercise;Balance training;Neuromuscular re-education;Patient/family education;Stair training;Functional mobility training;Energy conservation;Manual techniques  PT Next Visit Plan ladder drills, obstacles, resisted walking, backwards steps    PT Home Exercise Plan sit to stands, 4 way hip, static balance on airex, bridges, clamshells     Consulted and Agree with Plan of Care Patient;Family member/caregiver      Patient will benefit from skilled therapeutic intervention in order to improve the following deficits and impairments:  Abnormal gait, Decreased balance, Decreased strength, Hypomobility, Pain, Decreased endurance, Decreased activity tolerance, Decreased mobility  Visit Diagnosis: Difficulty in walking, not elsewhere classified  Unsteadiness on feet  Muscle weakness (generalized)     Problem List Patient Active Problem List   Diagnosis Date Noted  . HBP (high blood pressure) 11/16/2015    Kyanna Mahrt PT, DPT 12/26/2016, 5:37 PM  [place] MAIN [facility] SERVICES [address] [place], [place], [address] Phone: [phone]   Fax:  [phone]  Name: Brendan Nguyen MRN: [id number] Date of Birth: 1941/02/01

## 2016-12-27 DIAGNOSIS — E559 Vitamin D deficiency, unspecified: Secondary | ICD-10-CM | POA: Diagnosis not present

## 2016-12-27 DIAGNOSIS — R7309 Other abnormal glucose: Secondary | ICD-10-CM | POA: Diagnosis not present

## 2016-12-27 DIAGNOSIS — E781 Pure hyperglyceridemia: Secondary | ICD-10-CM | POA: Diagnosis not present

## 2016-12-28 ENCOUNTER — Encounter: Payer: PPO | Admitting: Physical Therapy

## 2016-12-30 DIAGNOSIS — E559 Vitamin D deficiency, unspecified: Secondary | ICD-10-CM | POA: Insufficient documentation

## 2017-01-02 ENCOUNTER — Ambulatory Visit: Payer: PPO | Admitting: Physical Therapy

## 2017-01-02 ENCOUNTER — Encounter: Payer: Self-pay | Admitting: Physical Therapy

## 2017-01-02 DIAGNOSIS — M6281 Muscle weakness (generalized): Secondary | ICD-10-CM

## 2017-01-02 DIAGNOSIS — R262 Difficulty in walking, not elsewhere classified: Secondary | ICD-10-CM | POA: Diagnosis not present

## 2017-01-02 DIAGNOSIS — R2681 Unsteadiness on feet: Secondary | ICD-10-CM

## 2017-01-02 NOTE — Therapy (Signed)
[place] MAIN [facility] SERVICES [address] [place], [place], [address] Phone: [phone]   Fax:  [phone]  Physical Therapy Treatment  Patient Details  Name: Brendan Nguyen MRN: [id number] Date of Birth: 07/03/1941 Referring Provider: Dr. Melrose Nakayama  Encounter Date: 01/02/2017      PT End of Session - 01/02/17 1627    Visit Number 15   Number of Visits 29   Date for PT Re-Evaluation 01/23/17   Authorization Type 5   Authorization Time Period 10   PT Start Time 1615   PT Stop Time 1700   PT Time Calculation (min) 45 min   Equipment Utilized During Treatment Gait belt   Activity Tolerance Patient tolerated treatment well;Patient limited by fatigue   Behavior During Therapy [facility] for tasks assessed/performed      Past Medical History:  Diagnosis Date  . Hypertension   . TIA (transient ischemic attack) 2013    Past Surgical History:  Procedure Laterality Date  . L5 lamenectomy      There were no vitals filed for this visit.      Subjective Assessment - 01/02/17 1623    Subjective Patient reports doing okay today; He reports no new falls; Patient reports going to gym 1x last week; He reports wanting new exercises for home for increased challenge;    Patient is accompained by: Family member  wife   Pertinent History Personal factors affecting rehab: PLOF, active, social support, falls risk, age, progressive symptoms    Limitations Standing;Walking;Lifting   How long can you sit comfortably? n/a    How long can you stand comfortably? 1 hour max    How long can you walk comfortably? 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  Negative for hemorrhage or mass.   Patient Stated Goals get back to the gym, decrease his falls risk    Currently in Pain? No/denies        TREATMENT: Warm up on recumbent bike BLE only x4 min with HEP instruction;   Advanced HEP: Seated  green tband around both legs: Hip flexion march x10 bilaterally; Hip abduction x10 bilaterally; LAQ x10 bilaterally with mod VCs to straighten knee and avoid hip flexion; Ankle DF x10 bilaterally;   Sit<>Stand with yellow weighted ball overhead press x10;  Patient required min-moderate verbal/tactile cues for correct exercise technique including cues for band placement and LE positioning for better strengthening;  Standing with green tband around BLE: Hip abduction x10 bilaterally; Hip extension x10 bilaterally; Side stepping 10 feet x1 lap each direction with min VCs to increase step length for better hip strengthening; Monster walks diagonal with green tband around ankles x10 feet x2 laps forward/backward;   Standing: Heel/toe raises x20 Green tband bicep curls x10  BUE shoulder press with squat x10 with mod VCS for hand positioning for better shoulder strengthening;  Forward lunges x10 each foot in front;  Standing with green tband around post, thoracic rotation with hand touch hand (left/right) x10 each;  Patient required min-moderate verbal/tactile cues for correct exercise technique including cues to increase ROM and improve posture for better strengthening; He reports slight fatigue at end of session;                            PT Education - 01/02/17 1626    Education provided Yes   Education Details HEP advanced; strengthening  Person(s) Educated Patient   Methods Explanation;Verbal cues;Demonstration;Handout;Tactile cues   Comprehension Verbalized understanding;Returned demonstration;Verbal cues required;Tactile cues required             PT Long Term Goals - 12/26/16 1704      PT LONG TERM GOAL #1   Title Pt will become independent with HEP at home to increase functional independence with all ADLS.    Time 4   Period Weeks   Status On-going     PT LONG TERM GOAL #2   Title Pt will complete 5 x sit to stand in less than 15 seconds  to decrease risk of falls.     Time 4   Period Weeks   Status Achieved     PT LONG TERM GOAL #3   Title Pt will increase self reported LEFS score to 40/80 to decrease perception of disability and increase functional indepedence.     Time 4   Period Weeks   Status Not Met     PT LONG TERM GOAL #4   Title Pt will increase gait speed to 1.37ms to become a community ambulator and have greater functional mobility.     Time 4   Period Weeks   Status Achieved     PT LONG TERM GOAL #5   Title Pt will self report worse pain of 2/10  in RLE order to improve tolerance and promote greater functional mobility.     Time 4   Period Weeks   Status Achieved     PT LONG TERM GOAL #6   Title Pt will improve TUG score to <13 seconds to decrease falls risk and promote more functional mobility.     Time 4   Period Weeks   Status Achieved     PT LONG TERM GOAL #7   Title Pt will be able to get up and down from floor mod indep in order to garden at home safely.     Time 4   Period Weeks   Status Partially Met               Plan - 01/02/17 1712    Clinical Impression Statement Patient instructed in advanced LE strengthening and UE strengthening exercise. He required mod VCs and tactile cues to increase ROM/improve positioning for better exercise technique; Patient provided with green tband and handout to increase home compliance. Educated caregiver in correct exercise to improve compliance as well; Patient would benefit from additional skilled PT intervention to improve LE strength, balance/gait safety;    Rehab Potential Good   PT Frequency 2x / week   PT Duration 6 weeks   PT Treatment/Interventions Gait training;Therapeutic activities;Therapeutic exercise;Balance training;Neuromuscular re-education;Patient/family education;Stair training;Functional mobility training;Energy conservation;Manual techniques   PT Next Visit Plan ladder drills, obstacles, resisted walking, backwards steps     PT Home Exercise Plan sit to stands, 4 way hip, static balance on airex, bridges, clamshells     Consulted and Agree with Plan of Care Patient;Family member/caregiver      Patient will benefit from skilled therapeutic intervention in order to improve the following deficits and impairments:  Abnormal gait, Decreased balance, Decreased strength, Hypomobility, Pain, Decreased endurance, Decreased activity tolerance, Decreased mobility  Visit Diagnosis: Difficulty in walking, not elsewhere classified  Unsteadiness on feet  Muscle weakness (generalized)     Problem List Patient Active Problem List   Diagnosis Date Noted  . HBP (high blood pressure) 11/16/2015    Trotter,Margaret PT, DPT 01/02/2017, 5:14 PM  [place] [facility] [facility] SERVICES [address] [place], [place], [address] Phone: [phone]   Fax:  [phone]  Name: ZENO HICKEL MRN: [id number] Date of Birth: 1941/03/07

## 2017-01-02 NOTE — Patient Instructions (Addendum)
ABDUCTION: Sitting - Exercise Ball: Resistance Band (Active)   Sit with feet flat. With band tied around both legs, Lift right leg slightly and, against resistance band, draw it out to side. Complete __2_ sets of __10_ repetitions. Perform _2__ sessions per day.  Copyright  VHI. All rights reserved.  FLEXION: Sitting - Resistance Band (Active)   Sit, both feet flat. Have band tied around both legs above knees, lift right knee toward ceiling.Repeat with other knee Complete _2__ sets of _10__ repetitions. Perform _2__ sessions per day.  http://gtsc.exer.us/21   Knee Extension: Resisted (Sitting)   With band looped around right ankle and under other foot, straighten leg with ankle loop. Keep other leg bent to increase resistance. Repeat _10___ times per set. Do __2__ sets per session. Do _2___ sessions per day.  http://orth.exer.us/691   Copyright  VHI. All rights reserved.  Copyright  VHI. All rights reserved.  FLEXION: Sitting - Resistance Band (Active)   Sit with right foot flat. Have band tied around both feet, bend ankle, bringing toes toward head. Complete __2_ sets of __10_ repetitions. Perform _2__ sessions per day. Copyright  VHI. All rights reserved.  HIP / KNEE: Extension - Sit to Stand   Sitting, lean chest forward, raise hips up from surface. Straighten hips and knees. Weight bear equally on left and right sides. Backs of legs should not push off surface. __10_ reps per set, __2_ sets per day, _5__ days per week Use assistive device as needed.  Balance, Proprioception: Hip Abduction With Tubing   With tubing attached to both ankles, Standing holding onto counter, kick one leg out to side and then Return.  Repeat _10___ times  On each side.  Do ___2_ sessions per day.  http://cc.exer.us/20    Balance, Proprioception: Hip Extension With Tubing   With tubing tied around both legs, holding onto kitchen counter, swing leg back. Return. Repeat _10___  times . Do __2__ sessions per day.  http://cc.exer.us/19    Copyright  VHI. All rights reserved.  Band Walk: Side Stepping   Tie band around legs, around ankles. Step _10__ feet to one side, then step back to start. Repeat _2-3__ feet per session. Note: Small towel between band and skin eases rubbing.  http://plyo.exer.us/76   Copyright  VHI. All rights reserved.   Band Walk: Zig Zag   Tie green band around legs, around ankles;  Walk forward _both__ feet in a zig zag pattern. Without turning walk backward to start for one zig zag. Repeat _2-3__ zig zags per session.   http://plyo.exer.us/80   Copyright  VHI. All rights reserved.   Elbow Flexion: Resisted    With tubing wrapped around left fist and other end secured under foot (sitting or standing), curl arm up as far as possible. Repeat _10___ times per set. Do _2___ sets per session. Do __2__ sessions per day.  Copyright  VHI. All rights reserved.  Deep Squat    Stand with feet shoulder width apart and squat deeply, head and chest up. While holding green band, when standing up, push both arms overhead pushing against green band;  Repeat __10__ times per set. Do __2__ sets per session. Do __2__ sessions per day.  http://orth.exer.us/738   Copyright  VHI. All rights reserved.  Cranial Rotation: Resisted - Standing (Bar)    Standing with green band around post, Both arms up and apart, bring one hand over to other, pulling band. Be sure to focus on increasing trunk rotation; Repeat 10 times each side;  Copyright  VHI. All rights reserved.  Anterior    Step forward with one foot and bend both knees (hold onto counter for balance if needed), then step back together; Repeat 10 times each foot in front; To increase balance challenge, raise both arms overhead when lunging forward;  http://gglj.exer.us/4   Copyright  VHI. All rights reserved.

## 2017-01-04 DIAGNOSIS — I10 Essential (primary) hypertension: Secondary | ICD-10-CM | POA: Diagnosis not present

## 2017-01-04 DIAGNOSIS — R7303 Prediabetes: Secondary | ICD-10-CM | POA: Diagnosis not present

## 2017-01-04 DIAGNOSIS — E559 Vitamin D deficiency, unspecified: Secondary | ICD-10-CM | POA: Diagnosis not present

## 2017-01-04 DIAGNOSIS — E781 Pure hyperglyceridemia: Secondary | ICD-10-CM | POA: Diagnosis not present

## 2017-01-04 DIAGNOSIS — E669 Obesity, unspecified: Secondary | ICD-10-CM | POA: Diagnosis not present

## 2017-01-05 ENCOUNTER — Ambulatory Visit: Payer: PPO | Admitting: Physical Therapy

## 2017-01-10 ENCOUNTER — Encounter: Payer: PPO | Admitting: Physical Therapy

## 2017-01-12 ENCOUNTER — Encounter: Payer: PPO | Admitting: Physical Therapy

## 2017-01-16 ENCOUNTER — Ambulatory Visit: Payer: PPO | Admitting: Physical Therapy

## 2017-01-16 DIAGNOSIS — L820 Inflamed seborrheic keratosis: Secondary | ICD-10-CM | POA: Diagnosis not present

## 2017-01-16 DIAGNOSIS — B078 Other viral warts: Secondary | ICD-10-CM | POA: Diagnosis not present

## 2017-01-16 DIAGNOSIS — L570 Actinic keratosis: Secondary | ICD-10-CM | POA: Diagnosis not present

## 2017-01-16 DIAGNOSIS — L578 Other skin changes due to chronic exposure to nonionizing radiation: Secondary | ICD-10-CM | POA: Diagnosis not present

## 2017-01-19 ENCOUNTER — Encounter: Payer: PPO | Admitting: Physical Therapy

## 2017-03-09 DIAGNOSIS — E781 Pure hyperglyceridemia: Secondary | ICD-10-CM | POA: Diagnosis not present

## 2017-03-09 DIAGNOSIS — R4789 Other speech disturbances: Secondary | ICD-10-CM | POA: Diagnosis not present

## 2017-03-21 DIAGNOSIS — R600 Localized edema: Secondary | ICD-10-CM | POA: Diagnosis not present

## 2017-03-21 DIAGNOSIS — N2581 Secondary hyperparathyroidism of renal origin: Secondary | ICD-10-CM | POA: Diagnosis not present

## 2017-03-21 DIAGNOSIS — N183 Chronic kidney disease, stage 3 (moderate): Secondary | ICD-10-CM | POA: Diagnosis not present

## 2017-03-21 DIAGNOSIS — I10 Essential (primary) hypertension: Secondary | ICD-10-CM | POA: Diagnosis not present

## 2017-03-29 ENCOUNTER — Encounter: Payer: Self-pay | Admitting: Neurology

## 2017-03-29 ENCOUNTER — Ambulatory Visit (INDEPENDENT_AMBULATORY_CARE_PROVIDER_SITE_OTHER): Payer: PPO | Admitting: Neurology

## 2017-03-29 ENCOUNTER — Encounter (INDEPENDENT_AMBULATORY_CARE_PROVIDER_SITE_OTHER): Payer: Self-pay

## 2017-03-29 VITALS — BP 139/84 | HR 63 | Ht 68.0 in | Wt 211.8 lb

## 2017-03-29 DIAGNOSIS — G629 Polyneuropathy, unspecified: Secondary | ICD-10-CM

## 2017-03-29 DIAGNOSIS — I10 Essential (primary) hypertension: Secondary | ICD-10-CM | POA: Diagnosis not present

## 2017-03-29 DIAGNOSIS — R42 Dizziness and giddiness: Secondary | ICD-10-CM | POA: Diagnosis not present

## 2017-03-29 DIAGNOSIS — N183 Chronic kidney disease, stage 3 unspecified: Secondary | ICD-10-CM

## 2017-03-29 DIAGNOSIS — E782 Mixed hyperlipidemia: Secondary | ICD-10-CM

## 2017-03-29 NOTE — Patient Instructions (Addendum)
-   continue ASA and fenofibrate - will do EEG to rule out seizure - will repeat EMG and nerve conduction study to evaluate leg neuropathy - continue exercise  - recommend to use cane or walker to help for balance  - avoid fall - will try gabapentin 100mg  at night for 7 days and then twice a day for 7 day and then 3 times a day later. If extreme drowsy sleepy we will consider stop if needed.  - check BP at home and check orthostatic BP too - follow up in 2 months

## 2017-03-30 DIAGNOSIS — E782 Mixed hyperlipidemia: Secondary | ICD-10-CM | POA: Insufficient documentation

## 2017-03-30 DIAGNOSIS — N183 Chronic kidney disease, stage 3 unspecified: Secondary | ICD-10-CM | POA: Insufficient documentation

## 2017-03-30 MED ORDER — GABAPENTIN 100 MG PO CAPS: ORAL_CAPSULE | ORAL | 2 refills | Status: DC

## 2017-03-30 NOTE — Progress Notes (Signed)
NEUROLOGY CLINIC NEW PATIENT NOTE  NAME: DONTAI PEMBER DOB: 02-03-1941 REFERRING PHYSICIAN: Dion Body, MD  I saw Lia Hopping as a new consult in the neurovascular clinic today regarding  Chief Complaint  Patient presents with  . New Patient (Initial Visit)    Referral from [place] from [facility], MD for possible TIA, Pt seen by Dr. Melrose Nakayama neurolgist in [place], they were not happy with his care  .  HPI: FENIX RORKE is a 76 y.o. male with PMH of HTN, HLD, CKD stage III, pre-DM who presents as a new patient for spells with unclear etiology. Pt was accompanied by wife.  He and his wife stated in 2012 he had accident, dropped from 30 feet high and hit head on stone wall. One week after, he had episode of disorientation, hospitalized in [facility] for 3 days. Had extensive work up. 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 No intervention needed.   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 Symptoms lasted about 2 minutes. On EMS arrival, BP 130/80. EKG normal.   One month ago, he drove back from gym and wife found him in the car at driveway, could not think, difficulty with word finding and gibberish with words. Resolved shortly.   Not long after, he was found by wife that he collapsed in backyard garden, could not get up. He admitted lightheadedness.   Wife also described several episodes of stare off spells while sitting in chair watching TV, lasting 30 sec followed by falling asleep. Average once every two weeks. When asked if pt was sleepy and just simply dozed off, wife can not tell.   He has been following with Dr. Melrose Nakayama in [place] [facility] in [place] for b/l leg weakness and abnormal gait for one and 1.5 years. Found to have broad based  gait with shuffling gait. Had EMG showed chronic severe sensory polyneuropathy. Was treated with neurontin briefly one year ago, not feel effective. Was treated with sinemet but not feel any difference, sinemet discontinued, pt did not feel any difference. Had PT, helped some. Currently, still has broad based gait, unsteady on feet, difficulty with turns, tendency to fall. Now doing self exercise, and felt somewhat better slowly. Had hx of back pain s/p lumbar spine surgery.   Hx of familial HLD. High TG at 1032 in 12/2016, total chol 206. Was on gemfiboril but not effective, also tried niacin and fish oil, not effective. Now on fenofibrate for the last one month. Has HTN and CKD followed with Dr. Netty Starring, baseline Cre 1.5.    Past Medical History:  Diagnosis Date  . BPH (benign prostatic hyperplasia)   . Chronic kidney disease   . Elevated blood uric acid level   . GERD (gastroesophageal reflux disease)   . Hyperparathyroidism ([place])   . Hypertension   . TIA (transient ischemic attack) 2013   Past Surgical History:  Procedure Laterality Date  . L5 lamenectomy     had surgery but not the complete surgery  . l5-s1 laninectomy    . left inguinal hernia     History reviewed. No pertinent family history. Current Outpatient Prescriptions  Medication Sig Dispense Refill  . aspirin EC 81 MG tablet Take 81 mg by mouth daily.    Marland Kitchen  Cholecalciferol (VITAMIN D-1000 MAX ST) 1000 units tablet Take by mouth.    . fenofibrate 160 MG tablet     . Flaxseed, Linseed, (FLAXSEED OIL PO) Take 1,000 mg by mouth.     . terbinafine (LAMISIL) 250 MG tablet Take 1 tablet (250 mg total) by mouth daily. 30 tablet 0  . gabapentin (NEURONTIN) 100 MG capsule 100mg  at night daily for one week, 100mg  bid for one week and then 3 times a day. 90 capsule 2   No current facility-administered medications for this visit.    No Known Allergies Social History   Social History  . Marital status: Married    Spouse name:  N/A  . Number of children: N/A  . Years of education: N/A   Occupational History  . Not on file.   Social History Main Topics  . Smoking status: Never Smoker  . Smokeless tobacco: Never Used  . Alcohol use No  . Drug use: No  . Sexual activity: Not on file   Other Topics Concern  . Not on file   Social History Narrative  . No narrative on file    Review of Systems Full 14 system review of systems performed and notable only for those listed, all others are neg:  Constitutional:   Cardiovascular:  Swelling in legs Ear/Nose/Throat:   Skin:  Eyes:   Respiratory:  snoring Gastroitestinal:   Genitourinary: impotence Hematology/Lymphatic:   Endocrine:  Musculoskeletal:   Allergy/Immunology:   Neurological:  Confusion, numbness Psychiatric:  Sleep:    Physical Exam  Vitals:   03/29/17 0917  BP: 139/84  Pulse: 63    General - Well nourished, well developed, in no apparent distress.  Ophthalmologic - fundi not visualized due to small pupils.  Cardiovascular - Regular rate and rhythm.   Neck - supple, no nuchal rigidity.  Mental Status -  Level of arousal and orientation to time, place, and person were intact. Language including expression, naming, repetition, comprehension, reading, and writing was assessed and found intact. Fund of Knowledge was assessed and was intact.  Cranial Nerves II - XII - II - Visual field intact OU. III, IV, VI - Extraocular movements intact. V - Facial sensation intact bilaterally. VII - Facial movement intact bilaterally. VIII - Hearing & vestibular intact bilaterally. X - Palate elevates symmetrically. XI - Chin turning & shoulder shrug intact bilaterally. XII - Tongue protrusion intact.  Motor Strength - The patient's strength was normal in all extremities except BLE proximal 4/5 and pronator drift was absent.  Bulk was normal and fasciculations were absent.   Motor Tone - Muscle tone was assessed at the neck and appendages  and was normal except increased muscle tone RLE.  Reflexes - The patient's reflexes were 1+ BUEs, but diminished BLEs in all extremities and he had no pathological reflexes.  Sensory - Light touch, temperature/pinprick were symmetrical, decreased vibration BLEs below the knee, proprioception reserved bilaterally.    Coordination - The patient had normal movements in the hands and feet with no ataxia or dysmetria.  Tremor was absent.  Gait and Station - difficulty raise up from chair, broad based gait, small stride, unsteady, difficulty with turns.    Imaging  EMG 11/2016 - Abnormal study. There is electrodiagnostic evidence of a chronic,  severe, lower extremity sensory polyneuropathy.   MRI brain 09/2016 - No acute intracranial abnormality. Atrophy with mild chronic microvascular ischemia.  Lab Review Component     Latest Ref Rng & Units 11/20/2015  Cholesterol,  Total     100 - 199 mg/dL 197  Triglycerides     0 - 149 mg/dL 667 (HH)  HDL Cholesterol     >39 mg/dL 25 (L)  VLDL Cholesterol Cal     5 - 40 mg/dL Comment  LDL (calc)     0 - 99 mg/dL Comment  Total CHOL/HDL Ratio     0.0 - 5.0 ratio units 7.9 (H)  TSH     0.450 - 4.500 uIU/mL 2.540  Hemoglobin A1C     4.8 - 5.6 % 6.2 (H)     Assessment and Plan:   In summary, CONLEE SLITER is a 76 y.o. male with PMH of HTN, HLD, CKD presents as new pt for abnormal gait, frequent falls, spells of staring off and speech difficulty. He has been following with Dr. Melrose Nakayama for the abnormal gait, had MRI brain negative, but EMG found to have severe sensory polyneuropathy. Has tried gabapentin, sinemet but not effective. His spells of falls could be related to sensory neuropathy or orthostatic hypotension. Not consistent with spinal cord compression due to hyporeflexia and no b/b difficulty. Will repeat EMG and check orthostatic BPs. His spells of staring off concerning for sleepiness / doze off, but will do EEG to rule out seizure.  Recommend walker or cane and continue exercise.   - continue ASA and fenofibrate - will do EEG to rule out seizure - will repeat EMG/NCS to evaluate leg neuropathy - continue exercise  - recommend to use cane or walker to help for balance  - avoid fall - will try gabapentin 100mg  at night for 7 days and then twice a day for 7 day and then 3 times a day later. If extreme drowsy sleepy we will consider stop if needed.  - check BP at home and check orthostatic BP too - follow up in 2 months   Thank you very much for the opportunity to participate in the care of this patient.  Please do not hesitate to call if any questions or concerns arise.  Orders Placed This Encounter  Procedures  . NCV with EMG(electromyography)    Standing Status:   Future    Standing Expiration Date:   03/29/2018    Order Specific Question:   Where should this test be performed?    Answer:   [facility]  . EEG adult    Standing Status:   Future    Standing Expiration Date:   03/29/2018    Order Specific Question:   Where should this test be performed?    Answer:   [facility]    Meds ordered this encounter  Medications  . DISCONTD: aspirin EC 81 MG tablet    Sig: Take by mouth.  . fenofibrate 160 MG tablet  . Cholecalciferol (VITAMIN D-1000 MAX ST) 1000 units tablet    Sig: Take by mouth.  . Flaxseed, Linseed, (FLAXSEED OIL PO)    Sig: Take 1,000 mg by mouth.   . gabapentin (NEURONTIN) 100 MG capsule    Sig: 100mg  at night daily for one week, 100mg  bid for one week and then 3 times a day.    Dispense:  90 capsule    Refill:  2    Patient Instructions  - continue ASA and fenofibrate - will do EEG to rule out seizure - will repeat EMG and nerve conduction study to evaluate leg neuropathy - continue exercise  - recommend to use cane or walker to help for balance  - avoid fall -  will try gabapentin 100mg  at night for 7 days and then twice a day for 7 day and then 3 times a day later. If extreme drowsy sleepy we will  consider stop if needed.  - check BP at home and check orthostatic BP too - follow up in 2 months    Rosalin Hawking, MD PhD [facility] [address], [place], [place] [address] [phone]

## 2017-04-04 ENCOUNTER — Ambulatory Visit (INDEPENDENT_AMBULATORY_CARE_PROVIDER_SITE_OTHER): Payer: PPO | Admitting: Neurology

## 2017-04-04 DIAGNOSIS — R42 Dizziness and giddiness: Secondary | ICD-10-CM

## 2017-04-04 DIAGNOSIS — R410 Disorientation, unspecified: Secondary | ICD-10-CM | POA: Diagnosis not present

## 2017-04-04 NOTE — Procedures (Signed)
      History: Brendan Nguyen is a 75 year old gentleman with a history of hypertension, chronic renal insufficiency and prediabetes who has been noted to have events where he will stare off while watching TV, the episodes may last about 30 seconds averaging one every 2 weeks. The patient is being evaluated for these events.  This is a routine EEG. No skull defects are noted. Medications include aspirin, vitamin D supplementation, fenofibrate, gabapentin, and Lamisil.  EEG classification: Dysrhythmia grade 1 generalized, delta grade 1 generalized  Description of the recording: The background rhythm of this recording consists of a somewhat poorly modulated medium amplitude rhythm of 8 Hz that is reactive to eye opening and closure. As the record progresses, intermittent generalized episodes of slowing are seen lasting 1-2 seconds in a 2-4 Hz frequency. The episodes are seen throughout the entirety of the recording. Photic stimulation is performed, this results in a minimal but bilateral photic driving response. Hyperventilation is performed and does increase the background slowing in a generalized fashion. At no time during the recording does there appear to be evidence of spike or spike wave discharges or evidence of focal slowing. EKG monitor shows no evidence of cardiac rhythm abnormalities with a heart rate of 66.  Impression: This is an abnormal EEG recording secondary to episodic generalized theta and delta frequency slowing. This is a nonspecific recording, suggesting bihemispheric dysfunction or dysfunction of the deep midline nuclei. This may be seen with a toxic or metabolic encephalopathy. No evidence of epileptiform discharges are seen.

## 2017-04-13 ENCOUNTER — Telehealth: Payer: Self-pay

## 2017-04-13 NOTE — Telephone Encounter (Signed)
Left vm for patient to call back about EEG results. LFT VM on home phone.

## 2017-04-13 NOTE — Telephone Encounter (Signed)
-----   Message from Rosalin Hawking, MD sent at 04/05/2017  5:23 PM EDT ----- Could you please let the patient know that the EEG test done recently in our office was negative for seizure but showed generalized slow brain activity which is nonspecific and no need special treatment. Please continue current treatment. Will wait for the nerve conduction test result. Thanks.  Rosalin Hawking, MD PhD Stroke Neurology 04/05/2017 5:22 PM

## 2017-04-17 ENCOUNTER — Encounter: Payer: PPO | Admitting: Neurology

## 2017-04-17 NOTE — Telephone Encounter (Signed)
RN return patients wife Brendan Nguyen on dpr form. Rn stated the EEG was negative for any seizures, but showed  Generalized slow brain activity which is non specific,and  No need for special treatment. Per Dr. Erlinda Hong sometimes the slow brain activity comes with age at times. Continue treatment plan, and get EMG on 05/03/2017. PTs wife husband is taking gabapentin prescribed by Dr. Erlinda Hong. Pts wife verbalized understanding.

## 2017-04-17 NOTE — Telephone Encounter (Signed)
Patients wife called office returning RN's call.  Please call after 3:30pm.

## 2017-04-17 NOTE — Telephone Encounter (Signed)
Pt wife called and said [phone] is the best# if no sginal on her phone u can call the [phone]

## 2017-05-02 ENCOUNTER — Encounter: Payer: PPO | Admitting: Neurology

## 2017-05-03 ENCOUNTER — Ambulatory Visit (INDEPENDENT_AMBULATORY_CARE_PROVIDER_SITE_OTHER): Payer: PPO | Admitting: Neurology

## 2017-05-03 ENCOUNTER — Ambulatory Visit (INDEPENDENT_AMBULATORY_CARE_PROVIDER_SITE_OTHER): Payer: Self-pay | Admitting: Neurology

## 2017-05-03 ENCOUNTER — Encounter: Payer: Self-pay | Admitting: Neurology

## 2017-05-03 DIAGNOSIS — G629 Polyneuropathy, unspecified: Secondary | ICD-10-CM

## 2017-05-03 NOTE — Progress Notes (Signed)
Please refer to EMG and nerve conduction study procedure note. 

## 2017-05-03 NOTE — Procedures (Signed)
     HISTORY:  Brendan Nguyen is a 76 year old gentleman with a two-year history of numbness from the knees down to the feet. The patient reports some gait instability as well. He is being evaluated for a possible neuropathy or a lumbosacral radiculopathy. He has had prior low back surgery in the past.  NERVE CONDUCTION STUDIES:  Nerve conduction studies were performed on both lower extremities. The distal motor latencies and motor amplitudes for the peroneal nerves were normal bilaterally. The distal motor latencies for the posterior tibial nerves were prolonged bilaterally with low motor amplitudes for these nerves bilaterally. Slight slowing for the posterior tibial nerves were noted bilaterally, nerve conduction velocities for the peroneal nerves were normal bilaterally. The sensory latencies for the sural and peroneal nerves were unobtainable bilaterally. The F wave latencies for the posterior tibial nerves were prolonged bilaterally. The H reflex latencies were prolonged on the left and unobtainable on the right.  EMG STUDIES:  EMG study was performed on the right lower extremity:  The tibialis anterior muscle reveals 2 to 4K motor units with full recruitment. No fibrillations or positive waves were seen. The peroneus tertius muscle reveals 2 to 4K motor units with slightly decreased recruitment. No fibrillations or positive waves were seen. The medial gastrocnemius muscle reveals 1 to 3K motor units with full recruitment. No fibrillations or positive waves were seen. The vastus lateralis muscle reveals 2 to 4K motor units with full recruitment. No fibrillations or positive waves were seen. The iliopsoas muscle reveals 2 to 4K motor units with full recruitment. No fibrillations or positive waves were seen. The biceps femoris muscle (long head) reveals 2 to 4K motor units with full recruitment. No fibrillations or positive waves were seen. The lumbosacral paraspinal muscles were tested at 3  levels, and revealed no abnormalities of insertional activity at all 3 levels tested. There was good relaxation.   IMPRESSION:  Nerve conduction studies done on both lower extremities shows a relatively mild, primarily sensory peripheral neuropathy. EMG evaluation of the right lower extremity shows minimal distal chronic changes of denervation that would be consistent with a peripheral neuropathy. There is no evidence of an overlying lumbosacral radiculopathy.  Jill Alexanders MD 05/03/2017 3:54 PM  [facility] [address] [place], [place] [address]  Phone [phone] Fax [phone]

## 2017-05-04 ENCOUNTER — Telehealth: Payer: Self-pay

## 2017-05-04 NOTE — Telephone Encounter (Signed)
Rn call patients wife about the EMG results. Rn stated the nerve conduction test is consistent with sensory neuropathy.This explains the imbalance in his feet. Continue treatment plan, and avoid fall. Pt wife states her husband will not use the cane. She will encourage him.PTs wife wants Dr. Erlinda Hong to compare the EMG from 11/2016 to the one done in  May 2018 by Dr. Jannifer Franklin. RN stated Dr. Erlinda Hong will contact her. Pts wife verbalized understanding.  Pts wife stated Dr.WIllis mention something about her husband back. She wanted to know was that mention in the report. PTs wife wants Dr. Erlinda Hong to speak with Dr. Jannifer Franklin about her her husband possible having a issue with the back.Also she states her husband does not use the cane for support, and has fallen. She is encouraging him at all times. Pt has appt in July 2018. Rn stated message will be sent to Dr. Erlinda Hong. PT verbalized understanding.

## 2017-05-04 NOTE — Telephone Encounter (Signed)
Rn reminded patients wife that Dr. Erlinda Hong is working in the hospital this week. He will return to the office on Tuesday because of the holidays. PTs wife verbalized understanding. The EMG done at [facility] on 11/16/2016 in care everywhere.

## 2017-05-04 NOTE — Telephone Encounter (Signed)
Please let wife know that last year the EMG showed severe neuropathy. This time Dr. Jannifer Franklin only saw mild neuropathy, seems EMG better. However, symptoms not necessarily better as EMG did. So not need to compare with different studies but follow with clinical picture.   The EMG did not show any nerve impingement at the back so there is no concern of the back from neurological standpoint. Continue current treatment plan. Thanks  Rosalin Hawking, MD PhD Stroke Neurology 05/04/2017 11:54 AM

## 2017-05-04 NOTE — Telephone Encounter (Signed)
-----   Message from Rosalin Hawking, MD sent at 05/03/2017  5:09 PM EDT ----- Could you please let the patient know that the nerve conduction test done recently in our office consistent with sensory neuropathy which is able to explain his imbalance on his feet. Please continue current treatment and avoid fall. Thanks.   Rosalin Hawking, MD PhD Stroke Neurology 05/03/2017 5:09 PM

## 2017-05-09 NOTE — Telephone Encounter (Signed)
Rn call patients wife Brendan Nguyen about her husbands EMG results. Rn gave the message below from Dr. Erlinda Hong:  Status: Signed    Please let wife know that last year the EMG showed severe neuropathy. This time Dr. Jannifer Franklin only saw mild neuropathy, seems EMG better. However, symptoms not necessarily better as EMG did. So not need to compare with different studies but follow with clinical picture.   The EMG did not show any nerve impingement at the back so there is no concern of the back from neurological standpoint. Continue current treatment plan. Thanks  Rosalin Hawking, MD PhD Stroke Neurology 05/04/2017 11:54 AM       Pts wife was happy that it went from severe neuropathy to mild neuropathy with the last EMG done in May 2018. Pts wife verbalized understanding. She wanted could she increase the gabapentin to 4 times a day.Pt is taking 3 times a day. Rn stated a message will be sent to Dr. Erlinda Hong.

## 2017-05-10 NOTE — Telephone Encounter (Signed)
Yes. 4 times a day is OK with me if pt feels benefit. Thanks.   Rosalin Hawking, MD PhD Stroke Neurology 05/10/2017 7:20 AM

## 2017-05-10 NOTE — Telephone Encounter (Signed)
Rn call patients wife that he can take gabapentin 4 times a day if its going to benefit him. PTs wife verbalized understanding. Pts wife wanted the EMG results sent to patients PCP.

## 2017-05-10 NOTE — Telephone Encounter (Signed)
Rn call patients PCP Dr. Netty Starring MD to fax emg results. THe PCP is part of [facility] and is on epic. RN was given fax number of [phone]. EMG fax to PCP and receive.

## 2017-05-24 DIAGNOSIS — M7672 Peroneal tendinitis, left leg: Secondary | ICD-10-CM | POA: Diagnosis not present

## 2017-06-21 ENCOUNTER — Encounter: Payer: Self-pay | Admitting: Neurology

## 2017-06-21 ENCOUNTER — Ambulatory Visit (INDEPENDENT_AMBULATORY_CARE_PROVIDER_SITE_OTHER): Payer: PPO | Admitting: Neurology

## 2017-06-21 VITALS — BP 121/74 | HR 68 | Wt 211.4 lb

## 2017-06-21 DIAGNOSIS — G629 Polyneuropathy, unspecified: Secondary | ICD-10-CM | POA: Diagnosis not present

## 2017-06-21 DIAGNOSIS — R269 Unspecified abnormalities of gait and mobility: Secondary | ICD-10-CM | POA: Diagnosis not present

## 2017-06-21 MED ORDER — PYRIDOSTIGMINE BROMIDE 60 MG PO TABS: ORAL_TABLET | ORAL | 2 refills | Status: DC

## 2017-06-21 NOTE — Patient Instructions (Addendum)
-   continue ASA and fenofibrate - will do EMG for repetitive stimiulation - will check blood works today to rule out myasthenia gravis   - will do right hip X-ray - recommend to use cane or walker to help for balance  - avoid fall - will do mestinon trial to see if helps. Take half tablet 3 times a day for 5 days and then one tab 3 times a day.  - check BP at home  - follow up in 2 months

## 2017-06-21 NOTE — Progress Notes (Signed)
NEUROLOGY CLINIC NEW PATIENT NOTE  NAME: Brendan Nguyen DOB: [birth year] REFERRING PHYSICIAN: Dion Body, MD  I saw Brendan Nguyen as a new consult in the neurovascular clinic today regarding  No chief complaint on file. Marland Kitchen  HPI: Brendan Nguyen is a 76 y.o. male with PMH of HTN, HLD, CKD stage III, pre-DM who presents as a new patient for spells with unclear etiology. Pt was accompanied by wife.  He and his wife stated in 2012 he had accident, dropped from 30 feet high and hit head on stone wall. One week after, he had episode of disorientation, hospitalized in [facility] for 3 days. Had extensive work up. 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 No intervention needed.   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 Symptoms lasted about 2 minutes. On EMS arrival, BP 130/80. EKG normal.   One month ago, he drove back from gym and wife found him in the car at driveway, could not think, difficulty with word finding and gibberish with words. Resolved shortly.   Not long after, he was found by wife that he collapsed in backyard garden, could not get up. He admitted lightheadedness.   Wife also described several episodes of stare off spells while sitting in chair watching TV, lasting 30 sec followed by falling asleep. Average once every two weeks. When asked if pt was sleepy and just simply dozed off, wife can not tell.   He has been following with Dr. Melrose Nakayama in [facility] in [place] for b/l leg weakness and abnormal gait for one and 1.5 years. Found to have broad based gait with shuffling gait. Had EMG showed chronic severe sensory polyneuropathy. Was treated with neurontin briefly one year ago, not feel effective. Was treated with sinemet but not feel any difference, sinemet discontinued,  pt did not feel any difference. Had PT, helped some. Currently, still has broad based gait, unsteady on feet, difficulty with turns, tendency to fall. Now doing self exercise, and felt somewhat better slowly. Had hx of back pain s/p lumbar spine surgery.   Hx of familial HLD. High TG at 1032 in 12/2016, total chol 206. Was on gemfiboril but not effective, also tried niacin and fish oil, not effective. Now on fenofibrate for the last one month. Has HTN and CKD followed with Dr. Netty Starring, baseline Cre 1.5.   Interval history: During the interval time, pt has been doing the same. Had EEG done showed episodic generalized slowing but no seizure. EMG showed relatively mild primary sensory peripheral neuropathy. Increased gabapentin to 4 times a day. However, pt felt no improvement with neurontin but sleepy drowsy side effects. Neurontin was stopped by himself. No more staring spells or speech difficulty.   He still has broad based gait and difficulty getting out from chair. Complains of right hip pain and 4/5 right hip flexion, not able to do HTS on the left. However, he admitted that his symptoms better in the am, worse at night. Walking initially better and becomes weaker and weaker on waking. Denies any double vision, ptosis, arm weakness.    Past Medical History:  Diagnosis Date  . BPH (benign prostatic hyperplasia)   . Chronic kidney disease   . Elevated blood uric acid level   . GERD (gastroesophageal reflux disease)   .  Hyperparathyroidism ([place])   . Hypertension   . TIA (transient ischemic attack) 2013   Past Surgical History:  Procedure Laterality Date  . L5 lamenectomy     had surgery but not the complete surgery  . l5-s1 laninectomy    . left inguinal hernia     No family history on file. Current Outpatient Prescriptions  Medication Sig Dispense Refill  . aspirin EC 81 MG tablet Take 81 mg by mouth daily.    . Cholecalciferol (VITAMIN D-1000 MAX ST) 1000 units tablet Take by  mouth.    . fenofibrate 160 MG tablet     . Flaxseed, Linseed, (FLAXSEED OIL PO) Take 1,000 mg by mouth.     . gabapentin (NEURONTIN) 100 MG capsule 100mg  at night daily for one week, 100mg  bid for one week and then 3 times a day. 90 capsule 2  . carbidopa-levodopa (SINEMET IR) 25-100 MG tablet carbidopa 25 mg-levodopa 100 mg tablet    . gemfibrozil (LOPID) 600 MG tablet gemfibrozil 600 mg tablet    . HYDROcodone-acetaminophen (NORCO/VICODIN) 5-325 MG tablet hydrocodone 5 mg-acetaminophen 325 mg tablet  TAKE 1 TO 2 TABLETS BY MOUTH EVERY 4 TO 6 HOURS AS NEEDED FOR PAIN    . lisinopril (PRINIVIL,ZESTRIL) 10 MG tablet lisinopril 10 mg tablet    . losartan (COZAAR) 25 MG tablet losartan 25 mg tablet  TAKE 1 TABLET (25 MG TOTAL) BY MOUTH DAILY.    . meloxicam (MOBIC) 15 MG tablet meloxicam 15 mg tablet  TAKE 1 TABLET EVERY DAY    . metaxalone (SKELAXIN) 800 MG tablet metaxalone 800 mg tablet  TAKE 1 TABLET BY MOUTH 3 TIMES A DAY AS NEEDED    . niacin (NIASPAN) 500 MG CR tablet niacin ER 500 mg tablet,extended release 24 hr    . terbinafine (LAMISIL) 250 MG tablet Take 1 tablet (250 mg total) by mouth daily. 30 tablet 0   No current facility-administered medications for this visit.    No Known Allergies Social History   Social History  . Marital status: Married    Spouse name: N/A  . Number of children: N/A  . Years of education: N/A   Occupational History  . Not on file.   Social History Main Topics  . Smoking status: Never Smoker  . Smokeless tobacco: Never Used  . Alcohol use No  . Drug use: No  . Sexual activity: Not on file   Other Topics Concern  . Not on file   Social History Narrative  . No narrative on file    Review of Systems Full 14 system review of systems performed and notable only for those listed, all others are neg:  Constitutional:   Cardiovascular:   Ear/Nose/Throat:   Skin:  Eyes:   Respiratory:   Gastroitestinal:   Genitourinary:    Hematology/Lymphatic:   Endocrine:  Musculoskeletal:  Walking difficulty Allergy/Immunology:   Neurological:   Psychiatric:  Sleep:    Physical Exam  There were no vitals filed for this visit.  General - Well nourished, well developed, in no apparent distress.  Ophthalmologic - fundi not visualized due to small pupils.  Cardiovascular - Regular rate and rhythm.   Neck - supple, no nuchal rigidity.  Mental Status -  Level of arousal and orientation to time, place, and person were intact. Language including expression, naming, repetition, comprehension, reading, and writing was assessed and found intact. Fund of Knowledge was assessed and was intact.  Cranial Nerves II - XII - II -  Visual field intact OU. III, IV, VI - Extraocular movements intact. V - Facial sensation intact bilaterally. VII - Facial movement intact bilaterally. VIII - Hearing & vestibular intact bilaterally. X - Palate elevates symmetrically. XI - Chin turning & shoulder shrug intact bilaterally. XII - Tongue protrusion intact.  Motor Strength - The patient's strength was normal in all extremities except RLE proximal 4/5 due to hip pain and pronator drift was absent.  Bulk was normal and fasciculations were absent.   Motor Tone - Muscle tone was assessed at the neck and appendages and was normal bilaterally.  Reflexes - The patient's reflexes were 1+ BUEs, but diminished BLEs in all extremities and he had no pathological reflexes.  Sensory - Light touch, temperature/pinprick were symmetrical, mildly decreased vibration BLEs below the knee, proprioception reserved bilaterally.    Coordination - The patient had normal movements in the hands and feet with no ataxia or dysmetria, not able to do HTS at right due to hip pain.  Tremor was absent.  Gait and Station - difficulty raise up from chair, broad based gait, small stride, unsteady, difficulty with turns. Seems getting more difficult as walking goes on     Imaging  EMG 11/2016 - Abnormal study. There is electrodiagnostic evidence of a chronic,  severe, lower extremity sensory polyneuropathy.   MRI brain 09/2016 - No acute intracranial abnormality. Atrophy with mild chronic  microvascular ischemia.  EEG 04/04/17 - This is an abnormal EEG recording secondary to episodic generalized theta and delta frequency slowing. This is a nonspecific recording, suggesting bihemispheric dysfunction or dysfunction of the deep midline nuclei. This may be seen with a toxic or metabolic encephalopathy. No evidence of epileptiform discharges are seen.  EMG 05/03/17 - Nerve conduction studies done on both lower extremities shows a relatively mild, primarily sensory peripheral neuropathy. EMG evaluation of the right lower extremity shows minimal distal chronic changes of denervation that would be consistent with a peripheral neuropathy. There is no evidence of an overlying lumbosacral radiculopathy.   Lab Review Component     Latest Ref Rng & Units 11/20/2015  Cholesterol, Total     100 - 199 mg/dL 197  Triglycerides     0 - 149 mg/dL 667 (HH)  HDL Cholesterol     >39 mg/dL 25 (L)  VLDL Cholesterol Cal     5 - 40 mg/dL Comment  LDL (calc)     0 - 99 mg/dL Comment  Total CHOL/HDL Ratio     0.0 - 5.0 ratio units 7.9 (H)  TSH     0.450 - 4.500 uIU/mL 2.540  Hemoglobin A1C     4.8 - 5.6 % 6.2 (H)     Assessment:   In summary, Brendan Nguyen is a 76 y.o. male with PMH of HTN, HLD, CKD presents as for abnormal gait, frequent falls, spells of staring off and speech difficulty. He has been following with Dr. Melrose Nakayama for the abnormal gait, had MRI brain negative, but EMG found to have severe sensory polyneuropathy. Has tried gabapentin, sinemet but not effective. His spells of falls could be related to sensory neuropathy or orthostatic hypotension. Not consistent with spinal cord compression due to hyporeflexia and no b/b difficulty. Repeat EMG only showed  relatively mild primary sensory peripheral neuropathy. EEG no seizure. Neuro exam showed sensory basically preserved, not consistent with sensory neuropathy or B12 difficiency. Has right hip pain. 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 Will try mestinon, check AchR Abs, B12,  EMG repetitive stimulation, as well as right hip X-ray.   Plan: - continue ASA and fenofibrate - will do EMG for repetitive stimiulation - will check AchR Abs, B12 level to rule out MG or B12 difficiency   - will do right hip X-ray - recommend to use cane or walker to help for balance  - avoid fall - will do mestinon trial to see if helps. Take half tablet 3 times a day for 5 days and then one tab 3 times a day.  - check BP at home  - follow up in 2 months   I spent more than 25 minutes of face to face time with the patient. Greater than 50% of time was spent in counseling and coordination of care. We discussed DDx for abnormal gait, further work up and mestinon trial.    No orders of the defined types were placed in this encounter.   Meds ordered this encounter  Medications  . HYDROcodone-acetaminophen (NORCO/VICODIN) 5-325 MG tablet    Sig: hydrocodone 5 mg-acetaminophen 325 mg tablet  TAKE 1 TO 2 TABLETS BY MOUTH EVERY 4 TO 6 HOURS AS NEEDED FOR PAIN  . gemfibrozil (LOPID) 600 MG tablet    Sig: gemfibrozil 600 mg tablet  . niacin (NIASPAN) 500 MG CR tablet    Sig: niacin ER 500 mg tablet,extended release 24 hr  . lisinopril (PRINIVIL,ZESTRIL) 10 MG tablet    Sig: lisinopril 10 mg tablet  . losartan (COZAAR) 25 MG tablet    Sig: losartan 25 mg tablet  TAKE 1 TABLET (25 MG TOTAL) BY MOUTH DAILY.  . carbidopa-levodopa (SINEMET IR) 25-100 MG tablet    Sig: carbidopa 25 mg-levodopa 100 mg tablet  . meloxicam (MOBIC) 15 MG tablet    Sig: meloxicam 15 mg tablet  TAKE 1 TABLET EVERY DAY  . metaxalone (SKELAXIN) 800 MG tablet    Sig: metaxalone 800 mg tablet  TAKE 1 TABLET BY  MOUTH 3 TIMES A DAY AS NEEDED    There are no Patient Instructions on file for this visit.  Rosalin Hawking, MD PhD [facility] [address] [place], [place] [address] [phone]

## 2017-06-23 DIAGNOSIS — E781 Pure hyperglyceridemia: Secondary | ICD-10-CM | POA: Diagnosis not present

## 2017-06-23 DIAGNOSIS — I10 Essential (primary) hypertension: Secondary | ICD-10-CM | POA: Diagnosis not present

## 2017-06-23 DIAGNOSIS — E559 Vitamin D deficiency, unspecified: Secondary | ICD-10-CM | POA: Diagnosis not present

## 2017-06-23 DIAGNOSIS — R7303 Prediabetes: Secondary | ICD-10-CM | POA: Diagnosis not present

## 2017-06-23 DIAGNOSIS — Z0000 Encounter for general adult medical examination without abnormal findings: Secondary | ICD-10-CM | POA: Diagnosis not present

## 2017-06-30 DIAGNOSIS — R7303 Prediabetes: Secondary | ICD-10-CM | POA: Diagnosis not present

## 2017-06-30 DIAGNOSIS — N183 Chronic kidney disease, stage 3 (moderate): Secondary | ICD-10-CM | POA: Diagnosis not present

## 2017-06-30 DIAGNOSIS — I10 Essential (primary) hypertension: Secondary | ICD-10-CM | POA: Diagnosis not present

## 2017-06-30 DIAGNOSIS — Z0000 Encounter for general adult medical examination without abnormal findings: Secondary | ICD-10-CM | POA: Diagnosis not present

## 2017-06-30 DIAGNOSIS — E781 Pure hyperglyceridemia: Secondary | ICD-10-CM | POA: Diagnosis not present

## 2017-06-30 DIAGNOSIS — E669 Obesity, unspecified: Secondary | ICD-10-CM | POA: Diagnosis not present

## 2017-06-30 DIAGNOSIS — E559 Vitamin D deficiency, unspecified: Secondary | ICD-10-CM | POA: Diagnosis not present

## 2017-07-02 LAB — ACETYLCHOLINE RECEPTOR, BINDING: AChR Binding Ab, Serum: <0.03 nmol/L (ref 0.00–0.24)

## 2017-07-02 LAB — METHYLMALONIC ACID, SERUM: METHYLMALONIC ACID: 342 nmol/L (ref 0–378)

## 2017-07-02 LAB — HOMOCYSTEINE: HOMOCYSTEINE: 24 umol/L — AB (ref 0.0–15.0)

## 2017-07-02 LAB — VITAMIN B12: Vitamin B-12: 568 pg/mL (ref 232–1245)

## 2017-07-02 LAB — VITAMIN B1: THIAMINE: 134.8 nmol/L (ref 66.5–200.0)

## 2017-07-02 LAB — VITAMIN B6: Vitamin B6: 12.2 ug/L (ref 5.3–46.7)

## 2017-07-02 LAB — ACETYLCHOLINE RECEPTOR, MODULATING

## 2017-07-02 LAB — FOLATE: Folate: 10.7 ng/mL

## 2017-07-02 LAB — ACETYLCHOLINE RECEPTOR, BLOCKING: Acetylchol Block Ab: 15 % (ref 0–25)

## 2017-07-04 ENCOUNTER — Other Ambulatory Visit: Payer: Self-pay | Admitting: Neurology

## 2017-07-04 DIAGNOSIS — R269 Unspecified abnormalities of gait and mobility: Secondary | ICD-10-CM

## 2017-07-04 NOTE — Progress Notes (Addendum)
If patient or wife calls back, I have already spoke with the wife. Pt does not need an appt for the xrays of the hip. [facility] does walk in, and they cut off at 0330pm.  RN call patients wife that Dr. Erlinda Hong order a MRi of cervical and thoraic spine. Pts wife her husband may not be happy about that having another test.PTs wife stated that Dr, Erlinda Hong order a Xray of the hip,and no one call her about scheduling.  Rn stated if patients are having xrays they dont need an appt,.  Imaging takes walk in for x rays.Rn stated Dr. Erlinda Hong wanted these scans done before he refers pt for a second opinion. Pts wife stated she was in [place]and would like a call on her cell phone.

## 2017-07-04 NOTE — Progress Notes (Signed)
Please let the pt know that I have ordered MRI Cervical and Thoracic spine for him to further evaluate the abnormal gait. After that I will refer him to Dr. Krista Blue for second opinion. Thanks.   Rosalin Hawking, MD PhD Stroke Neurology 07/04/2017 4:58 PM  Orders Placed This Encounter  Procedures  . MR CERVICAL SPINE W WO CONTRAST    Standing Status:   Future    Standing Expiration Date:   09/05/2018    Order Specific Question:   Reason for Exam (SYMPTOM  OR DIAGNOSIS REQUIRED)    Answer:   gait abnormalities    Order Specific Question:   Preferred imaging location?    Answer:   [facility] (table limit-550lbs)    Order Specific Question:   Does the patient have a pacemaker or implanted devices?    Answer:   No    Order Specific Question:   What is the patient's sedation requirement?    Answer:   No Sedation  . MR THORACIC SPINE W WO CONTRAST    Standing Status:   Future    Standing Expiration Date:   09/04/2018    Order Specific Question:   GRA to provide read?    Answer:   Yes    Order Specific Question:   If indicated for the ordered procedure, I authorize the administration of contrast media per Radiology protocol    Answer:   Yes    Order Specific Question:   Reason for Exam (SYMPTOM  OR DIAGNOSIS REQUIRED)    Answer:   gait abnormalities    Order Specific Question:   What is the patient's sedation requirement?    Answer:   No Sedation    Order Specific Question:   Does the patient have a pacemaker or implanted devices?    Answer:   No    Order Specific Question:   Preferred imaging location?    Answer:   [facility] (table limit-550lbs)    Order Specific Question:   Radiology Contrast Protocol - do NOT remove file path    Answer:   [email or web address]

## 2017-07-19 ENCOUNTER — Encounter (INDEPENDENT_AMBULATORY_CARE_PROVIDER_SITE_OTHER): Payer: Self-pay

## 2017-07-19 ENCOUNTER — Ambulatory Visit (INDEPENDENT_AMBULATORY_CARE_PROVIDER_SITE_OTHER): Payer: PPO | Admitting: Neurology

## 2017-07-19 DIAGNOSIS — R269 Unspecified abnormalities of gait and mobility: Secondary | ICD-10-CM

## 2017-07-19 DIAGNOSIS — Z0289 Encounter for other administrative examinations: Secondary | ICD-10-CM

## 2017-07-19 DIAGNOSIS — G629 Polyneuropathy, unspecified: Secondary | ICD-10-CM

## 2017-07-19 NOTE — Procedures (Signed)
     HISTORY:  Brendan Nguyen is a 76 year old gentleman with a history of some fatigable weakness of the lower extremities. The patient is being evaluated for this issue, he is being evaluated for a neuromuscular transmission disorder.  NERVE CONDUCTION STUDIES:  Standard nerve conduction studies were not done today, a repetitive nerve stimulation study was performed on the right arm with pickup on the abductor digiti minimi muscle.  This muscle was exercised for 1 minute, 3 Hz stimulation was administered at 30 seconds, 1 minute, 2 minutes, 3 minutes, and 4 minutes following exercise. No evidence of an incremental or decremental response was seen.  EMG STUDIES:  EMG study was performed on the left lower extremity:  The tibialis anterior muscle reveals 2 to 4K motor units with full recruitment. No fibrillations or positive waves were seen. The peroneus tertius muscle reveals 2 to 4K motor units with slightly decreased recruitment. No fibrillations or positive waves were seen. The medial gastrocnemius muscle reveals 1 to 3K motor units with slightly decreased recruitment. No fibrillations or positive waves were seen. The vastus lateralis muscle reveals 2 to 4K motor units with full recruitment. No fibrillations or positive waves were seen. The iliopsoas muscle reveals 2 to 4K motor units with full recruitment. No fibrillations or positive waves were seen.   IMPRESSION:  The repetitive nerve stimulation study done on the right upper extremity was unremarkable, no evidence of a neuromuscular transmission disorder was seen. EMG evaluation of the left lower extremity was notable for very minimal distal chronic stable signs of denervation, consistent with the prior documented history of peripheral neuropathy.  Jill Alexanders MD 07/19/2017 6:35 PM  [facility] [address] [place],  [address]  Phone [phone] Fax [phone]

## 2017-07-25 DIAGNOSIS — N183 Chronic kidney disease, stage 3 (moderate): Secondary | ICD-10-CM | POA: Diagnosis not present

## 2017-07-25 DIAGNOSIS — N2581 Secondary hyperparathyroidism of renal origin: Secondary | ICD-10-CM | POA: Diagnosis not present

## 2017-07-25 DIAGNOSIS — R600 Localized edema: Secondary | ICD-10-CM | POA: Diagnosis not present

## 2017-07-25 DIAGNOSIS — I10 Essential (primary) hypertension: Secondary | ICD-10-CM | POA: Diagnosis not present

## 2017-07-27 ENCOUNTER — Other Ambulatory Visit (HOSPITAL_COMMUNITY): Payer: Self-pay | Admitting: Nephrology

## 2017-07-27 ENCOUNTER — Other Ambulatory Visit: Payer: Self-pay | Admitting: Nephrology

## 2017-07-27 NOTE — Progress Notes (Signed)
     Rep Stim    Anatomy / Train Rate Ampl. Ampl 4-1 Fac Ampl Area Area 4-1 Fac Area Time   Hz mV % % mVms % %   L Abductor digiti minimi (manus) - (Ulnar)  Baseline @1Hz  1 0.6 16.9 100 0.7 -35.3 100 0:00:00  R Abductor digiti minimi (manus) - (Ulnar)  Post Exercise @0 :00 3 10.1 -1.7 100 29.6 -0.2 100 0:00:00  @ 0:30 3 8.9 -3.1 88.4 25.6 5.9 86.4 0:00:20  @ 1:00 3 9.9 7 98.8 29.0 7.9 97.7 0:01:12  @ 2:00 3 10.5 1.2 105 29.5 4.7 99.4 0:02:17  @ 3:00 3 10.9 2.7 108 30.5 10.3 103 0:03:30  @ 4:00 3 10.9 4.3 108 30.1 10.1 102 0:04:24         EMG full

## 2017-08-03 ENCOUNTER — Ambulatory Visit: Payer: PPO

## 2017-08-04 DIAGNOSIS — N5201 Erectile dysfunction due to arterial insufficiency: Secondary | ICD-10-CM | POA: Diagnosis not present

## 2017-08-04 DIAGNOSIS — B349 Viral infection, unspecified: Secondary | ICD-10-CM | POA: Diagnosis not present

## 2017-08-04 DIAGNOSIS — Z8551 Personal history of malignant neoplasm of bladder: Secondary | ICD-10-CM | POA: Diagnosis not present

## 2017-08-04 DIAGNOSIS — N401 Enlarged prostate with lower urinary tract symptoms: Secondary | ICD-10-CM | POA: Diagnosis not present

## 2017-08-07 DIAGNOSIS — M7672 Peroneal tendinitis, left leg: Secondary | ICD-10-CM | POA: Diagnosis not present

## 2017-08-07 DIAGNOSIS — M7670 Peroneal tendinitis, unspecified leg: Secondary | ICD-10-CM | POA: Insufficient documentation

## 2017-08-09 ENCOUNTER — Other Ambulatory Visit: Payer: Self-pay | Admitting: Orthopedic Surgery

## 2017-08-09 DIAGNOSIS — M7672 Peroneal tendinitis, left leg: Secondary | ICD-10-CM

## 2017-08-11 ENCOUNTER — Inpatient Hospital Stay: Admission: RE | Admit: 2017-08-11 | Payer: PPO | Source: Ambulatory Visit

## 2017-08-11 ENCOUNTER — Other Ambulatory Visit: Payer: PPO

## 2017-08-11 DIAGNOSIS — M10072 Idiopathic gout, left ankle and foot: Secondary | ICD-10-CM | POA: Diagnosis not present

## 2017-08-11 DIAGNOSIS — M109 Gout, unspecified: Secondary | ICD-10-CM | POA: Insufficient documentation

## 2017-08-12 ENCOUNTER — Ambulatory Visit
Admission: RE | Admit: 2017-08-12 | Discharge: 2017-08-12 | Disposition: A | Discharge status: Home / Self Care | Payer: PPO | Source: Ambulatory Visit | Attending: Orthopedic Surgery | Admitting: Orthopedic Surgery

## 2017-08-12 DIAGNOSIS — X58XXXA Exposure to other specified factors, initial encounter: Secondary | ICD-10-CM | POA: Diagnosis not present

## 2017-08-12 DIAGNOSIS — M7672 Peroneal tendinitis, left leg: Secondary | ICD-10-CM | POA: Diagnosis not present

## 2017-08-12 DIAGNOSIS — S86022A Laceration of left Achilles tendon, initial encounter: Secondary | ICD-10-CM | POA: Insufficient documentation

## 2017-08-12 DIAGNOSIS — S86012A Strain of left Achilles tendon, initial encounter: Secondary | ICD-10-CM | POA: Diagnosis not present

## 2017-08-15 ENCOUNTER — Other Ambulatory Visit: Payer: Self-pay | Admitting: Nephrology

## 2017-08-16 ENCOUNTER — Encounter
Admission: RE | Admit: 2017-08-16 | Discharge: 2017-08-16 | Disposition: A | Discharge status: Home / Self Care | Payer: PPO | Source: Ambulatory Visit | Attending: Nephrology | Admitting: Nephrology

## 2017-08-16 ENCOUNTER — Ambulatory Visit: Payer: PPO

## 2017-08-16 DIAGNOSIS — M66862 Spontaneous rupture of other tendons, left lower leg: Secondary | ICD-10-CM | POA: Diagnosis not present

## 2017-08-16 DIAGNOSIS — S86019A Strain of unspecified Achilles tendon, initial encounter: Secondary | ICD-10-CM | POA: Insufficient documentation

## 2017-08-16 MED ORDER — TECHNETIUM TC 99M SESTAMIBI - CARDIOLITE
25.0000 | Freq: Once | INTRAVENOUS | Status: AC | PRN
  Administered 2017-08-16: 25 via INTRAVENOUS

## 2017-08-29 ENCOUNTER — Ambulatory Visit: Payer: PPO | Admitting: Neurology

## 2017-08-31 DIAGNOSIS — M66862 Spontaneous rupture of other tendons, left lower leg: Secondary | ICD-10-CM | POA: Diagnosis not present

## 2017-09-01 ENCOUNTER — Telehealth: Payer: Self-pay | Admitting: Neurology

## 2017-09-01 NOTE — Telephone Encounter (Signed)
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 She is aware Dr Erlinda Hong is not in the clinic and that the clinic closes at noon today, therefore it could be next week before RN returns the call

## 2017-09-04 NOTE — Telephone Encounter (Signed)
Message sent to Dr.Xu. PTs wife wants to know about progressive super nuclear palsy. Please advise thanks

## 2017-09-05 ENCOUNTER — Emergency Department
Admission: EM | Admit: 2017-09-05 | Discharge: 2017-09-05 | Disposition: A | Discharge status: Home / Self Care | Payer: PPO | Attending: Emergency Medicine | Admitting: Emergency Medicine

## 2017-09-05 DIAGNOSIS — N183 Chronic kidney disease, stage 3 (moderate): Secondary | ICD-10-CM | POA: Insufficient documentation

## 2017-09-05 DIAGNOSIS — M25571 Pain in right ankle and joints of right foot: Secondary | ICD-10-CM | POA: Diagnosis not present

## 2017-09-05 DIAGNOSIS — Z7982 Long term (current) use of aspirin: Secondary | ICD-10-CM | POA: Insufficient documentation

## 2017-09-05 DIAGNOSIS — R262 Difficulty in walking, not elsewhere classified: Secondary | ICD-10-CM

## 2017-09-05 DIAGNOSIS — Z79899 Other long term (current) drug therapy: Secondary | ICD-10-CM | POA: Diagnosis not present

## 2017-09-05 DIAGNOSIS — Z781 Physical restraint status: Secondary | ICD-10-CM | POA: Diagnosis not present

## 2017-09-05 DIAGNOSIS — I129 Hypertensive chronic kidney disease with stage 1 through stage 4 chronic kidney disease, or unspecified chronic kidney disease: Secondary | ICD-10-CM | POA: Diagnosis not present

## 2017-09-05 DIAGNOSIS — Z8673 Personal history of transient ischemic attack (TIA), and cerebral infarction without residual deficits: Secondary | ICD-10-CM | POA: Insufficient documentation

## 2017-09-05 DIAGNOSIS — M7672 Peroneal tendinitis, left leg: Secondary | ICD-10-CM | POA: Diagnosis not present

## 2017-09-05 DIAGNOSIS — R0600 Dyspnea, unspecified: Secondary | ICD-10-CM | POA: Diagnosis not present

## 2017-09-05 DIAGNOSIS — R531 Weakness: Secondary | ICD-10-CM | POA: Diagnosis present

## 2017-09-05 LAB — URINALYSIS, COMPLETE (UACMP) WITH MICROSCOPIC
Bacteria, UA: NONE SEEN
Bilirubin Urine: NEGATIVE
GLUCOSE, UA: NEGATIVE mg/dL
HGB URINE DIPSTICK: NEGATIVE
KETONES UR: NEGATIVE mg/dL
Leukocytes, UA: NEGATIVE
NITRITE: NEGATIVE
PH: 5 (ref 5.0–8.0)
Protein, ur: NEGATIVE mg/dL
Specific Gravity, Urine: 1.018 (ref 1.005–1.030)
Squamous Epithelial / HPF: NONE SEEN
WBC, UA: NONE SEEN WBC/hpf (ref 0–5)

## 2017-09-05 NOTE — Evaluation (Signed)
Physical Therapy Evaluation Patient Details Name: Brendan Nguyen MRN: [id number] DOB: [birth year] Today's Date: 09/05/2017   History of Present Illness  Pt admitted for difficulty ambulating at home. HIstory includes achilles tendon repair 6 weeks ago and has been NWB since then. Other history includes GERD, HTN, and TIA.  Clinical Impression  Pt is a pleasant 76 year old male who was admitted for difficulty walking at home. Pt performs bed mobility with max assist, however unable to maintain sitting at EOB secondary to fatigue and dependent leg pain. Further mobility deferred.  Pt demonstrates deficits with strength/mobility/balance. Would benefit from skilled PT to address above deficits and promote optimal return to PLOF; recommend transition to STR upon discharge from acute hospitalization.       Follow Up Recommendations SNF    Equipment Recommendations  None recommended by PT    Recommendations for Other Services       Precautions / Restrictions Precautions Precautions: Fall Restrictions Weight Bearing Restrictions: Yes LLE Weight Bearing: Non weight bearing      Mobility  Bed Mobility Overal bed mobility: Needs Assistance Bed Mobility: Supine to Sit     Supine to sit: Max assist     General bed mobility comments: assist for attempting to sit at EOB, pt able to achieve approx 80% of transfer before collapsing back on bed secondary to pain and fatigue. Unable to further perform mobility at this time  Transfers                 General transfer comment: unable to perform at this time  Ambulation/Gait                Stairs            Wheelchair Mobility    Modified Rankin (Stroke Patients Only)       Balance Overall balance assessment: History of Falls                                           Pertinent Vitals/Pain Pain Assessment: 0-10 Pain Score: 5  Pain Location: L foot Pain Descriptors / Indicators: Aching Pain  Intervention(s): Limited activity within patient's tolerance    Home Living Family/patient expects to be discharged to:: Private residence Living Arrangements: Spouse/significant other Available Help at Discharge: Family Type of Home: House Home Access: Stairs to enter Entrance Stairs-Rails: Right Entrance Stairs-Number of Steps: 3 Home Layout: One level Home Equipment: Environmental consultant - 2 wheels;Cane - quad;Cane - single point;Bedside commode;Transport chair      Prior Function Level of Independence: Independent         Comments: was working and walking in walking boot, however since cast changed and WBing restrictions in place, unable to perform ADLs.     Hand Dominance        Extremity/Trunk Assessment   Upper Extremity Assessment Upper Extremity Assessment: Generalized weakness (B UE grossly 3+/5)    Lower Extremity Assessment Lower Extremity Assessment: Generalized weakness (L LE grossly 2/5; R LE grossly 3/5)       Communication   Communication: No difficulties  Cognition Arousal/Alertness: Awake/alert Behavior During Therapy: WFL for tasks assessed/performed Overall Cognitive Status: Within Functional Limits for tasks assessed  General Comments      Exercises     Assessment/Plan    PT Assessment Patient needs continued PT services  PT Problem List Decreased strength;Decreased activity tolerance;Decreased balance;Decreased mobility;Decreased safety awareness;Pain       PT Treatment Interventions Gait training;Therapeutic activities;Therapeutic exercise;Balance training    PT Goals (Current goals can be found in the Care Plan section)  Acute Rehab PT Goals Patient Stated Goal: to be able to walk PT Goal Formulation: With patient Time For Goal Achievement: 09/19/17 Potential to Achieve Goals: Good    Frequency Min 2X/week   Barriers to discharge Inaccessible home environment;Decreased caregiver  support      Co-evaluation               AM-PAC PT "6 Clicks" Daily Activity  Outcome Measure Difficulty turning over in bed (including adjusting bedclothes, sheets and blankets)?: Unable Difficulty moving from lying on back to sitting on the side of the bed? : Unable Difficulty sitting down on and standing up from a chair with arms (e.g., wheelchair, bedside commode, etc,.)?: Unable Help needed moving to and from a bed to chair (including a wheelchair)?: Total Help needed walking in hospital room?: Total Help needed climbing 3-5 steps with a railing? : Total 6 Click Score: 6    End of Session   Activity Tolerance: Patient limited by pain Patient left: in bed Nurse Communication: Mobility status PT Visit Diagnosis: Muscle weakness (generalized) (M62.81);Repeated falls (R29.6);Difficulty in walking, not elsewhere classified (R26.2);Pain Pain - Right/Left: Left Pain - part of body: Ankle and joints of foot    Time: 0315-9458 PT Time Calculation (min) (ACUTE ONLY): 25 min   Charges:   PT Evaluation $PT Eval Moderate Complexity: 1 Mod     PT G Codes:   PT G-Codes **NOT FOR INPATIENT CLASS** Functional Assessment Tool Used: AM-PAC 6 Clicks Basic Mobility Functional Limitation: Mobility: Walking and moving around Mobility: Walking and Moving Around Current Status (P9292): 100 percent impaired, limited or restricted Mobility: Walking and Moving Around Goal Status (K4628): At least 80 percent but less than 100 percent impaired, limited or restricted    Brendan Nguyen, PT, DPT [phone]   Brendan Nguyen 09/05/2017, 5:18 PM

## 2017-09-05 NOTE — Discharge Instructions (Signed)
Return to the ER for any new or worsening symptoms that concern you.  Follow-up with your primary care doctor and as instructed by the social worker.

## 2017-09-05 NOTE — ED Notes (Signed)
Called patient's spouse and notified her that her husband is on his way home via ambulance

## 2017-09-05 NOTE — ED Provider Notes (Signed)
[facility] Emergency Department Provider Note   ____________________________________________    I have reviewed the triage vital signs and the nursing notes.   HISTORY  Chief Complaint Weakness     HPI Brendan Nguyen is a 76 y.o. male Who presents with complaints of difficulty ambulating. Patient has been working with neurology over the last year to evaluate a unsteady gait. Recently he also suffered an Achilles tendon rupture which has made it more difficult for him to get around although he apparently has been able to with a walkerbut this is becoming harder and harder. Apparently his wife called his insurance company and they recommended he come to the emergency department to be evaluated for rehabilitation placement   Past Medical History:  Diagnosis Date  . BPH (benign prostatic hyperplasia)   . Chronic kidney disease   . Elevated blood uric acid level   . GERD (gastroesophageal reflux disease)   . Hyperparathyroidism ([place])   . Hypertension   . TIA (transient ischemic attack) 2013    Patient Active Problem List   Diagnosis Date Noted  . Abnormal gait 06/21/2017  . Mixed hyperlipidemia 03/30/2017  . Stage 3 chronic kidney disease 03/30/2017  . Polyneuropathy 03/29/2017  . Dizzy spells 03/29/2017  . HBP (high blood pressure) 11/16/2015    Past Surgical History:  Procedure Laterality Date  . ACHILLES TENDON REPAIR    . L5 lamenectomy     had surgery but not the complete surgery  . l5-s1 laninectomy    . left inguinal hernia      Prior to Admission medications   Medication Sig Start Date End Date Taking? Authorizing Provider  aspirin EC 81 MG tablet Take 162 mg by mouth daily.    Yes [provider]  Cholecalciferol (VITAMIN D-1000 MAX ST) 1000 units tablet Take 1,000 Units by mouth daily.    Yes [provider]  fenofibrate 160 MG tablet Take 160 mg by mouth daily.  03/09/17  Yes [provider]    Flaxseed, Linseed, (FLAXSEED OIL PO) Take 1,000 mg by mouth daily.    Yes [provider]  OVER THE COUNTER MEDICATION    Yes [provider]  OVER THE COUNTER MEDICATION    Yes [provider]  traMADol (ULTRAM) 50 MG tablet Take 50 mg by mouth every 6 (six) hours as needed for pain.   Yes [provider]  pyridostigmine (MESTINON) 60 MG tablet 30mg  tid for 5 days and then 60mg  tid Patient not taking: Reported on 09/05/2017 06/21/17   Rosalin Hawking, MD     Allergies Patient has no known allergies.  No family history on file.  Social History Social History  Substance Use Topics  . Smoking status: Never Smoker  . Smokeless tobacco: Never Used  . Alcohol use No    Review of Systems  Constitutional: No fever/chills Eyes: No visual changes.  ENT: No neck pain Cardiovascular: Denies chest pain. Respiratory: Denies shortness of breath. Gastrointestinal: No abdominal pain.  No nausea, no vomiting.   Genitourinary: Negative for dysuria. Musculoskeletal: leg pain Skin: Negative for rash. Neurological: Negative for headaches or new Weakness   ____________________________________________   PHYSICAL EXAM:  VITAL SIGNS: ED Triage Vitals  Enc Vitals Group     BP 09/05/17 1339 128/80     Pulse Rate 09/05/17 1339 88     Resp 09/05/17 1339 18     Temp 09/05/17 1339 97.9 F (36.6 C)     Temp  Source 09/05/17 1339 Oral     SpO2 09/05/17 1339 93 %     Weight 09/05/17 1339 90.7 kg (200 lb)     Height 09/05/17 1339 1.702 m (5\' 7" )     Head Circumference --      Peak Flow --      Pain Score 09/05/17 1338 5     Pain Loc --      Pain Edu? --      Excl. in [place]? --     Constitutional: Alert and oriented. No acute distress. Pleasant and interactive Eyes: Conjunctivae are normal.   Nose: No congestion/rhinnorhea. Mouth/Throat: Mucous membranes are moist.    Cardiovascular: Normal rate, regular rhythm. Grossly normal heart sounds.  Good peripheral  circulation. Respiratory: Normal respiratory effort.  No retractions. Lungs CTAB. Gastrointestinal: Soft and nontender. No distention.  No CVA tenderness. Genitourinary: deferred Musculoskeletal:  Warm and well perfused, cast left lower leg Neurologic:  Normal speech and language. No gross focal neurologic deficits are appreciated.  Skin:  Skin is warm, dry and intact. No rash noted. Psychiatric: Mood and affect are normal. Speech and behavior are normal.  ____________________________________________   LABS (all labs ordered are listed, but only abnormal results are displayed)  Labs Reviewed - No data to display ____________________________________________  EKG  None ____________________________________________  RADIOLOGY  None ____________________________________________   PROCEDURES  Procedure(s) performed: No    Critical Care performed: No ____________________________________________   INITIAL IMPRESSION / ASSESSMENT AND PLAN / ED COURSE  Pertinent labs & imaging results that were available during my care of the patient were reviewed by me and considered in my medical decision making (see chart for details).   To the patient that this is an unusual reason for presentation to the emergency department. I have consulted social work and physical therapy to see if we can arrange for rehabilitation but warned the patient that this is unlikely and discharge home may be necessary as no indication for admission.    ____________________________________________   FINAL CLINICAL IMPRESSION(S) / ED DIAGNOSES  Final diagnoses:  Ambulatory dysfunction      NEW MEDICATIONS STARTED DURING THIS VISIT:  New Prescriptions   No medications on file     Note:  This document was prepared using Dragon voice recognition software and may include unintentional dictation errors.    [place] Drafts, MD 09/05/17 [phone]

## 2017-09-05 NOTE — Clinical Social Work Note (Addendum)
CSW met with pt and wife-Carol Bogosian several times today to address Social Work consult for "rehab placement." Pt and wife stated pt unable to ambulate and wife expressed difficulty in assisting pt with ADLs in home. Pt and wife requesting [facility] (SNF) placement for short term rehab (STR) and want [facility]. P/T recommending SNF placement. CSW informed by Sharyn Lull at [facility] no beds available at this time. Pt agreeable to county fax out. CSW faxed out FL-2 through [place].   CSW spoke with Broadus John at [facility] [employer] [phone]) and Colletta Maryland at [facility] [phone]) who both stated pt has a liability claim through [employer] from 08/21/2011 that needs to be terminated before pt can admit to facility. CSW also called and left a voicemail for Frederic Jericho [phone]) at [facility] and Ricci Barker [phone]) at [facility]. CSW updated pt and wife and provided print out sent by Broadus John of claim and the claim recover department number [phone]) also provided by Broadus John. Wife informed CSW that she was unable to resolve insurance issue today and unsure as to how long it will be before resolved. Pt and wife state they are unable to pay privately. Wife very frustrated. CSW empathized with pt and wife and validated their feelings.   CSW spoke with Janey Genta [phone]) at [facility] to ask if this liability insurance issue would affect pt getting auth for STR. Ms. Jefferey Pica stated she would have to check with upper management, as she is not sure. CSW received a call from Elayne Guerin from [facility] stating confusion as to why pt not ale to discharge to SNF, as HTA can approve for STR. CSW provided number to [employer] for Broadus John and [place] to speak with Erskin Burnet for Ms. Trueder to verify information CSW is explaining.    CSW updated RNCM Malachy Mood about possible need for home health services. Pt and wife agreeable to  return home today. Pt in good spirits. Wife very appreciative of CSW support. CSW updated MD and RN. CSW completed med necessity form and informed ED Secretary to arrange ambulance for pt discharge home. CSW signing off as no further Social Work needs identified.    Brendan Nguyen, Brendan Nguyen, [place] Social Worker-ED [phone]

## 2017-09-05 NOTE — ED Notes (Signed)
PT is in with the patient at present.

## 2017-09-05 NOTE — NC FL2 (Signed)
  [place] LEVEL OF CARE SCREENING TOOL     IDENTIFICATION  Patient Name: FLORENCIO HOLLIBAUGH Birthdate: 05/02/1941 Sex: male Admission Date (Current Location): 09/05/2017  [place] and [place] Number:  Engineering geologist and Address:  [facility], [address] [place], [place] [address]      Provider Number: [id number]  Attending Physician Name and Address:  Lavonia Drafts, MD  Relative Name and Phone Number:  Spouse Callaghan Laverdure [phone]    Current Level of Care: Hospital Recommended Level of Care: [place] Prior Approval Number:    Date Approved/Denied:   PASRR Number: [id number]  Discharge Plan: SNF    Current Diagnoses: Patient Active Problem List   Diagnosis Date Noted  . Abnormal gait 06/21/2017  . Mixed hyperlipidemia 03/30/2017  . Stage 3 chronic kidney disease 03/30/2017  . Polyneuropathy 03/29/2017  . Dizzy spells 03/29/2017  . HBP (high blood pressure) 11/16/2015    Orientation RESPIRATION BLADDER Height & Weight     Self, Time, Situation, Place  Normal Continent Weight: 200 lb (90.7 kg) Height:  5\' 7"  (170.2 cm)  BEHAVIORAL SYMPTOMS/MOOD NEUROLOGICAL BOWEL NUTRITION STATUS      Continent Diet (Regular diet)  AMBULATORY STATUS COMMUNICATION OF NEEDS Skin   Extensive Assist Verbally                         Personal Care Assistance Level of Assistance  Bathing, Feeding, Dressing Bathing Assistance: Limited assistance Feeding assistance: Independent Dressing Assistance: Limited assistance     Functional Limitations Info  Sight, Hearing, Speech Sight Info: Adequate Hearing Info: Adequate Speech Info: Adequate    SPECIAL CARE FACTORS FREQUENCY  PT (By licensed PT), OT (By licensed OT)     PT Frequency: 5x OT Frequency: 5x            Contractures Contractures Info: Not present    Additional Factors Info  Code Status, Allergies Code Status Info: Full Allergies Info: No  known allergies           Current Medications (09/05/2017):  This is the current hospital active medication list No current facility-administered medications for this encounter.    Current Outpatient Prescriptions  Medication Sig Dispense Refill  . aspirin EC 81 MG tablet Take 162 mg by mouth daily.     . Cholecalciferol (VITAMIN D-1000 MAX ST) 1000 units tablet Take 1,000 Units by mouth daily.     . fenofibrate 160 MG tablet Take 160 mg by mouth daily.     . Flaxseed, Linseed, (FLAXSEED OIL PO) Take 1,000 mg by mouth daily.     Marland Kitchen OVER THE COUNTER MEDICATION     . OVER THE COUNTER MEDICATION     . traMADol (ULTRAM) 50 MG tablet Take 50 mg by mouth every 6 (six) hours as needed for pain.    Marland Kitchen pyridostigmine (MESTINON) 60 MG tablet 30mg  tid for 5 days and then 60mg  tid (Patient not taking: Reported on 09/05/2017) 90 tablet 2     Discharge Medications: Please see discharge summary for a list of discharge medications.  Relevant Imaging Results:  Relevant Lab Results:   Additional Information SSN: [id number]  Truitt Merle, LCSW

## 2017-09-05 NOTE — ED Provider Notes (Signed)
-----------------------------------------   5:32 PM on 09/05/2017 -----------------------------------------  Took over this patient's care from Dr. Sela Hilding. Patient was planned for likely discharge home; at that time and was pending Education officer, museum and PE evaluation. These have been completed and per social work evaluation, patient will need placement from home.  Patient and family member understand the plan and all questions have been answered. Return precautions have been given. Patient safe for discharge home at this time.   Arta Silence, MD 09/05/17 [phone]

## 2017-09-05 NOTE — ED Triage Notes (Signed)
Pt states he had achilles tendon repair 6 weeks ago and has had increased weakness and issues with mobility since.Brendan Nguyen Spoke with orthopedist today and was referred to ED for possible placement at [facility].Brendan Nguyen

## 2017-09-05 NOTE — ED Notes (Signed)
CSW at the bedside speaking with pt and family.Marland Kitchen

## 2017-09-06 NOTE — Care Management Note (Signed)
Case Management Note  Patient Details  Name: Brendan Nguyen MRN: [id number] Date of Birth: 1941-07-05  Subjective/Objective:     Spoke to spouse Satoru Milich today,about the possibility of [facility] PT and her visit here in the ER. The souse is upset that the patient had been told to walk previously, and sustained a torn achilles tendon due to the instructions. She is also upset that [employer] has been denying the claims due to "invalid diagnosis codes" being assigned to the OP visits the patient had. I have received permission from her to talk to Melvin at [employer] about whether or not [facility] PT is an option until the pt. Can go to a rehab facility. She does state that someone is talking to Dr Sabra Heck at [facility] about [facility], so we have agreed that I will call her tomorrow and see if the [facility] issue has been resolved. She is very pleasant , and understandably frustrated with the situation.   She has voiced her appreciation for the call in follow up.           Action/Plan:   Expected Discharge Date:                  Expected Discharge Plan:     In-House Referral:     Discharge planning Services     Post Acute Care Choice:    Choice offered to:     DME Arranged:    DME Agency:     HH Arranged:    HH Agency:     Status of Service:     If discussed at [employer] of Stay Meetings, dates discussed:    Additional Comments:  Beau Fanny, RN 09/06/2017, 12:08 PM
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
## 2017-09-07 NOTE — Patient Outreach (Signed)
[facility]) Care Management  09/07/2017  Brendan Nguyen Apr 24, 1941 [id number]  TELEPHONE SCREENING Referral date: 09/06/17 Referral source: primary MD  Referral reason: patient needs assistance with community resources and / or placement options Insurance: Health team advantage  Telephone call to patient regarding primary MD referral.    PLAN:

## 2017-09-08 ENCOUNTER — Other Ambulatory Visit: Payer: Self-pay | Admitting: *Deleted

## 2017-09-08 NOTE — Telephone Encounter (Signed)
This encounter was created in error - please disregard.

## 2017-09-08 NOTE — Patient Outreach (Signed)
[facility]) Care Management  09/08/2017  Brendan Nguyen 11-21-1941 [id number]  Telephone Screen  Referral Date: 09/06/17 Referral Source: [employer]TA Referral Reason: Assistance with community resources and/or placement options. Insurance: [employer]TA   Outreach phone call to patient. HIPAA identifiers verified with spouse. Spouse contacted MD's office to request assistance for patient. Spouse stated, patient is beginning to become more debilitated with mobility. Patient is non-weight bearing to his left leg due to a torn Achilles tendon, per spouse. Patient has peripheral neuropathy in his right leg. Spouse stated, patient can move from bed to [facility], from [facility] to chair, and from chair to bed with 2 person assist. Spouse explained, patient situation is getting more difficult to manage due to lack of assistance. Friends were assisting patient with his medical needs, however his friends will be moving away. Spouse stated, she will not be able to provide the necessary care for patient alone. Spouse is requesting assistance as soon as possible. Spouse is requesting personal care services, therapy, and/or possible rehab.   Plan: RN CM will send [facility] SW referral for possible assistance with community resources related to therapy and personal care services or rehab. RN CM advised patient to contact RNCM for any needs or concerns. RN CM advised patient to alert MD for any changes in conditions.    Lake Bells, RN, BSN, MHA/MSL, [place] Telephonic Care Manager Coordinator [facility] Direct Phone: [phone] Toll Free: [phone] Fax: [phone]

## 2017-09-11 ENCOUNTER — Other Ambulatory Visit: Payer: Self-pay | Admitting: *Deleted

## 2017-09-11 DIAGNOSIS — I129 Hypertensive chronic kidney disease with stage 1 through stage 4 chronic kidney disease, or unspecified chronic kidney disease: Secondary | ICD-10-CM | POA: Diagnosis not present

## 2017-09-11 DIAGNOSIS — Z6833 Body mass index (BMI) 33.0-33.9, adult: Secondary | ICD-10-CM | POA: Diagnosis not present

## 2017-09-11 DIAGNOSIS — M66872 Spontaneous rupture of other tendons, left ankle and foot: Secondary | ICD-10-CM | POA: Diagnosis not present

## 2017-09-11 DIAGNOSIS — N400 Enlarged prostate without lower urinary tract symptoms: Secondary | ICD-10-CM | POA: Diagnosis not present

## 2017-09-11 DIAGNOSIS — Z79891 Long term (current) use of opiate analgesic: Secondary | ICD-10-CM | POA: Diagnosis not present

## 2017-09-11 DIAGNOSIS — K219 Gastro-esophageal reflux disease without esophagitis: Secondary | ICD-10-CM | POA: Diagnosis not present

## 2017-09-11 DIAGNOSIS — N183 Chronic kidney disease, stage 3 (moderate): Secondary | ICD-10-CM | POA: Diagnosis not present

## 2017-09-11 DIAGNOSIS — E669 Obesity, unspecified: Secondary | ICD-10-CM | POA: Diagnosis not present

## 2017-09-11 DIAGNOSIS — Z7982 Long term (current) use of aspirin: Secondary | ICD-10-CM | POA: Diagnosis not present

## 2017-09-11 NOTE — Patient Outreach (Signed)
Request received from [facility], LCSW to mail patient personal care resources.  Information mailed today.

## 2017-09-11 NOTE — Patient Outreach (Signed)
[facility]) Care Management  09/11/2017  Brendan Nguyen [birth year] [id number]   Phone call to patient's spouse at the request of [facility] RNCM Brendan Nguyen to assist with community resources with possible need for [facility] or inpatient rehab as well as personal care services.  This Education officer, museum spoke with patient's spouse who states the the physical therapist is schedule to arrive today at 10:30am. PT arrived during this  phone call and it was confirmed that  [place] will be providing [facility] services for patient.  PT is Brendan Nguyen.  Patient's spouse also stated that patient is doing much better with his transferring and they are no longer in need of in hone care. Per patient's spouse, she is receiving assistance from her friends, however they will be unavailable for approximately 1 month. Her son, however remains available. This social worker explained personal care services and that they are an out of pocket expense. This Education officer, museum offered to mail her a list of agencies that she could choose from if needed in the future. However, she feels much better that his mobility has increased and he is able to bear some weight on his leg.  Patient's spouse verbalized no additional community resources needs. This Education officer, museum request for the case management assistant to mail patient and his spouse the agency list for personal care services for future use.   Brendan Nguyen [facility] Care Management [phone]

## 2017-09-14 DIAGNOSIS — M66862 Spontaneous rupture of other tendons, left lower leg: Secondary | ICD-10-CM | POA: Diagnosis not present

## 2017-09-15 DIAGNOSIS — M66872 Spontaneous rupture of other tendons, left ankle and foot: Secondary | ICD-10-CM | POA: Diagnosis not present

## 2017-09-15 DIAGNOSIS — N183 Chronic kidney disease, stage 3 (moderate): Secondary | ICD-10-CM | POA: Diagnosis not present

## 2017-09-15 DIAGNOSIS — N400 Enlarged prostate without lower urinary tract symptoms: Secondary | ICD-10-CM | POA: Diagnosis not present

## 2017-09-15 DIAGNOSIS — Z79891 Long term (current) use of opiate analgesic: Secondary | ICD-10-CM | POA: Diagnosis not present

## 2017-09-15 DIAGNOSIS — I129 Hypertensive chronic kidney disease with stage 1 through stage 4 chronic kidney disease, or unspecified chronic kidney disease: Secondary | ICD-10-CM | POA: Diagnosis not present

## 2017-09-15 DIAGNOSIS — K219 Gastro-esophageal reflux disease without esophagitis: Secondary | ICD-10-CM | POA: Diagnosis not present

## 2017-09-15 DIAGNOSIS — Z7982 Long term (current) use of aspirin: Secondary | ICD-10-CM | POA: Diagnosis not present

## 2017-09-15 DIAGNOSIS — E669 Obesity, unspecified: Secondary | ICD-10-CM | POA: Diagnosis not present

## 2017-09-15 DIAGNOSIS — Z6833 Body mass index (BMI) 33.0-33.9, adult: Secondary | ICD-10-CM | POA: Diagnosis not present

## 2017-09-25 DIAGNOSIS — N183 Chronic kidney disease, stage 3 (moderate): Secondary | ICD-10-CM | POA: Diagnosis not present

## 2017-09-25 DIAGNOSIS — N400 Enlarged prostate without lower urinary tract symptoms: Secondary | ICD-10-CM | POA: Diagnosis not present

## 2017-09-25 DIAGNOSIS — M66872 Spontaneous rupture of other tendons, left ankle and foot: Secondary | ICD-10-CM | POA: Diagnosis not present

## 2017-09-25 DIAGNOSIS — I129 Hypertensive chronic kidney disease with stage 1 through stage 4 chronic kidney disease, or unspecified chronic kidney disease: Secondary | ICD-10-CM | POA: Diagnosis not present

## 2017-09-25 DIAGNOSIS — Z79891 Long term (current) use of opiate analgesic: Secondary | ICD-10-CM | POA: Diagnosis not present

## 2017-09-25 DIAGNOSIS — E669 Obesity, unspecified: Secondary | ICD-10-CM | POA: Diagnosis not present

## 2017-09-25 DIAGNOSIS — Z7982 Long term (current) use of aspirin: Secondary | ICD-10-CM | POA: Diagnosis not present

## 2017-09-25 DIAGNOSIS — Z6833 Body mass index (BMI) 33.0-33.9, adult: Secondary | ICD-10-CM | POA: Diagnosis not present

## 2017-09-25 DIAGNOSIS — K219 Gastro-esophageal reflux disease without esophagitis: Secondary | ICD-10-CM | POA: Diagnosis not present

## 2017-09-27 DIAGNOSIS — M66862 Spontaneous rupture of other tendons, left lower leg: Secondary | ICD-10-CM | POA: Diagnosis not present

## 2017-10-13 DIAGNOSIS — E669 Obesity, unspecified: Secondary | ICD-10-CM | POA: Diagnosis not present

## 2017-10-13 DIAGNOSIS — K219 Gastro-esophageal reflux disease without esophagitis: Secondary | ICD-10-CM | POA: Diagnosis not present

## 2017-10-13 DIAGNOSIS — I129 Hypertensive chronic kidney disease with stage 1 through stage 4 chronic kidney disease, or unspecified chronic kidney disease: Secondary | ICD-10-CM | POA: Diagnosis not present

## 2017-10-13 DIAGNOSIS — Z6833 Body mass index (BMI) 33.0-33.9, adult: Secondary | ICD-10-CM | POA: Diagnosis not present

## 2017-10-13 DIAGNOSIS — Z79891 Long term (current) use of opiate analgesic: Secondary | ICD-10-CM | POA: Diagnosis not present

## 2017-10-13 DIAGNOSIS — Z7982 Long term (current) use of aspirin: Secondary | ICD-10-CM | POA: Diagnosis not present

## 2017-10-13 DIAGNOSIS — M66872 Spontaneous rupture of other tendons, left ankle and foot: Secondary | ICD-10-CM | POA: Diagnosis not present

## 2017-10-13 DIAGNOSIS — N400 Enlarged prostate without lower urinary tract symptoms: Secondary | ICD-10-CM | POA: Diagnosis not present

## 2017-10-13 DIAGNOSIS — N183 Chronic kidney disease, stage 3 (moderate): Secondary | ICD-10-CM | POA: Diagnosis not present

## 2017-10-18 DIAGNOSIS — M7672 Peroneal tendinitis, left leg: Secondary | ICD-10-CM | POA: Diagnosis not present

## 2017-10-18 DIAGNOSIS — M10072 Idiopathic gout, left ankle and foot: Secondary | ICD-10-CM | POA: Diagnosis not present

## 2017-10-18 DIAGNOSIS — M66862 Spontaneous rupture of other tendons, left lower leg: Secondary | ICD-10-CM | POA: Diagnosis not present

## 2017-10-23 DIAGNOSIS — N183 Chronic kidney disease, stage 3 (moderate): Secondary | ICD-10-CM | POA: Diagnosis not present

## 2017-10-23 DIAGNOSIS — R600 Localized edema: Secondary | ICD-10-CM | POA: Diagnosis not present

## 2017-10-23 DIAGNOSIS — I10 Essential (primary) hypertension: Secondary | ICD-10-CM | POA: Diagnosis not present

## 2017-10-23 DIAGNOSIS — N179 Acute kidney failure, unspecified: Secondary | ICD-10-CM | POA: Diagnosis not present

## 2017-10-24 DIAGNOSIS — Z9181 History of falling: Secondary | ICD-10-CM | POA: Diagnosis not present

## 2017-10-24 DIAGNOSIS — R2681 Unsteadiness on feet: Secondary | ICD-10-CM | POA: Diagnosis not present

## 2017-10-24 DIAGNOSIS — G20 Parkinson's disease: Secondary | ICD-10-CM | POA: Diagnosis not present

## 2017-10-24 DIAGNOSIS — N179 Acute kidney failure, unspecified: Secondary | ICD-10-CM | POA: Diagnosis not present

## 2017-10-24 DIAGNOSIS — M6281 Muscle weakness (generalized): Secondary | ICD-10-CM | POA: Diagnosis not present

## 2017-10-26 DIAGNOSIS — R2689 Other abnormalities of gait and mobility: Secondary | ICD-10-CM | POA: Diagnosis not present

## 2017-10-26 DIAGNOSIS — G20 Parkinson's disease: Secondary | ICD-10-CM | POA: Diagnosis not present

## 2017-10-26 DIAGNOSIS — M6281 Muscle weakness (generalized): Secondary | ICD-10-CM | POA: Diagnosis not present

## 2017-11-10 DIAGNOSIS — G20 Parkinson's disease: Secondary | ICD-10-CM | POA: Diagnosis not present

## 2017-11-10 DIAGNOSIS — R2689 Other abnormalities of gait and mobility: Secondary | ICD-10-CM | POA: Diagnosis not present

## 2017-11-10 DIAGNOSIS — M6281 Muscle weakness (generalized): Secondary | ICD-10-CM | POA: Diagnosis not present

## 2017-11-13 DIAGNOSIS — M6281 Muscle weakness (generalized): Secondary | ICD-10-CM | POA: Diagnosis not present

## 2017-11-13 DIAGNOSIS — R2689 Other abnormalities of gait and mobility: Secondary | ICD-10-CM | POA: Diagnosis not present

## 2017-11-13 DIAGNOSIS — G20 Parkinson's disease: Secondary | ICD-10-CM | POA: Diagnosis not present

## 2017-11-15 DIAGNOSIS — R2689 Other abnormalities of gait and mobility: Secondary | ICD-10-CM | POA: Diagnosis not present

## 2017-11-15 DIAGNOSIS — M6281 Muscle weakness (generalized): Secondary | ICD-10-CM | POA: Diagnosis not present

## 2017-11-15 DIAGNOSIS — G20 Parkinson's disease: Secondary | ICD-10-CM | POA: Diagnosis not present

## 2017-11-22 ENCOUNTER — Telehealth: Payer: Self-pay | Admitting: Neurology

## 2017-11-22 NOTE — Telephone Encounter (Signed)
Called Brendan Nguyen and his wife. They stated that Brendan Nguyen's Achille tendon rupture has healed and he is able to bear weight with the legs. However, for the last few month, his legs progressively getting worse. He was able to hold onto things on walking for 2-3 hours before the legs give out. However, over time, his legs gradually getting worse, and now even right after getting out of bed in the morning, his legs are too weak to walk. He is worried.   He has not have MRI C/T spine done yet. His EMG with [place] only showed minimal sensory neuropathy. He also followed with ortho Dr. Gilford Rile for Achille tendon rupture and had two EMGs, also inconclusive. I will send him to see our neuromuscular specialist Dr. Krista Blue tomorrow 10am for a second opinion. Brendan Nguyen and his wife expressed understanding and appreciation.   Rosalin Hawking, MD PhD Stroke Neurology 11/22/2017 5:45 PM

## 2017-11-22 NOTE — Telephone Encounter (Signed)
Pt's wife called, the pt has been taking PT at home and at [facility] but it is not helping. She is wanting to know what is the next step. Please see tele note from 09/06/17.

## 2017-11-22 NOTE — Telephone Encounter (Signed)
Hi, Yijun:  I have a interesting pt and would like your expert opinion. He is going to see you tomorrow 10am.   76 y.o. male with PMH of HTN, HLD, CKD had followed with Dr. Melrose Nakayama in [place] [facility] since 08/2016 for abnormal gait, frequent falls. 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 Has tried gabapentin, sinemet but not effective.   I saw him as new pt in 03/2017, found to have difficulty raise up from chair, broad based gait, small stride, unsteady, difficulty with turns. Also seems to be some better in am or after rest, and worse at pm. Had EMG x 2, showed minimal to mild sensory neuropathy with negative repetitive stimulation, AchR Ab negative, B12, B1, B6 normal, no response to mestinon. No spinal cord sign, but ordered MRI C-/T- spine. Not able to do due to difficult transportation after Achille tendon rupture in 07/2017.  Now his Achille tendon rupture has healed and he is able to bear weight with the legs. However, for the last few month, his legs progressively getting worse. He was able to hold onto things on walking for 2-3 hours before the legs give out. However, over time, his legs gradually getting worse, and now even right after getting out of bed in the morning, his legs are too weak to walk. He has not have MRI C/T spine done yet. Thank you for your help.  Addalyne Vandehei

## 2017-11-23 ENCOUNTER — Telehealth: Payer: Self-pay | Admitting: Neurology

## 2017-11-23 ENCOUNTER — Encounter: Payer: Self-pay | Admitting: Neurology

## 2017-11-23 ENCOUNTER — Ambulatory Visit (INDEPENDENT_AMBULATORY_CARE_PROVIDER_SITE_OTHER): Payer: PPO | Admitting: Neurology

## 2017-11-23 VITALS — BP 128/77 | HR 74 | Ht 67.0 in | Wt 204.5 lb

## 2017-11-23 DIAGNOSIS — R269 Unspecified abnormalities of gait and mobility: Secondary | ICD-10-CM
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
## 2017-11-23 NOTE — Telephone Encounter (Signed)
Noted  

## 2017-11-23 NOTE — Telephone Encounter (Signed)
Please schedule MRI of cervical and thoracic spine, order was placed.

## 2017-12-02 ENCOUNTER — Ambulatory Visit
Admission: RE | Admit: 2017-12-02 | Discharge: 2017-12-02 | Disposition: A | Discharge status: Home / Self Care | Payer: PPO | Source: Ambulatory Visit | Attending: Neurology | Admitting: Neurology

## 2017-12-02 DIAGNOSIS — R269 Unspecified abnormalities of gait and mobility: Secondary | ICD-10-CM

## 2017-12-02 DIAGNOSIS — M50222 Other cervical disc displacement at C5-C6 level: Secondary | ICD-10-CM | POA: Diagnosis not present

## 2017-12-02 DIAGNOSIS — M5124 Other intervertebral disc displacement, thoracic region: Secondary | ICD-10-CM | POA: Diagnosis not present

## 2017-12-02 DIAGNOSIS — M50223 Other cervical disc displacement at C6-C7 level: Secondary | ICD-10-CM | POA: Diagnosis not present

## 2017-12-02 MED ORDER — GADOBENATE DIMEGLUMINE 529 MG/ML IV SOLN
20.0000 mL | Freq: Once | INTRAVENOUS | Status: AC | PRN
  Administered 2017-12-02: 20 mL via INTRAVENOUS

## 2017-12-08 ENCOUNTER — Telehealth: Payer: Self-pay | Admitting: Neurology

## 2017-12-08 NOTE — Telephone Encounter (Signed)
Pt is wanting to discuss MRI results. Please call when information is ready. Pt is aware that we are closed 12/11/17 and 12/12/17 Pt is also aware that Dr Krista Blue wasn't in office today

## 2017-12-13 NOTE — Telephone Encounter (Signed)
Left message for patient. 1/2/20199:20 AM  mck

## 2017-12-13 NOTE — Telephone Encounter (Signed)
Spoke to patient's wife on DPR - she is aware of results and she has scheduled a follow up for further discussion.

## 2017-12-13 NOTE — Telephone Encounter (Signed)
Please call patient, MRI of the thoracic spine was normal, MRI of the cervical spine showed mild degenerative changes, most severe at C3-4, C4-5, C5-6, there is no evidence of canal stenosis, spinal cord signal change, there is evidence of mild bilateral foraminal narrowing,  Review MRI films with him at next follow-up visit   Normal appearing cervical and thoracic cord.  Central disc protrusion at C3-4 mildly indents the ventral cord. Moderately severe to severe foraminal narrowing at C3-4 is worse on the right.  Mild deformity of the ventral cord and severe bilateral foraminal narrowing at C4-5 due to a disc osteophyte complex and uncovertebral disease.  Moderate to moderately severe foraminal narrowing at C5-6 is worse on the left. The central canal is open.  Moderate to moderately severe foraminal narrowing at C6-7 is worse on the right. The central canal is open.  Mild thoracic spondylosis as detailed above without central canal narrowing.

## 2017-12-20 ENCOUNTER — Telehealth: Payer: Self-pay | Admitting: Neurology

## 2017-12-20 DIAGNOSIS — G3281 Cerebellar ataxia in diseases classified elsewhere: Secondary | ICD-10-CM

## 2017-12-20 NOTE — Telephone Encounter (Signed)
Please explain to patient, MRI brain, cervical and thoracic findings would not explains his progressive gait abnormalities.  I have ordered MRI lumbar, keep his appt in Feb 2019

## 2017-12-20 NOTE — Telephone Encounter (Signed)
Spoke to patient's wife - they are aware of results and agreeable to the lumbar MRI.  He has a pending appt w/ ENT to address his dizziness on 12/21/17.

## 2017-12-21 DIAGNOSIS — H6122 Impacted cerumen, left ear: Secondary | ICD-10-CM | POA: Diagnosis not present

## 2017-12-21 DIAGNOSIS — R42 Dizziness and giddiness: Secondary | ICD-10-CM | POA: Diagnosis not present

## 2017-12-29 DIAGNOSIS — R42 Dizziness and giddiness: Secondary | ICD-10-CM | POA: Diagnosis not present

## 2017-12-29 DIAGNOSIS — H903 Sensorineural hearing loss, bilateral: Secondary | ICD-10-CM | POA: Diagnosis not present

## 2017-12-29 DIAGNOSIS — E781 Pure hyperglyceridemia: Secondary | ICD-10-CM | POA: Diagnosis not present

## 2017-12-29 DIAGNOSIS — N183 Chronic kidney disease, stage 3 (moderate): Secondary | ICD-10-CM | POA: Diagnosis not present

## 2017-12-29 DIAGNOSIS — R7303 Prediabetes: Secondary | ICD-10-CM | POA: Diagnosis not present

## 2017-12-29 DIAGNOSIS — E559 Vitamin D deficiency, unspecified: Secondary | ICD-10-CM | POA: Diagnosis not present

## 2018-01-02 ENCOUNTER — Inpatient Hospital Stay: Admission: RE | Admit: 2018-01-02 | Payer: PPO | Source: Ambulatory Visit

## 2018-01-04 DIAGNOSIS — R42 Dizziness and giddiness: Secondary | ICD-10-CM | POA: Diagnosis not present

## 2018-01-05 DIAGNOSIS — E669 Obesity, unspecified: Secondary | ICD-10-CM | POA: Diagnosis not present

## 2018-01-05 DIAGNOSIS — I10 Essential (primary) hypertension: Secondary | ICD-10-CM | POA: Diagnosis not present

## 2018-01-05 DIAGNOSIS — E559 Vitamin D deficiency, unspecified: Secondary | ICD-10-CM | POA: Diagnosis not present

## 2018-01-05 DIAGNOSIS — N183 Chronic kidney disease, stage 3 (moderate): Secondary | ICD-10-CM | POA: Diagnosis not present

## 2018-01-05 DIAGNOSIS — E781 Pure hyperglyceridemia: Secondary | ICD-10-CM | POA: Diagnosis not present

## 2018-01-16 ENCOUNTER — Ambulatory Visit
Admission: RE | Admit: 2018-01-16 | Discharge: 2018-01-16 | Disposition: A | Discharge status: Home / Self Care | Payer: PPO | Source: Ambulatory Visit | Attending: Neurology | Admitting: Neurology

## 2018-01-16 DIAGNOSIS — G3281 Cerebellar ataxia in diseases classified elsewhere: Secondary | ICD-10-CM

## 2018-01-17 ENCOUNTER — Ambulatory Visit: Payer: PPO | Admitting: Neurology

## 2018-01-17 ENCOUNTER — Encounter: Payer: Self-pay | Admitting: Neurology

## 2018-01-17 VITALS — BP 143/91 | HR 83 | Ht 67.0 in | Wt 207.5 lb

## 2018-01-17 DIAGNOSIS — G3281 Cerebellar ataxia in diseases classified elsewhere: Secondary | ICD-10-CM

## 2018-01-17 DIAGNOSIS — R269 Unspecified abnormalities of gait and mobility: Secondary | ICD-10-CM | POA: Diagnosis not present
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
## 2018-01-18 DIAGNOSIS — G3281 Cerebellar ataxia in diseases classified elsewhere: Secondary | ICD-10-CM | POA: Insufficient documentation

## 2018-01-18 DIAGNOSIS — R269 Unspecified abnormalities of gait and mobility: Secondary | ICD-10-CM | POA: Insufficient documentation

## 2018-01-19 ENCOUNTER — Telehealth: Payer: Self-pay | Admitting: Neurology

## 2018-01-19 NOTE — Telephone Encounter (Signed)
Spoke with YY.  She will call pt's wife/fim

## 2018-01-19 NOTE — Telephone Encounter (Signed)
Pt's wife called requesting lab results. Michela Pitcher also she was unable to speak with Dr Krista Blue with her husband present at the [place] . She wanted to tell Dr Krista Blue he has become belligerent during the day. He has no patience with her and it gets worse in evening. She said he becomes enraged, he also has mood swings. She said he is dizzy all the time not sporadic as he said.  She is wanting to speak with Dr Krista Blue if possible and can be reached at [phone]. She is aware the clinic closes at noon today and may not receive a call until Monday.

## 2018-01-20 LAB — ANA W/REFLEX: ANA: NEGATIVE

## 2018-01-20 LAB — THYROID PANEL WITH TSH
Free Thyroxine Index: 1.8 (ref 1.2–4.9)
T3 Uptake Ratio: 26 % (ref 24–39)
T4 TOTAL: 6.9 ug/dL (ref 4.5–12.0)
TSH: 1.81 u[IU]/mL (ref 0.450–4.500)

## 2018-01-20 LAB — FERRITIN: FERRITIN: 135 ng/mL (ref 30–400)

## 2018-01-20 LAB — SEDIMENTATION RATE: Sed Rate: 2 mm/hr (ref 0–30)

## 2018-01-20 LAB — CK: CK TOTAL: 101 U/L (ref 24–204)

## 2018-01-20 LAB — C-REACTIVE PROTEIN: CRP: 0.5 mg/L (ref 0.0–4.9)

## 2018-01-20 LAB — COPPER, SERUM: COPPER: 63 ug/dL — AB (ref 72–166)

## 2018-01-22 NOTE — Telephone Encounter (Signed)
I checked the laboratory evaluations, extensive laboratory evaluation showed a low vitamin D 20,  A1c was 5.6  Copper level was decreased 63, rest of the laboratory evaluation showed no significant abnormality  Wife stated that patient has sundowning phenomenon, agitated at evening time, but his behavior issue is not bad enough to warrant treatment at this time  Wife also reported that patient has take different over-the-counter supplements throughout the years,  was not sure about the zinc contact,  I have advised him to stop over-the-counter supplement with exception of keep vitamin D 3 supplement as previously prescribed, continue daily aspirin

## 2018-01-30 DIAGNOSIS — N183 Chronic kidney disease, stage 3 (moderate): Secondary | ICD-10-CM | POA: Diagnosis not present

## 2018-01-30 DIAGNOSIS — I10 Essential (primary) hypertension: Secondary | ICD-10-CM | POA: Diagnosis not present

## 2018-01-30 DIAGNOSIS — N2581 Secondary hyperparathyroidism of renal origin: Secondary | ICD-10-CM | POA: Diagnosis not present

## 2018-01-30 DIAGNOSIS — R600 Localized edema: Secondary | ICD-10-CM | POA: Diagnosis not present

## 2018-02-02 ENCOUNTER — Ambulatory Visit: Payer: PPO | Admitting: Rehabilitation

## 2018-02-05 ENCOUNTER — Ambulatory Visit: Payer: PPO | Attending: Neurology | Admitting: Physical Therapy

## 2018-02-05 ENCOUNTER — Encounter: Payer: Self-pay | Admitting: Physical Therapy

## 2018-02-05 DIAGNOSIS — M6281 Muscle weakness (generalized): Secondary | ICD-10-CM | POA: Diagnosis not present

## 2018-02-05 DIAGNOSIS — R2681 Unsteadiness on feet: Secondary | ICD-10-CM

## 2018-02-05 DIAGNOSIS — R2689 Other abnormalities of gait and mobility: Secondary | ICD-10-CM | POA: Insufficient documentation
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
## 2018-02-09 ENCOUNTER — Ambulatory Visit (INDEPENDENT_AMBULATORY_CARE_PROVIDER_SITE_OTHER): Payer: PPO | Admitting: Neurology

## 2018-02-09 DIAGNOSIS — R202 Paresthesia of skin: Secondary | ICD-10-CM | POA: Diagnosis not present

## 2018-02-09 DIAGNOSIS — R269 Unspecified abnormalities of gait and mobility: Secondary | ICD-10-CM

## 2018-02-09 DIAGNOSIS — Z0289 Encounter for other administrative examinations: Secondary | ICD-10-CM

## 2018-02-09 DIAGNOSIS — G3281 Cerebellar ataxia in diseases classified elsewhere: Secondary | ICD-10-CM

## 2018-02-09 MED ORDER — OXYBUTYNIN CHLORIDE ER 5 MG PO TB24: 5.0000 mg | ORAL_TABLET | Freq: Every day | ORAL | 11 refills | Status: DC
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
## 2018-02-09 NOTE — Procedures (Signed)
Full Name: Brendan Nguyen Gender: Male MRN #: [id number] Date of Birth: [birth year]    Visit Date: 02/09/18 11:10 Age: 77 Years 26 Months Old Examining Physician: Marcial Pacas, MD  Referring Physician: Krista Blue, MD History: 77 year old male, with more than 2 years history of gradual worsening gait abnormality, bilateral lower extremity paresthesia,  Summary of the tests:  Nerve conduction study:  Bilateral sural, superficial peroneal sensory responses were absent.  Bilateral tibial, peroneal to EDB motor responses showed normal C map amplitude with mild slow conduction velocity.  Right ulnar sensory response was absent, right median sensory response showed borderline peak latency with mildly decreased to snap amplitude.  Right ulnar and median motor responses were normal.  Right radial sensory response showed borderline peak latency with significant decreased to snap amplitude.    Electromyography: Selective needle examinations were performed at the right upper, lower extremity muscles, right lumbosacral paraspinal muscles.    There was no significant abnormality found.   Conclusion: This is an abnormal study.  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 There is no evidence of lumbar radiculopathy or inflammatory myopathy.    ------------------------------- Marcial Pacas, M.D.  [facility] [place], [place] [address] Tel: [phone] Fax: [phone]        [facility]    Nerve / Sites Muscle Latency Ref. Amplitude Ref. Rel Amp Segments Distance Velocity Ref. Area    ms ms mV mV %  cm m/s m/s mVms  R Median - APB     Wrist APB 4.2 ?4.4 6.4 ?4.0 100 Wrist - APB 7   19.9     Upper arm APB 8.5  4.9  76.5 Upper arm - Wrist 21 49 ?49 15.1  R Ulnar - ADM     Wrist ADM 3.9 ?3.3 7.0 ?6.0 100 Wrist - ADM 7   16.2     B.Elbow ADM 8.3  5.9  84 B.Elbow - Wrist 19 42 ?49 12.7     A.Elbow ADM 10.8  4.8  82.1 A.Elbow - B.Elbow 10 41 ?49  12.0         A.Elbow - Wrist      R Peroneal - EDB     Ankle EDB 5.5 ?6.5 4.2 ?2.0 100 Ankle - EDB 9   13.2     Fib head EDB 12.6  4.6  109 Fib head - Ankle 30 43 ?44 16.1     Pop fossa EDB 14.9  4.2  91 Pop fossa - Fib head 10 43 ?44 15.6         Pop fossa - Ankle      L Peroneal - EDB     Ankle EDB 5.0 ?6.5 5.1 ?2.0 100 Ankle - EDB 9   19.7     Fib head EDB 12.2  4.7  93.7 Fib head - Ankle 30 42 ?44 19.7     Pop fossa EDB 15.0  4.5  95.1 Pop fossa - Fib head 12 43 ?44 19.7         Pop fossa - Ankle      R Tibial - AH     Ankle AH 5.6 ?5.8 5.6 ?4.0 100 Ankle - AH 9   20.2     Pop fossa AH 14.6  4.7  83.7 Pop fossa - Ankle 34 38 ?41 21.3  L Tibial - AH     Ankle AH 6.3 ?5.8 3.6 ?4.0 100 Ankle - AH 9  10.1     Pop fossa AH 16.3  3.1  88.4 Pop fossa - Ankle 34 34 ?41 10.2                      SNC    Nerve / Sites Rec. Site Peak Lat Ref.  Amp Ref. Segments Distance    ms ms V V  cm  R Radial - Anatomical snuff box (Forearm)     Forearm Wrist 2.9 ?2.9 5 ?15 Forearm - Wrist 10  L Sural - Ankle (Calf)     Calf Ankle NR ?4.4 NR ?6 Calf - Ankle 14  R Sural - Ankle (Calf)     Calf Ankle NR ?4.4 NR ?6 Calf - Ankle 14  R Superficial peroneal - Ankle     Lat leg Ankle NR ?4.4 NR ?6 Lat leg - Ankle 14  L Superficial peroneal - Ankle     Lat leg Ankle NR ?4.4 NR ?6 Lat leg - Ankle 14  R Median - Orthodromic (Dig II, Mid palm)     Dig II Wrist 3.4 ?3.4 7 ?10 Dig II - Wrist 13  R Ulnar - Orthodromic, (Dig V, Mid palm)     Dig V Wrist NR ?3.1 NR ?5 Dig V - Wrist 102                     F  Wave    Nerve F Lat Ref.   ms ms  R Tibial - AH 62.2 ?56.0  L Tibial - AH 59.3 ?56.0  R Ulnar - ADM 34.8 ?32.0           EMG full       EMG Summary Table    Spontaneous MUAP Recruitment  Muscle IA Fib PSW Fasc Other Amp Dur. Poly Pattern  R. Tibialis anterior Normal None None None _______ Normal Normal Normal Normal  R. Tibialis posterior Normal None None None _______ Normal Normal Normal  Normal  R. Peroneus longus Normal None None None _______ Normal Normal Normal Normal  R. Vastus lateralis Normal None None None _______ Normal Normal Normal Normal  R. Lumbar paraspinals (mid) Normal None None None _______ Normal Normal Normal Normal  R. Lumbar paraspinals (low) Normal None None None _______ Normal Normal Normal Normal  R. First dorsal interosseous Normal None None None _______ Normal Normal Normal Normal  R. Pronator teres Normal None None None _______ Normal Normal Normal Normal  R. Biceps brachii Normal None None None _______ Normal Normal Normal Normal  R. Deltoid Normal None None None _______ Normal Normal Normal Normal  R. Triceps brachii Normal None None None _______ Normal Normal Normal Normal

## 2018-02-09 NOTE — Patient Instructions (Signed)
Address: [address], [place], [place] [address]  Hours:  Phone: [phone]  [facility]

## 2018-02-11 ENCOUNTER — Ambulatory Visit
Admission: RE | Admit: 2018-02-11 | Discharge: 2018-02-11 | Disposition: A | Discharge status: Home / Self Care | Payer: PPO | Source: Ambulatory Visit | Attending: Neurology | Admitting: Neurology

## 2018-02-11 DIAGNOSIS — G3281 Cerebellar ataxia in diseases classified elsewhere: Secondary | ICD-10-CM | POA: Diagnosis not present

## 2018-02-11 MED ORDER — GADOBENATE DIMEGLUMINE 529 MG/ML IV SOLN
19.0000 mL | Freq: Once | INTRAVENOUS | Status: AC | PRN
  Administered 2018-02-11: 19 mL via INTRAVENOUS

## 2018-02-12 ENCOUNTER — Encounter: Payer: Self-pay | Admitting: Physical Therapy

## 2018-02-12 ENCOUNTER — Ambulatory Visit: Payer: PPO | Attending: Neurology | Admitting: Physical Therapy

## 2018-02-12 DIAGNOSIS — R262 Difficulty in walking, not elsewhere classified: Secondary | ICD-10-CM | POA: Diagnosis not present

## 2018-02-12 DIAGNOSIS — R2689 Other abnormalities of gait and mobility: Secondary | ICD-10-CM | POA: Diagnosis not present

## 2018-02-12 DIAGNOSIS — M6281 Muscle weakness (generalized): Secondary | ICD-10-CM | POA: Insufficient documentation

## 2018-02-12 DIAGNOSIS — R2681 Unsteadiness on feet: Secondary | ICD-10-CM | POA: Diagnosis not present

## 2018-02-12 NOTE — Therapy (Signed)
[facility] [address] [place], [place], [address] Phone: [phone]   Fax:  [phone]  Physical Therapy Treatment  Patient Details  Name: Brendan Nguyen MRN: [id number] Date of Birth: [birth year] Referring Provider: Marcial Pacas, MD   Encounter Date: 02/12/2018  PT End of Session - 02/12/18 1207    Visit Number  2    Number of Visits  16    Date for PT Re-Evaluation  04/02/18    PT Start Time  1100    PT Stop Time  1145    PT Time Calculation (min)  45 min    Equipment Utilized During Treatment  Gait belt    Activity Tolerance  Patient tolerated treatment well    Behavior During Therapy  [facility] for tasks assessed/performed       Past Medical History:  Diagnosis Date  . BPH (benign prostatic hyperplasia)   . Chronic kidney disease   . Elevated blood uric acid level   . GERD (gastroesophageal reflux disease)   . Hyperparathyroidism ([place])   . Hypertension   . TIA (transient ischemic attack) 2013    Past Surgical History:  Procedure Laterality Date  . ACHILLES TENDON REPAIR    . L5 lamenectomy     had surgery but not the complete surgery  . l5-s1 laninectomy    . left inguinal hernia      There were no vitals filed for this visit.  Subjective Assessment - 02/12/18 1059    Subjective  EMG/NCV on Friday with Dr. Krista Blue - she can't determine why he has such difficulty moving and doesn't think it's NPH, but rather a CNS disease.  MD recommended second opinion at [facility].      Patient Stated Goals  return to walking without AD, wants to ride motorcycle    Currently in Pain?  No/denies                      [facility] Adult PT Treatment/Exercise - 02/12/18 1203      Self-Care   Self-Care  Other Self-Care Comments    Other Self-Care Comments   discussed recent MD visit; and her request for 2nd opinion as well as what she feels is going on.  Educated pt/wife on progressive diseases and PT's role in these.   Feel he will benefit from PT, but given cognitive changes unlikley to see significant carryover and functional change unless pt and wife are able to work well together at home (which seems to be difficult at this time).  Pt's wife wants pt to continue PT, and PT agreeable as long as pt is doing HEP and we can justify progress.  May need to hold PT eventually until after pt sees MD at [place] Exercises - 02/12/18 [place]   Head Movements  Sitting;5 reps    Neck Movements  Sitting;5 reps    Back Extension  Standing;5 reps RW support    Trunk Movements  Standing;5 reps with RW support    Ankle Movements  Sitting;10 reps    Knee Extensor  10 reps;Weight (comment) 2#    Knee Flexor  10 reps;Weight (comment) 2#    Hip ABductor  10 reps;Weight (comment) 2#    Ankle Plantorflexors  20 reps, support    Ankle Dorsiflexors  20 reps, support    Knee Bends  10 reps, support  Backwards Walking  Support        PT Education - 02/12/18 1203    Education provided  Yes    Education Details  initiated OTAGO, see self care    Person(s) Educated  Patient;Spouse    Methods  Explanation    Comprehension  Verbalized understanding       PT Short Term Goals - 02/05/18 1218      PT SHORT TERM GOAL #1   Title  verbalize understanding of fall prevention strategies    Status  New    Target Date  03/05/18      PT SHORT TERM GOAL #2   Title  improve BERG balance score to >/=37/56 for improved function    Status  New    Target Date  03/05/18      PT SHORT TERM GOAL #3   Title  perform TUG and gait velocity with LTGs to be written    Status  New    Target Date  03/05/18      PT SHORT TERM GOAL #4   Title  amb >150' with LRAD with supervision for improved function and mobility    Status  New    Target Date  03/05/18        PT Long Term Goals - 02/05/18 1219      PT LONG TERM GOAL #1   Title  independent with HEP    Status  New    Target Date  04/02/18       PT LONG TERM GOAL #2   Title  improve BERG to >/= 45/56 for improved balance    Status  New    Target Date  04/02/18      PT LONG TERM GOAL #3   Title  amb > 500' with LRAD on indoor/paved outdoor surfaces modified independent for improved community access    Status  New    Target Date  04/02/18      PT LONG TERM GOAL #4   Title  TUG and GV goals to follow      PT LONG TERM GOAL #5   Title  n/a      PT LONG TERM GOAL #6   Title  n/a      PT LONG TERM GOAL #7   Title  n/a            Plan - 02/12/18 1207    Clinical Impression Statement  Pt tolerated session well today with initiation of HEP.  Long discussion with pt and wife about recent MD visit, and her recommendations.  May need to hold PT until they get 2nd opinion once HEP and community fitness programs well established, but will plan to make that decision when appt is scheduled.     PT Treatment/Interventions  Gait training;Therapeutic activities;Therapeutic exercise;Balance training;Neuromuscular re-education;Patient/family education;Stair training;Functional mobility training;Energy conservation;Manual techniques;ADLs/Self Care Home Management;Canalith Repostioning;Vestibular    PT Next Visit Plan  finish OTAGO, try PWR moves, gait training    Consulted and Agree with Plan of Care  Patient;Family member/caregiver    Family Member Consulted  wife       Patient will benefit from skilled therapeutic intervention in order to improve the following deficits and impairments:  Abnormal gait, Decreased cognition, Decreased balance, Decreased endurance, Decreased activity tolerance, Decreased knowledge of precautions, Decreased safety awareness, Difficulty walking, Decreased strength, Decreased mobility  Visit Diagnosis: Other abnormalities of gait and mobility  Muscle weakness (generalized)  Unsteadiness on feet  Difficulty in walking,  not elsewhere classified     Problem List Patient Active Problem List    Diagnosis Date Noted  . Gait abnormality 01/18/2018  . Cerebellar ataxia in diseases classified elsewhere ([place]) 01/18/2018  . Abnormal gait 06/21/2017  . Mixed hyperlipidemia 03/30/2017  . Stage 3 chronic kidney disease ([place]) 03/30/2017  . Polyneuropathy 03/29/2017  . Dizzy spells 03/29/2017  . HBP (high blood pressure) 11/16/2015      Laureen Abrahams, PT, DPT 02/12/18 12:09 PM     [facility] [address] [place], [place], [address] Phone: [phone]   Fax:  [phone]  Name: Brendan Nguyen MRN: [id number] Date of Birth: [birth year]

## 2018-02-14 ENCOUNTER — Ambulatory Visit: Payer: PPO | Admitting: Physical Therapy

## 2018-02-14 ENCOUNTER — Encounter: Payer: Self-pay | Admitting: Physical Therapy

## 2018-02-14 ENCOUNTER — Telehealth: Payer: Self-pay | Admitting: Neurology

## 2018-02-14 DIAGNOSIS — R2681 Unsteadiness on feet: Secondary | ICD-10-CM

## 2018-02-14 DIAGNOSIS — R2689 Other abnormalities of gait and mobility: Secondary | ICD-10-CM | POA: Diagnosis not present

## 2018-02-14 DIAGNOSIS — M6281 Muscle weakness (generalized): Secondary | ICD-10-CM

## 2018-02-14 NOTE — Telephone Encounter (Signed)
Patient's wife wants a call back regarding was an MRI don as well when MRA was completed of the brain. If not, she questions why. If it was done she wants the results. Best call back [phone]

## 2018-02-14 NOTE — Therapy (Signed)
[facility] [address] [place], [place], [address] Phone: [phone]   Fax:  [phone]  Physical Therapy Treatment  Patient Details  Name: Brendan Nguyen MRN: [id number] Date of Birth: 1940/12/29 Referring Provider: Marcial Pacas, MD   Encounter Date: 02/14/2018  PT End of Session - 02/14/18 1327    Visit Number  3    Number of Visits  16    Date for PT Re-Evaluation  04/02/18    PT Start Time  5397    PT Stop Time  1312    PT Time Calculation (min)  41 min    Equipment Utilized During Treatment  Gait belt    Activity Tolerance  Patient tolerated treatment well    Behavior During Therapy  [facility] for tasks assessed/performed       Past Medical History:  Diagnosis Date  . BPH (benign prostatic hyperplasia)   . Chronic kidney disease   . Elevated blood uric acid level   . GERD (gastroesophageal reflux disease)   . Hyperparathyroidism ([place])   . Hypertension   . TIA (transient ischemic attack) 2013    Past Surgical History:  Procedure Laterality Date  . ACHILLES TENDON REPAIR    . L5 lamenectomy     had surgery but not the complete surgery  . l5-s1 laninectomy    . left inguinal hernia      There were no vitals filed for this visit.  Subjective Assessment - 02/14/18 1242    Subjective  MRA was normal, did exercised Monday night and twice yesterday.  No falls, has some back pain with exercises due to tight hamstrings    Patient Stated Goals  return to walking without AD, wants to ride motorcycle    Currently in Pain?  No/denies                      [facility] Adult PT Treatment/Exercise - 02/14/18 1315      Ambulation/Gait   Ambulation/Gait  Yes    Ambulation/Gait Assistance  5: Supervision    Ambulation Distance (Feet)  100 Feet    Assistive device  Rolling walker    Gait Pattern  Shuffle;Trunk flexed;Decreased hip/knee flexion - right;Decreased hip/knee flexion - left;Decreased dorsiflexion -  right;Decreased dorsiflexion - left    Gait Comments  cues for heel strike and step length; limited carryover and only able to maintain for 2-3 steps      Exercises   Exercises  Knee/Hip      Knee/Hip Exercises: Stretches   Passive Hamstring Stretch  Both;2 reps;30 seconds    Passive Hamstring Stretch Limitations  seated          Balance Exercises - 02/14/18 1245      OTAGO PROGRAM   Walking and Turning Around  Assistive device    Sideways Walking  Assistive device    Tandem Stance  10 seconds, support    Tandem Walk  Support    One Leg Stand  10 seconds, support    Heel Walking  -- attempted; unable    Toe Walk  -- attempted; unable    Sit to Stand  20 reps, one support        PT Education - 02/14/18 1327    Education provided  Yes    Education Details  OTAGO, hamstring stretch    Person(s) Educated  Patient;Spouse    Methods  Explanation;Demonstration;Handout    Comprehension  Verbalized understanding;Returned demonstration  PT Short Term Goals - 02/05/18 1218      PT SHORT TERM GOAL #1   Title  verbalize understanding of fall prevention strategies    Status  New    Target Date  03/05/18      PT SHORT TERM GOAL #2   Title  improve BERG balance score to >/=37/56 for improved function    Status  New    Target Date  03/05/18      PT SHORT TERM GOAL #3   Title  perform TUG and gait velocity with LTGs to be written    Status  New    Target Date  03/05/18      PT SHORT TERM GOAL #4   Title  amb >150' with LRAD with supervision for improved function and mobility    Status  New    Target Date  03/05/18        PT Long Term Goals - 02/05/18 1219      PT LONG TERM GOAL #1   Title  independent with HEP    Status  New    Target Date  04/02/18      PT LONG TERM GOAL #2   Title  improve BERG to >/= 45/56 for improved balance    Status  New    Target Date  04/02/18      PT LONG TERM GOAL #3   Title  amb > 500' with LRAD on indoor/paved outdoor  surfaces modified independent for improved community access    Status  New    Target Date  04/02/18      PT LONG TERM GOAL #4   Title  TUG and GV goals to follow      PT LONG TERM GOAL #5   Title  n/a      PT LONG TERM GOAL #6   Title  n/a      PT LONG TERM GOAL #7   Title  n/a            Plan - 02/14/18 1328    Clinical Impression Statement  Completed OTAGO today with pt needing 3 seated rest breaks due to fatigue.  Pt tolerated remainder of OTAGO well, and issued this as well as hamstring stretch for home.  Will continue to benefit from PT to maximize function.  Rehab potential may be limited due to poor carryover.    PT Treatment/Interventions  Gait training;Therapeutic activities;Therapeutic exercise;Balance training;Neuromuscular re-education;Patient/family education;Stair training;Functional mobility training;Energy conservation;Manual techniques;ADLs/Self Care Home Management;Canalith Repostioning;Vestibular    PT Next Visit Plan  try PWR moves, gait training, review OTAGO PRN, nustep and balance exercises    Consulted and Agree with Plan of Care  Patient;Family member/caregiver    Family Member Consulted  wife       Patient will benefit from skilled therapeutic intervention in order to improve the following deficits and impairments:  Abnormal gait, Decreased cognition, Decreased balance, Decreased endurance, Decreased activity tolerance, Decreased knowledge of precautions, Decreased safety awareness, Difficulty walking, Decreased strength, Decreased mobility  Visit Diagnosis: Other abnormalities of gait and mobility  Muscle weakness (generalized)  Unsteadiness on feet     Problem List Patient Active Problem List   Diagnosis Date Noted  . Gait abnormality 01/18/2018  . Cerebellar ataxia in diseases classified elsewhere ([place]) 01/18/2018  . Abnormal gait 06/21/2017  . Mixed hyperlipidemia 03/30/2017  . Stage 3 chronic kidney disease ([place]) 03/30/2017  .  Polyneuropathy 03/29/2017  . Dizzy spells 03/29/2017  . HBP (high  blood pressure) 11/16/2015      Laureen Abrahams, PT, DPT 02/14/18 1:30 PM    [facility] [address] [place], [place], [address] Phone: [phone]   Fax:  [phone]  Name: JOANDY BURGET MRN: [id number] Date of Birth: 1940-12-28

## 2018-02-14 NOTE — Patient Instructions (Signed)
HIP: Hamstrings - Short Sitting    Rest leg on floor. Keep knee straight. Lift chest. Hold __20-30_ seconds. __2-3_ reps per set, _1-2__ sets per day, _7__ days per week  Copyright  VHI. All rights reserved.

## 2018-02-15 NOTE — Telephone Encounter (Signed)
I called pt's wife, [name], per DPR, and advised her of this information from Dr. Krista Blue. Pt's wife verbalized understanding and appreciation for my call.

## 2018-02-15 NOTE — Telephone Encounter (Signed)
Please call patient and his wife, MRI of the brain was normal in October 2017, which showed generalized atrophy, mild supratentorium small vessel disease,  MRI of the brain is to look at the structure of the brain, MRA of the brain and neck is to look at the vascular supply to the brain,  MRI of the brain and neck in March 2019 showed no large vessel disease.

## 2018-02-15 NOTE — Telephone Encounter (Signed)
I called pt to discuss. No answer, left a home or cell number asking pt to call me back.

## 2018-02-19 ENCOUNTER — Encounter: Payer: Self-pay | Admitting: Physical Therapy

## 2018-02-19 ENCOUNTER — Ambulatory Visit: Payer: PPO | Admitting: Physical Therapy

## 2018-02-19 DIAGNOSIS — R262 Difficulty in walking, not elsewhere classified: Secondary | ICD-10-CM

## 2018-02-19 DIAGNOSIS — R2681 Unsteadiness on feet: Secondary | ICD-10-CM

## 2018-02-19 DIAGNOSIS — M6281 Muscle weakness (generalized): Secondary | ICD-10-CM

## 2018-02-19 DIAGNOSIS — R2689 Other abnormalities of gait and mobility: Secondary | ICD-10-CM

## 2018-02-19 NOTE — Therapy (Signed)
[facility] [address] [place], [place], [address] Phone: [phone]   Fax:  [phone]  Physical Therapy Treatment  Patient Details  Name: Brendan Nguyen MRN: [id number] Date of Birth: [birth year] Referring Provider: Marcial Pacas, MD   Encounter Date: 02/19/2018  PT End of Session - 02/19/18 1342    Visit Number  4    Number of Visits  16    Date for PT Re-Evaluation  04/02/18    PT Start Time  1300    PT Stop Time  1345    PT Time Calculation (min)  45 min    Equipment Utilized During Treatment  Gait belt    Activity Tolerance  Patient tolerated treatment well    Behavior During Therapy  [facility] for tasks assessed/performed       Past Medical History:  Diagnosis Date  . BPH (benign prostatic hyperplasia)   . Chronic kidney disease   . Elevated blood uric acid level   . GERD (gastroesophageal reflux disease)   . Hyperparathyroidism ([place])   . Hypertension   . TIA (transient ischemic attack) 2013    Past Surgical History:  Procedure Laterality Date  . ACHILLES TENDON REPAIR    . L5 lamenectomy     had surgery but not the complete surgery  . l5-s1 laninectomy    . left inguinal hernia      There were no vitals filed for this visit.  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  Negative for hemorrhage or mass.    Patient Stated Goals  return to walking without AD, wants to ride motorcycle    Currently in Pain?  No/denies                      [facility] Adult PT Treatment/Exercise - 02/19/18 1322      Ambulation/Gait   Ambulation/Gait  Yes    Ambulation/Gait Assistance  4: Min guard    Ambulation Distance  (Feet)  150 Feet    Assistive device  Rolling walker    Gait Comments  added visual cues to RW for step length with decreased shuffling noted      Self-Care   Self-Care  Other Self-Care Comments    Other Self-Care Comments   discussion with pt's wife while pt in restroom about neurological progressive diagnoses and role of PT in the setting; also discussed research on benefits of PT in pt's with cognitive deficits and that most progress is seen when pt is willing to listen and work with caregiver (wife) which wife reports isn't the case at this time.        Knee/Hip Exercises: Aerobic   Stepper  SciFit L1.0 x 5 min      Knee/Hip Exercises: Standing   Forward Step Up  Both;10 reps;Hand Hold: 2;Step Height: 4"        PWR [facility]) - 02/19/18 1328    PWR! exercises  Moves in standing    PWR! Up  x10    PWR! Rock  x10 bil    PWR Step  x10 bil    Comments  standing          PT Education - 02/19/18  1351    Education provided  Yes    Education Details  see self care    Person(s) Educated  Spouse    Methods  Explanation    Comprehension  Verbalized understanding       PT Short Term Goals - 02/05/18 1218      PT SHORT TERM GOAL #1   Title  verbalize understanding of fall prevention strategies    Status  New    Target Date  03/05/18      PT SHORT TERM GOAL #2   Title  improve BERG balance score to >/=37/56 for improved function    Status  New    Target Date  03/05/18      PT SHORT TERM GOAL #3   Title  perform TUG and gait velocity with LTGs to be written    Status  New    Target Date  03/05/18      PT SHORT TERM GOAL #4   Title  amb >150' with LRAD with supervision for improved function and mobility    Status  New    Target Date  03/05/18        PT Long Term Goals - 02/05/18 1219      PT LONG TERM GOAL #1   Title  independent with HEP    Status  New    Target Date  04/02/18      PT LONG TERM GOAL #2   Title  improve BERG to >/= 45/56 for improved balance     Status  New    Target Date  04/02/18      PT LONG TERM GOAL #3   Title  amb > 500' with LRAD on indoor/paved outdoor surfaces modified independent for improved community access    Status  New    Target Date  04/02/18      PT LONG TERM GOAL #4   Title  TUG and GV goals to follow      PT LONG TERM GOAL #5   Title  n/a      PT LONG TERM GOAL #6   Title  n/a      PT LONG TERM GOAL #7   Title  n/a            Plan - 02/19/18 1352    Clinical Impression Statement  Pt demonstrated significantly improved step length with visual feedback with RW, and wife still seems hesistant to pt demonstrating progress.  Wife frustrated with pt not listening to pt at home but advised this is due to his cognitive changes.  Will cotninue to benefit from PT to maximize function, but may be more appropriate for transition to HHPT depending on how pt progresses.    PT Treatment/Interventions  Gait training;Therapeutic activities;Therapeutic exercise;Balance training;Neuromuscular re-education;Patient/family education;Stair training;Functional mobility training;Energy conservation;Manual techniques;ADLs/Self Care Home Management;Canalith Repostioning;Vestibular    PT Next Visit Plan  try PWR moves, gait training, review OTAGO PRN, nustep and balance exercises    Consulted and Agree with Plan of Care  Patient;Family member/caregiver    Family Member Consulted  wife       Patient will benefit from skilled therapeutic intervention in order to improve the following deficits and impairments:  Abnormal gait, Decreased cognition, Decreased balance, Decreased endurance, Decreased activity tolerance, Decreased knowledge of precautions, Decreased safety awareness, Difficulty walking, Decreased strength, Decreased mobility  Visit Diagnosis: Other abnormalities of gait and mobility  Muscle weakness (generalized)  Unsteadiness on feet  Difficulty in walking, not elsewhere  classified     Problem List Patient  Active Problem List   Diagnosis Date Noted  . Gait abnormality 01/18/2018  . Cerebellar ataxia in diseases classified elsewhere ([place]) 01/18/2018  . Abnormal gait 06/21/2017  . Mixed hyperlipidemia 03/30/2017  . Stage 3 chronic kidney disease ([place]) 03/30/2017  . Polyneuropathy 03/29/2017  . Dizzy spells 03/29/2017  . HBP (high blood pressure) 11/16/2015      Laureen Abrahams, PT, DPT 02/19/18 1:54 PM    [facility] [address] [place], [place], [address] Phone: [phone]   Fax:  [phone]  Name: JEDD SCHULENBURG MRN: [id number] Date of Birth: September 24, 1941

## 2018-02-21 ENCOUNTER — Ambulatory Visit: Payer: PPO | Admitting: Physical Therapy

## 2018-02-21 ENCOUNTER — Encounter: Payer: Self-pay | Admitting: Physical Therapy

## 2018-02-21 DIAGNOSIS — R2689 Other abnormalities of gait and mobility: Secondary | ICD-10-CM

## 2018-02-21 DIAGNOSIS — R2681 Unsteadiness on feet: Secondary | ICD-10-CM

## 2018-02-21 DIAGNOSIS — M6281 Muscle weakness (generalized): Secondary | ICD-10-CM

## 2018-02-21 NOTE — Patient Instructions (Signed)
Feet Apart (Compliant Surface) Head Motion - Eyes Open    With eyes open, standing on compliant surface: __pillow or cushion___, feet shoulder width apart, move head slowly: up and down 10 times and side to side 10 times. Repeat __1-2__ times per session. Do _1-2___ sessions per day.  Copyright  VHI. All rights reserved.    Feet Apart (Compliant Surface) Varied Arm Positions - Eyes Closed    Stand on compliant surface: __pillow or cushion___ with feet shoulder width apart and arms AT YOUR SIDE. Close eyes and visualize upright position. Hold__10-15__ seconds. Repeat __5__ times per session. Do __1-2__ sessions per day.  Copyright  VHI. All rights reserved.

## 2018-02-21 NOTE — Therapy (Signed)
[facility] [address] [place], [place], [address] Phone: [phone]   Fax:  [phone]  Physical Therapy Treatment  Patient Details  Name: Brendan Nguyen MRN: [id number] Date of Birth: 1941-08-29 Referring Provider: Marcial Pacas, MD   Encounter Date: 02/21/2018  PT End of Session - 02/21/18 1313    Visit Number  5    Number of Visits  16    Date for PT Re-Evaluation  04/02/18    PT Start Time  1230    PT Stop Time  1313    PT Time Calculation (min)  43 min    Equipment Utilized During Treatment  Gait belt    Activity Tolerance  Patient tolerated treatment well    Behavior During Therapy  [facility] for tasks assessed/performed       Past Medical History:  Diagnosis Date  . BPH (benign prostatic hyperplasia)   . Chronic kidney disease   . Elevated blood uric acid level   . GERD (gastroesophageal reflux disease)   . Hyperparathyroidism ([place])   . Hypertension   . TIA (transient ischemic attack) 2013    Past Surgical History:  Procedure Laterality Date  . ACHILLES TENDON REPAIR    . L5 lamenectomy     had surgery but not the complete surgery  . l5-s1 laninectomy    . left inguinal hernia      There were no vitals filed for this visit.  Subjective Assessment - 02/21/18 1224    Subjective  "sometimes I try to see if I can walk without the RW."  has to navigate 3 steps and feels like that is challenging.    Patient Stated Goals  return to walking without AD, wants to ride motorcycle    Currently in Pain?  No/denies                      [facility] Adult PT Treatment/Exercise - 02/21/18 1241      Ambulation/Gait   Ambulation/Gait  Yes    Ambulation/Gait Assistance  5: Supervision    Ambulation Distance (Feet)  500 Feet    Assistive device  Rolling walker    Stairs  Yes    Stairs Assistance  4: Min guard    Stair Management Technique  Sideways;One rail Left    Number of Stairs  4 2    Height of  Stairs  6 8    Gait Comments  multitasking with max cues for attention to task and ambulation; selective attention only          Balance Exercises - 02/21/18 1253      Balance Exercises: Standing   Standing Eyes Opened  Foam/compliant surface;Wide (BOA);Head turns attempted narrow BOS with head turns; unable    Standing Eyes Closed  Foam/compliant surface;Narrow base of support (BOS);5 reps;10 secs        PT Education - 02/21/18 1313    Education provided  Yes    Education Details  corner balance    Person(s) Educated  Patient;Spouse    Methods  Explanation    Comprehension  Verbalized understanding       PT Short Term Goals - 02/05/18 1218      PT SHORT TERM GOAL #1   Title  verbalize understanding of fall prevention strategies    Status  New    Target Date  03/05/18      PT SHORT TERM GOAL #2   Title  improve BERG balance  score to >/=37/56 for improved function    Status  New    Target Date  03/05/18      PT SHORT TERM GOAL #3   Title  perform TUG and gait velocity with LTGs to be written    Status  New    Target Date  03/05/18      PT SHORT TERM GOAL #4   Title  amb >18' with LRAD with supervision for improved function and mobility    Status  New    Target Date  03/05/18        PT Long Term Goals - 02/05/18 1219      PT LONG TERM GOAL #1   Title  independent with HEP    Status  New    Target Date  04/02/18      PT LONG TERM GOAL #2   Title  improve BERG to >/= 45/56 for improved balance    Status  New    Target Date  04/02/18      PT LONG TERM GOAL #3   Title  amb > 500' with LRAD on indoor/paved outdoor surfaces modified independent for improved community access    Status  New    Target Date  04/02/18      PT LONG TERM GOAL #4   Title  TUG and GV goals to follow      PT LONG TERM GOAL #5   Title  n/a      PT LONG TERM GOAL #6   Title  n/a      PT LONG TERM GOAL #7   Title  n/a            Plan - 02/21/18 1313    Clinical  Impression Statement  Pt with difficulty with dual task activity needing mod cues for attention.  Limited progress at this time due to cognition.    PT Treatment/Interventions  Gait training;Therapeutic activities;Therapeutic exercise;Balance training;Neuromuscular re-education;Patient/family education;Stair training;Functional mobility training;Energy conservation;Manual techniques;ADLs/Self Care Home Management;Canalith Repostioning;Vestibular    PT Next Visit Plan  try PWR moves, gait training, review OTAGO PRN, nustep and balance exercises    Consulted and Agree with Plan of Care  Patient;Family member/caregiver    Family Member Consulted  wife       Patient will benefit from skilled therapeutic intervention in order to improve the following deficits and impairments:  Abnormal gait, Decreased cognition, Decreased balance, Decreased endurance, Decreased activity tolerance, Decreased knowledge of precautions, Decreased safety awareness, Difficulty walking, Decreased strength, Decreased mobility  Visit Diagnosis: Other abnormalities of gait and mobility  Muscle weakness (generalized)  Unsteadiness on feet     Problem List Patient Active Problem List   Diagnosis Date Noted  . Gait abnormality 01/18/2018  . Cerebellar ataxia in diseases classified elsewhere ([place]) 01/18/2018  . Abnormal gait 06/21/2017  . Mixed hyperlipidemia 03/30/2017  . Stage 3 chronic kidney disease ([place]) 03/30/2017  . Polyneuropathy 03/29/2017  . Dizzy spells 03/29/2017  . HBP (high blood pressure) 11/16/2015      Laureen Abrahams, PT, DPT 02/21/18 1:15 PM     [facility] [address] [place], [place], [address] Phone: [phone]   Fax:  [phone]  Name: Brendan Nguyen MRN: [id number] Date of Birth: August 25, 1941

## 2018-02-26 ENCOUNTER — Encounter: Payer: Self-pay | Admitting: Physical Therapy

## 2018-02-26 ENCOUNTER — Ambulatory Visit: Payer: PPO | Admitting: Physical Therapy

## 2018-02-26 DIAGNOSIS — R2689 Other abnormalities of gait and mobility: Secondary | ICD-10-CM | POA: Diagnosis not present

## 2018-02-26 DIAGNOSIS — R262 Difficulty in walking, not elsewhere classified: Secondary | ICD-10-CM

## 2018-02-26 DIAGNOSIS — R2681 Unsteadiness on feet: Secondary | ICD-10-CM

## 2018-02-26 DIAGNOSIS — M6281 Muscle weakness (generalized): Secondary | ICD-10-CM

## 2018-02-26 NOTE — Therapy (Signed)
[facility] [address] [place], [place], [address] Phone: [phone]   Fax:  [phone]  Physical Therapy Treatment  Patient Details  Name: Brendan Nguyen MRN: [id number] Date of Birth: 17-Sep-1941 Referring Provider: Marcial Pacas, MD   Encounter Date: 02/26/2018  PT End of Session - 02/26/18 1136    Visit Number  6    Number of Visits  16    Date for PT Re-Evaluation  04/02/18    PT Start Time  1100    Activity Tolerance  Patient tolerated treatment well    Behavior During Therapy  [facility] for tasks assessed/performed       Past Medical History:  Diagnosis Date  . BPH (benign prostatic hyperplasia)   . Chronic kidney disease   . Elevated blood uric acid level   . GERD (gastroesophageal reflux disease)   . Hyperparathyroidism ([place])   . Hypertension   . TIA (transient ischemic attack) 2013    Past Surgical History:  Procedure Laterality Date  . ACHILLES TENDON REPAIR    . L5 lamenectomy     had surgery but not the complete surgery  . l5-s1 laninectomy    . left inguinal hernia      There were no vitals filed for this visit.  Subjective Assessment - 02/26/18 1054    Subjective  doing well; wife reports no changes at home.  wife states he has minimal changes except when she tells him to walk differently but typically with attitude from pt.    Pertinent History  Personal factors affecting rehab: PLOF, active, social support, falls risk, age, progressive symptoms     Patient Stated Goals  return to walking without AD, wants to ride motorcycle    Currently in Pain?  No/denies                      [facility] Adult PT Treatment/Exercise - 02/26/18 1104      Ambulation/Gait   Ambulation/Gait Assistance  5: Supervision    Ambulation Distance (Feet)  150 Feet    Assistive device  Rolling walker    Gait Pattern  Shuffle;Trunk flexed;Decreased hip/knee flexion - right;Decreased hip/knee flexion -  left;Decreased dorsiflexion - right;Decreased dorsiflexion - left    Gait Comments  cues for step length espeically with turns      Exercises   Exercises  Lumbar      Lumbar Exercises: Stretches   Single Knee to Chest Stretch  Right;Left;3 reps;10 seconds    Other Lumbar Stretch Exercise  book openers 3x10 sec      Lumbar Exercises: Supine   Ab Set  10 reps;5 seconds    Clam  10 reps green theraband    Bridge  10 reps;5 seconds      Knee/Hip Exercises: Aerobic   Stepper  L2.5 x 8 min             PT Education - 02/26/18 1135    Education provided  Yes    Education Details  HEP per pt request to help with flexibility before getting out of bed and core stabilization exercises    Person(s) Educated  Patient;Spouse    Methods  Explanation;Demonstration;Handout    Comprehension  Verbalized understanding;Returned demonstration;Need further instruction       PT Short Term Goals - 02/05/18 1218      PT SHORT TERM GOAL #1   Title  verbalize understanding of fall prevention strategies    Status  New  Target Date  03/05/18      PT SHORT TERM GOAL #2   Title  improve BERG balance score to >/=37/56 for improved function    Status  New    Target Date  03/05/18      PT SHORT TERM GOAL #3   Title  perform TUG and gait velocity with LTGs to be written    Status  New    Target Date  03/05/18      PT SHORT TERM GOAL #4   Title  amb >31' with LRAD with supervision for improved function and mobility    Status  New    Target Date  03/05/18        PT Long Term Goals - 02/05/18 1219      PT LONG TERM GOAL #1   Title  independent with HEP    Status  New    Target Date  04/02/18      PT LONG TERM GOAL #2   Title  improve BERG to >/= 45/56 for improved balance    Status  New    Target Date  04/02/18      PT LONG TERM GOAL #3   Title  amb > 500' with LRAD on indoor/paved outdoor surfaces modified independent for improved community access    Status  New    Target Date   04/02/18      PT LONG TERM GOAL #4   Title  TUG and GV goals to follow      PT LONG TERM GOAL #5   Title  n/a      PT LONG TERM GOAL #6   Title  n/a      PT LONG TERM GOAL #7   Title  n/a            Plan - 02/26/18 1136    Clinical Impression Statement  Pt demonstrates some improvements in mobility between sessions, but wife reports minimal carryover at home.  Will plan to check STGs starting next session and see how pt is doing.    PT Treatment/Interventions  Gait training;Therapeutic activities;Therapeutic exercise;Balance training;Neuromuscular re-education;Patient/family education;Stair training;Functional mobility training;Energy conservation;Manual techniques;ADLs/Self Care Home Management;Canalith Repostioning;Vestibular    PT Next Visit Plan  begin checking STGs, discuss continuing v/s d/c pending progress    Consulted and Agree with Plan of Care  Patient;Family member/caregiver    Family Member Consulted  wife       Patient will benefit from skilled therapeutic intervention in order to improve the following deficits and impairments:  Abnormal gait, Decreased cognition, Decreased balance, Decreased endurance, Decreased activity tolerance, Decreased knowledge of precautions, Decreased safety awareness, Difficulty walking, Decreased strength, Decreased mobility  Visit Diagnosis: Other abnormalities of gait and mobility  Muscle weakness (generalized)  Unsteadiness on feet  Difficulty in walking, not elsewhere classified     Problem List Patient Active Problem List   Diagnosis Date Noted  . Gait abnormality 01/18/2018  . Cerebellar ataxia in diseases classified elsewhere ([place]) 01/18/2018  . Abnormal gait 06/21/2017  . Mixed hyperlipidemia 03/30/2017  . Stage 3 chronic kidney disease ([place]) 03/30/2017  . Polyneuropathy 03/29/2017  . Dizzy spells 03/29/2017  . HBP (high blood pressure) 11/16/2015      Laureen Abrahams, PT, DPT 02/26/18 11:47  AM    [facility] [address] [place], [place], [address] Phone: [phone]   Fax:  [phone]  Name: Brendan Nguyen MRN: [id number] Date of Birth: [birth year]

## 2018-02-26 NOTE — Patient Instructions (Signed)
Knee to Chest (Flexion)    Pull knee toward chest. Feel stretch in lower back or buttock area. Breathing deeply, Hold _10-15___ seconds. Repeat with other knee. Repeat _3___ times. Do __in the morning before getting out of bed.__   Lower Trunk Rotation Stretch    Keeping back flat and feet together, rotate knees to left side. Hold __10-15__ seconds.  Repeat to other side. Repeat __3__ times per set. Do __1__ sets per session. Do in the morning before getting out of bed.   Lumbar Rotation: Caudal - Bilateral, Advanced (Supine)    Feet and knees together, arms outstretched, bring knees toward chest. Rotate knees to left, turning head in opposite direction until stretch is felt. Hold __10__ seconds. Relax.  Repeat to other side. Repeat __3__ times per set. Do __1__ sets per session. Do before getting out of bed in the morning.     CORE STABILIZATION EXERCISES:  Pelvic Tilt    Flatten back by tightening stomach muscles and buttocks.  Hold for 5 seconds. Repeat __10__ times per set. Do __1__ sets per session. Do __1-2__ sessions per day.   Bridging    Slowly raise buttocks from floor, keeping stomach tight.  Hold for 5 seconds. Repeat __10__ times per set. Do __1__ sets per session. Do __1-2__ sessions per day.   Strengthening: Hip Abductor - Resisted    With band looped around both legs above knees, push thighs apart. Repeat _10___ times per set. Do __1__ sets per session. Do __2-3__ sessions per day.

## 2018-02-28 ENCOUNTER — Ambulatory Visit: Payer: PPO | Admitting: Physical Therapy

## 2018-02-28 ENCOUNTER — Encounter: Payer: Self-pay | Admitting: Physical Therapy

## 2018-02-28 DIAGNOSIS — R262 Difficulty in walking, not elsewhere classified: Secondary | ICD-10-CM

## 2018-02-28 DIAGNOSIS — R2681 Unsteadiness on feet: Secondary | ICD-10-CM

## 2018-02-28 DIAGNOSIS — M6281 Muscle weakness (generalized): Secondary | ICD-10-CM

## 2018-02-28 DIAGNOSIS — R2689 Other abnormalities of gait and mobility: Secondary | ICD-10-CM | POA: Diagnosis not present

## 2018-02-28 NOTE — Therapy (Signed)
[facility] [address] [place], [place], [address] Phone: [phone]   Fax:  [phone]  Physical Therapy Treatment  Patient Details  Name: Brendan Nguyen MRN: [id number] Date of Birth: [birth year] Referring Provider: Marcial Pacas, MD   Encounter Date: 02/28/2018  PT End of Session - 02/28/18 1305    Visit Number  7    Number of Visits  16    Date for PT Re-Evaluation  04/02/18    PT Start Time  1220    PT Stop Time  1305    PT Time Calculation (min)  45 min    Equipment Utilized During Treatment  Gait belt    Activity Tolerance  Patient tolerated treatment well    Behavior During Therapy  [facility] for tasks assessed/performed       Past Medical History:  Diagnosis Date  . BPH (benign prostatic hyperplasia)   . Chronic kidney disease   . Elevated blood uric acid level   . GERD (gastroesophageal reflux disease)   . Hyperparathyroidism ([place])   . Hypertension   . TIA (transient ischemic attack) 2013    Past Surgical History:  Procedure Laterality Date  . ACHILLES TENDON REPAIR    . L5 lamenectomy     had surgery but not the complete surgery  . l5-s1 laninectomy    . left inguinal hernia      There were no vitals filed for this visit.  Subjective Assessment - 02/28/18 1220    Subjective  feels he's doing better, doing exercises in bed before getting out of bed which seems to be helping.    Patient Stated Goals  return to walking without AD, wants to ride motorcycle    Currently in Pain?  No/denies                      [facility] Adult PT Treatment/Exercise - 02/28/18 1224      Knee/Hip Exercises: Aerobic   Stepper  L2.5 x 8 min          Balance Exercises - 02/28/18 1238      OTAGO PROGRAM   Head Movements  Sitting;5 reps    Neck Movements  Sitting;5 reps    Back Extension  Standing;5 reps RW support    Trunk Movements  Standing;5 reps RW support    Ankle Movements  Sitting;10 reps    Knee Extensor  10 reps;Weight (comment) 2#    Knee Flexor  10 reps;Weight (comment) 2#    Hip ABductor  10 reps;Weight (comment) 2#    Ankle Plantorflexors  20 reps, support    Ankle Dorsiflexors  20 reps, support    Knee Bends  10 reps, support    Backwards Walking  Support    Walking and Turning Around  Assistive device        PT Education - 02/28/18 1305    Education provided  Yes    Education Details  discussed progress with wife-plan to d/c next week    Person(s) Educated  Spouse    Methods  Explanation    Comprehension  Verbalized understanding       PT Short Term Goals - 02/28/18 1306      PT SHORT TERM GOAL #1   Title  verbalize understanding of fall prevention strategies    Status  On-going    Target Date  03/05/18      PT SHORT TERM GOAL #2   Title  improve BERG  balance score to >/=37/56 for improved function    Status  On-going      PT SHORT TERM GOAL #3   Title  perform TUG and gait velocity with LTGs to be written    Baseline  3/20: to d/c next week    Status  Deferred      PT SHORT TERM GOAL #4   Title  amb >69' with LRAD with supervision for improved function and mobility    Status  On-going        PT Long Term Goals - 02/05/18 1219      PT LONG TERM GOAL #1   Title  independent with HEP    Status  New    Target Date  04/02/18      PT LONG TERM GOAL #2   Title  improve BERG to >/= 45/56 for improved balance    Status  New    Target Date  04/02/18      PT LONG TERM GOAL #3   Title  amb > 500' with LRAD on indoor/paved outdoor surfaces modified independent for improved community access    Status  New    Target Date  04/02/18      PT LONG TERM GOAL #4   Title  TUG and GV goals to follow      PT LONG TERM GOAL #5   Title  n/a      PT LONG TERM GOAL #6   Title  n/a      PT LONG TERM GOAL #7   Title  n/a            Plan - 02/28/18 1306    Clinical Impression Statement  Pt with limited carryover between sessions, and minimal  compliance with HEP.  At this time recommend d/c next week to HEP, and due to possibility of progressive diagnosis (still awaiting appt at [facility] in May) recommend establishing care needs with home health agency.  Pt's wife in agreement.    PT Treatment/Interventions  Gait training;Therapeutic activities;Therapeutic exercise;Balance training;Neuromuscular re-education;Patient/family education;Stair training;Functional mobility training;Energy conservation;Manual techniques;ADLs/Self Care Home Management;Canalith Repostioning;Vestibular    PT Next Visit Plan  check goals, plan for d/c next week (started OTAGO review 3/20)    Consulted and Agree with Plan of Care  Patient;Family member/caregiver    Family Member Consulted  wife       Patient will benefit from skilled therapeutic intervention in order to improve the following deficits and impairments:  Abnormal gait, Decreased cognition, Decreased balance, Decreased endurance, Decreased activity tolerance, Decreased knowledge of precautions, Decreased safety awareness, Difficulty walking, Decreased strength, Decreased mobility  Visit Diagnosis: Other abnormalities of gait and mobility  Muscle weakness (generalized)  Unsteadiness on feet  Difficulty in walking, not elsewhere classified     Problem List Patient Active Problem List   Diagnosis Date Noted  . Gait abnormality 01/18/2018  . Cerebellar ataxia in diseases classified elsewhere ([place]) 01/18/2018  . Abnormal gait 06/21/2017  . Mixed hyperlipidemia 03/30/2017  . Stage 3 chronic kidney disease ([place]) 03/30/2017  . Polyneuropathy 03/29/2017  . Dizzy spells 03/29/2017  . HBP (high blood pressure) 11/16/2015      Laureen Abrahams, PT, DPT 02/28/18 1:09 PM    [facility] [address] [place], [place], [address] Phone: [phone]   Fax:  [phone]  Name: Brendan Nguyen MRN: [id number] Date of Birth:  1941/12/09

## 2018-03-05 ENCOUNTER — Ambulatory Visit: Payer: PPO

## 2018-03-05 DIAGNOSIS — R600 Localized edema: Secondary | ICD-10-CM | POA: Diagnosis not present

## 2018-03-05 DIAGNOSIS — R2689 Other abnormalities of gait and mobility: Secondary | ICD-10-CM | POA: Diagnosis not present

## 2018-03-05 DIAGNOSIS — I10 Essential (primary) hypertension: Secondary | ICD-10-CM | POA: Diagnosis not present

## 2018-03-05 DIAGNOSIS — N183 Chronic kidney disease, stage 3 (moderate): Secondary | ICD-10-CM | POA: Diagnosis not present

## 2018-03-05 DIAGNOSIS — R2681 Unsteadiness on feet: Secondary | ICD-10-CM

## 2018-03-05 DIAGNOSIS — M6281 Muscle weakness (generalized): Secondary | ICD-10-CM

## 2018-03-05 NOTE — Therapy (Signed)
[facility] [address] [place], [place], [address] Phone: [phone]   Fax:  [phone]  Physical Therapy Treatment  Patient Details  Name: Brendan Nguyen MRN: [id number] Date of Birth: Dec 24, 1940 Referring Provider: Marcial Pacas, MD   Encounter Date: 03/05/2018  PT End of Session - 03/05/18 1532    Visit Number  8    Number of Visits  16    Date for PT Re-Evaluation  04/02/18    Authorization Type  6    Authorization Time Period  0    PT Start Time  1320 pt late    PT Stop Time  1405    PT Time Calculation (min)  45 min    Equipment Utilized During Treatment  -- min guard to S and in // bars    Activity Tolerance  Patient tolerated treatment well    Behavior During Therapy  [facility] for tasks assessed/performed       Past Medical History:  Diagnosis Date  . BPH (benign prostatic hyperplasia)   . Chronic kidney disease   . Elevated blood uric acid level   . GERD (gastroesophageal reflux disease)   . Hyperparathyroidism ([place])   . Hypertension   . TIA (transient ischemic attack) 2013    Past Surgical History:  Procedure Laterality Date  . ACHILLES TENDON REPAIR    . L5 lamenectomy     had surgery but not the complete surgery  . l5-s1 laninectomy    . left inguinal hernia      There were no vitals filed for this visit.  Subjective Assessment - 03/05/18 1324    Subjective  Pt reported he's moving a little slower today.     Patient is accompained by:  Family member wife    Pertinent History  Personal factors affecting rehab: PLOF, active, social support, falls risk, age, progressive symptoms     Diagnostic tests  MRI: findings are most consistent with chronic microvascular ischemia.  Negative for hemorrhage or mass.    Patient Stated Goals  return to walking without AD, wants to ride motorcycle    Currently in Pain?  No/denies                No data recorded       [facility]  Treatment/Exercise - 03/05/18 1347      Ambulation/Gait   Ambulation/Gait  Yes    Ambulation/Gait Assistance  5: Supervision;4: Min guard    Ambulation/Gait Assistance Details  Cues to stay within RW, improve stride length, heel strike and safety during stand to sit txfs with RW.    Ambulation Distance (Feet)  75 Feet x2, 20'x2    Assistive device  Rolling walker    Gait Pattern  Shuffle;Trunk flexed;Decreased hip/knee flexion - right;Decreased hip/knee flexion - left;Decreased dorsiflexion - right;Decreased dorsiflexion - left      Standardized Balance Assessment   Standardized Balance Assessment  Berg Balance Test;Timed Up and Go Test      Berg Balance Test   Sit to Stand  Able to stand  independently using hands    Standing Unsupported  Able to stand safely 2 minutes    Sitting with Back Unsupported but Feet Supported on Floor or Stool  Able to sit safely and securely 2 minutes    Stand to Sit  Sits safely with minimal use of hands    Transfers  Able to transfer with verbal cueing and /or supervision    Standing Unsupported  with Eyes Closed  Able to stand 10 seconds with supervision    Standing Ubsupported with Feet Together  Needs help to attain position but able to stand for 30 seconds with feet together    From Standing, Reach Forward with Outstretched Arm  Can reach forward >5 cm safely (2")    From Standing Position, Pick up Object from Floor  Able to pick up shoe, needs supervision    From Standing Position, Turn to Look Behind Over each Shoulder  Looks behind one side only/other side shows less weight shift    Turn 360 Degrees  Needs assistance while turning    Standing Unsupported, Alternately Place Feet on Step/Stool  Needs assistance to keep from falling or unable to try    Standing Unsupported, One Foot in [place] to take small step independently and hold 30 seconds    Standing on One Leg  Unable to try or needs assist to prevent fall    Total Score  31      Timed Up  and Go Test   TUG  Normal TUG    Normal TUG (seconds)  36.95 with RW          Balance Exercises - 03/05/18 1328      OTAGO PROGRAM   Sideways Walking  Assistive device    Tandem Stance  10 seconds, support    Tandem Walk  Support    One Leg Stand  10 seconds, support    Heel Walking  -- n/a    Toe Walk  -- n/a    Heel Toe Walking Backward  -- n/a    Sit to Stand  20 reps, one support 10 reps    Overall OTAGO Comments  Performed in // bars with S for safety and cues for technique.         PT Education - 03/05/18 1531    Education provided  Yes    Education Details  PT discussed lack of progress towards goals and d/c. Pt and wife agreeable. PT educated pt and wife on seeing MD in May at [facility] to determine next steps.     Person(s) Educated  Patient;Spouse    Methods  Explanation    Comprehension  Verbalized understanding       PT Short Term Goals - 03/05/18 1533      PT SHORT TERM GOAL #1   Title  verbalize understanding of fall prevention strategies    Status  Achieved      PT SHORT TERM GOAL #2   Title  improve BERG balance score to >/=37/56 for improved function    Status  Not Met      PT SHORT TERM GOAL #3   Title  perform TUG and gait velocity with LTGs to be written    Baseline  3/20: to d/c next week    Status  Not Met      PT SHORT TERM GOAL #4   Title  amb >150' with LRAD with supervision for improved function and mobility    Status  Not Met        PT Long Term Goals - 03/05/18 1533      PT LONG TERM GOAL #1   Title  independent with HEP    Status  Partially Met      PT LONG TERM GOAL #2   Title  improve BERG to >/= 45/56 for improved balance    Status  Not Met  PT LONG TERM GOAL #3   Title  amb > 500' with LRAD on indoor/paved outdoor surfaces modified independent for improved community access    Status  Not Met      PT LONG TERM GOAL #4   Title  TUG and GV goals to follow      PT LONG TERM GOAL #5   Title  n/a      PT LONG  TERM GOAL #6   Title  n/a      PT LONG TERM GOAL #7   Title  n/a            Plan - 03/05/18 1532    Clinical Impression Statement  Pt met STG 1, pt did not meet STGs 2-4. Pt partially met LTG 1 and did not meet LTGs 2 or 3. PT to d/c at this time as pt not consistently performing HEP at home and has made minimal progress. Please see pt d/d summary for details.     PT Treatment/Interventions  Gait training;Therapeutic activities;Therapeutic exercise;Balance training;Neuromuscular re-education;Patient/family education;Stair training;Functional mobility training;Energy conservation;Manual techniques;ADLs/Self Care Home Management;Canalith Repostioning;Vestibular    PT Next Visit Plan  check goals, plan for d/c next week (started OTAGO review 3/20)    Consulted and Agree with Plan of Care  Patient;Family member/caregiver    Family Member Consulted  wife       Patient will benefit from skilled therapeutic intervention in order to improve the following deficits and impairments:  Abnormal gait, Decreased cognition, Decreased balance, Decreased endurance, Decreased activity tolerance, Decreased knowledge of precautions, Decreased safety awareness, Difficulty walking, Decreased strength, Decreased mobility  Visit Diagnosis: Other abnormalities of gait and mobility  Muscle weakness (generalized)  Unsteadiness on feet     Problem List Patient Active Problem List   Diagnosis Date Noted  . Gait abnormality 01/18/2018  . Cerebellar ataxia in diseases classified elsewhere ([place]) 01/18/2018  . Abnormal gait 06/21/2017  . Mixed hyperlipidemia 03/30/2017  . Stage 3 chronic kidney disease ([place]) 03/30/2017  . Polyneuropathy 03/29/2017  . Dizzy spells 03/29/2017  . HBP (high blood pressure) 11/16/2015    Gethsemane Fischler L 03/05/2018, 3:34 PM  [facility] [address] [place], [place], [address] Phone: [phone]   Fax:   [phone]  Name: Brendan Nguyen MRN: [id number] Date of Birth: [birth year]  PHYSICAL THERAPY DISCHARGE SUMMARY  Visits from Start of Care: 8 Current functional level related to goals / functional outcomes:  PT Short Term Goals - 03/05/18 1533      PT SHORT TERM GOAL #1   Title  verbalize understanding of fall prevention strategies    Status  Achieved      PT SHORT TERM GOAL #2   Title  improve BERG balance score to >/=37/56 for improved function    Status  Not Met      PT SHORT TERM GOAL #3   Title  perform TUG and gait velocity with LTGs to be written    Baseline  3/20: to d/c next week    Status  Not Met      PT SHORT TERM GOAL #4   Title  amb >150' with LRAD with supervision for improved function and mobility    Status  Not Met        PT Long Term Goals - 03/05/18 1533      PT LONG TERM GOAL #1   Title  independent with HEP    Status  Partially Met  PT LONG TERM GOAL #2   Title  improve BERG to >/= 45/56 for improved balance    Status  Not Met      PT LONG TERM GOAL #3   Title  amb > 500' with LRAD on indoor/paved outdoor surfaces modified independent for improved community access    Status  Not Met      PT LONG TERM GOAL #4   Title  TUG and GV goals to follow      PT LONG TERM GOAL #5   Title  n/a      PT LONG TERM GOAL #6   Title  n/a      PT LONG TERM GOAL #7   Title  n/a         Remaining deficits: See above.   Education / Equipment: See above.  Plan: Patient agrees to discharge.  Patient goals were not met. Patient is being discharged due to lack of progress.  ?????          Geoffry Paradise, PT,DPT 03/05/18 3:35 PM Phone: [phone] Fax: [phone]

## 2018-03-07 ENCOUNTER — Ambulatory Visit: Payer: PPO | Admitting: Physical Therapy

## 2018-03-21 DIAGNOSIS — M7672 Peroneal tendinitis, left leg: Secondary | ICD-10-CM | POA: Diagnosis not present

## 2018-03-21 DIAGNOSIS — M10072 Idiopathic gout, left ankle and foot: Secondary | ICD-10-CM | POA: Diagnosis not present

## 2018-03-21 DIAGNOSIS — M66862 Spontaneous rupture of other tendons, left lower leg: Secondary | ICD-10-CM | POA: Diagnosis not present

## 2018-03-28 DIAGNOSIS — M7672 Peroneal tendinitis, left leg: Secondary | ICD-10-CM | POA: Diagnosis not present

## 2018-03-28 DIAGNOSIS — M66862 Spontaneous rupture of other tendons, left lower leg: Secondary | ICD-10-CM | POA: Diagnosis not present

## 2018-03-28 DIAGNOSIS — M10072 Idiopathic gout, left ankle and foot: Secondary | ICD-10-CM | POA: Diagnosis not present

## 2018-03-29 ENCOUNTER — Ambulatory Visit: Payer: PPO | Admitting: Neurology

## 2018-04-25 DIAGNOSIS — R4189 Other symptoms and signs involving cognitive functions and awareness: Secondary | ICD-10-CM | POA: Diagnosis not present

## 2018-04-25 DIAGNOSIS — R413 Other amnesia: Secondary | ICD-10-CM | POA: Diagnosis not present

## 2018-04-25 DIAGNOSIS — N400 Enlarged prostate without lower urinary tract symptoms: Secondary | ICD-10-CM | POA: Insufficient documentation

## 2018-04-25 DIAGNOSIS — R270 Ataxia, unspecified: Secondary | ICD-10-CM | POA: Diagnosis not present

## 2018-05-21 DIAGNOSIS — I10 Essential (primary) hypertension: Secondary | ICD-10-CM | POA: Diagnosis not present

## 2018-05-21 DIAGNOSIS — N2581 Secondary hyperparathyroidism of renal origin: Secondary | ICD-10-CM | POA: Diagnosis not present

## 2018-05-21 DIAGNOSIS — N183 Chronic kidney disease, stage 3 (moderate): Secondary | ICD-10-CM | POA: Diagnosis not present

## 2018-05-21 DIAGNOSIS — R600 Localized edema: Secondary | ICD-10-CM | POA: Diagnosis not present

## 2018-05-24 ENCOUNTER — Ambulatory Visit (INDEPENDENT_AMBULATORY_CARE_PROVIDER_SITE_OTHER): Payer: PPO | Admitting: Neurology

## 2018-05-24 ENCOUNTER — Encounter: Payer: Self-pay | Admitting: Neurology

## 2018-05-24 VITALS — BP 143/91 | HR 82 | Ht 67.0 in | Wt 204.5 lb

## 2018-05-24 DIAGNOSIS — G3281 Cerebellar ataxia in diseases classified elsewhere: Secondary | ICD-10-CM

## 2018-05-24 DIAGNOSIS — R269 Unspecified abnormalities of gait and mobility: Secondary | ICD-10-CM

## 2018-05-24 MED ORDER — CARBIDOPA-LEVODOPA 25-100 MG PO TABS: 3.0000 | ORAL_TABLET | Freq: Three times a day (TID) | ORAL | 5 refills | Status: DC
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
## 2018-07-24 DIAGNOSIS — N183 Chronic kidney disease, stage 3 (moderate): Secondary | ICD-10-CM | POA: Diagnosis not present

## 2018-07-24 DIAGNOSIS — E781 Pure hyperglyceridemia: Secondary | ICD-10-CM | POA: Diagnosis not present

## 2018-07-24 DIAGNOSIS — E559 Vitamin D deficiency, unspecified: Secondary | ICD-10-CM | POA: Diagnosis not present

## 2018-07-24 DIAGNOSIS — I10 Essential (primary) hypertension: Secondary | ICD-10-CM | POA: Diagnosis not present

## 2018-07-26 DIAGNOSIS — N183 Chronic kidney disease, stage 3 (moderate): Secondary | ICD-10-CM | POA: Diagnosis not present

## 2018-07-26 DIAGNOSIS — E781 Pure hyperglyceridemia: Secondary | ICD-10-CM | POA: Diagnosis not present

## 2018-07-26 DIAGNOSIS — Z66 Do not resuscitate: Secondary | ICD-10-CM | POA: Insufficient documentation

## 2018-07-26 DIAGNOSIS — I10 Essential (primary) hypertension: Secondary | ICD-10-CM | POA: Diagnosis not present

## 2018-07-26 DIAGNOSIS — E559 Vitamin D deficiency, unspecified: Secondary | ICD-10-CM | POA: Diagnosis not present

## 2018-07-26 DIAGNOSIS — Z0000 Encounter for general adult medical examination without abnormal findings: Secondary | ICD-10-CM | POA: Diagnosis not present

## 2018-07-26 DIAGNOSIS — E669 Obesity, unspecified: Secondary | ICD-10-CM | POA: Diagnosis not present

## 2018-09-05 DIAGNOSIS — R350 Frequency of micturition: Secondary | ICD-10-CM | POA: Diagnosis not present

## 2018-09-05 DIAGNOSIS — Z8551 Personal history of malignant neoplasm of bladder: Secondary | ICD-10-CM | POA: Diagnosis not present

## 2018-09-05 DIAGNOSIS — R351 Nocturia: Secondary | ICD-10-CM | POA: Diagnosis not present

## 2018-09-05 DIAGNOSIS — N401 Enlarged prostate with lower urinary tract symptoms: Secondary | ICD-10-CM | POA: Diagnosis not present

## 2018-09-05 DIAGNOSIS — B349 Viral infection, unspecified: Secondary | ICD-10-CM | POA: Diagnosis not present

## 2018-09-24 DIAGNOSIS — N183 Chronic kidney disease, stage 3 (moderate): Secondary | ICD-10-CM | POA: Diagnosis not present

## 2018-09-24 DIAGNOSIS — N2581 Secondary hyperparathyroidism of renal origin: Secondary | ICD-10-CM | POA: Diagnosis not present

## 2018-09-24 DIAGNOSIS — R600 Localized edema: Secondary | ICD-10-CM | POA: Diagnosis not present

## 2018-09-24 DIAGNOSIS — I10 Essential (primary) hypertension: Secondary | ICD-10-CM | POA: Diagnosis not present

## 2018-11-12 ENCOUNTER — Other Ambulatory Visit: Payer: Self-pay | Admitting: Neurology

## 2018-11-19 DIAGNOSIS — E781 Pure hyperglyceridemia: Secondary | ICD-10-CM | POA: Diagnosis not present

## 2018-11-19 DIAGNOSIS — N183 Chronic kidney disease, stage 3 (moderate): Secondary | ICD-10-CM | POA: Diagnosis not present

## 2018-11-19 DIAGNOSIS — E559 Vitamin D deficiency, unspecified: Secondary | ICD-10-CM | POA: Diagnosis not present

## 2018-11-26 ENCOUNTER — Telehealth: Payer: Self-pay | Admitting: Neurology

## 2018-11-26 ENCOUNTER — Encounter: Payer: Self-pay | Admitting: Neurology

## 2018-11-26 ENCOUNTER — Ambulatory Visit: Payer: PPO | Admitting: Neurology

## 2018-11-26 VITALS — BP 138/76 | HR 68

## 2018-11-26 DIAGNOSIS — R269 Unspecified abnormalities of gait and mobility: Secondary | ICD-10-CM | POA: Diagnosis not present
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
## 2018-11-26 NOTE — Telephone Encounter (Signed)
error 

## 2018-11-27 DIAGNOSIS — E559 Vitamin D deficiency, unspecified: Secondary | ICD-10-CM | POA: Diagnosis not present

## 2018-11-27 DIAGNOSIS — E781 Pure hyperglyceridemia: Secondary | ICD-10-CM | POA: Diagnosis not present

## 2018-11-27 DIAGNOSIS — I10 Essential (primary) hypertension: Secondary | ICD-10-CM | POA: Diagnosis not present

## 2018-11-27 DIAGNOSIS — N183 Chronic kidney disease, stage 3 (moderate): Secondary | ICD-10-CM | POA: Diagnosis not present

## 2018-11-27 DIAGNOSIS — E669 Obesity, unspecified: Secondary | ICD-10-CM | POA: Diagnosis not present

## 2019-01-07 ENCOUNTER — Telehealth: Payer: Self-pay | Admitting: Neurology

## 2019-01-07 MED ORDER — OXYBUTYNIN CHLORIDE ER 5 MG PO TB24: 5.0000 mg | ORAL_TABLET | Freq: Every day | ORAL | 11 refills | Status: AC

## 2019-01-07 MED ORDER — CARBIDOPA-LEVODOPA 25-100 MG PO TABS: ORAL_TABLET | ORAL | 11 refills | Status: DC

## 2019-01-07 NOTE — Telephone Encounter (Signed)
Returned the call to his wife.  Reports that he was completely off Sinemet for 2 weeks.  During this time, his walking declined due to weakness and tremors.  She restarted his Sinemet 25-100mg .  She has been titrating his dose by giving him 1tab TID x one week then 2 tabs TID this week.  She needs a new prescription sent to the pharmacy so he can go back up to his previous dose of Sinement 25-100mg , three tabs TID.  Also, she is requesting a refill of generic Ditropan XL 5mg , one tab QHS.  Says it works well for him.  Ok, per vo by Dr. Krista Blue, to provide refills for the above medications.

## 2019-01-07 NOTE — Telephone Encounter (Signed)
Patients wife [name] on [place] calling to let Dr Krista Blue know she weaned him off of Carbidopa Levodopa as instructed. Waited 2 weeks & has now started again. Can you please call to discuss & also can refill be given. Pharmacy is [facility] on [address] in [place].

## 2019-01-07 NOTE — Addendum Note (Signed)
Addended by: Noberto Retort C on: 01/07/2019 11:35 AM   Modules accepted: Orders

## 2019-01-24 DIAGNOSIS — I10 Essential (primary) hypertension: Secondary | ICD-10-CM | POA: Diagnosis not present

## 2019-01-24 DIAGNOSIS — N183 Chronic kidney disease, stage 3 (moderate): Secondary | ICD-10-CM | POA: Diagnosis not present

## 2019-01-24 DIAGNOSIS — R600 Localized edema: Secondary | ICD-10-CM | POA: Diagnosis not present

## 2019-01-24 DIAGNOSIS — N2581 Secondary hyperparathyroidism of renal origin: Secondary | ICD-10-CM | POA: Diagnosis not present

## 2019-01-27 IMAGING — MR MR THORACIC SPINE WO/W CM
6 of 17 series · 18 of 48 positions shown · IV contrast (multihance)
Comparison: None.

CLINICAL DATA: Chronic gait disturbance which has worsened over the
past 6 months. Right side body pain radiating into the posterior
right leg. Numbness in both legs and feet. No known injury.

Creatinine was obtained on site at [HOSPITAL] at [HOSPITAL].Results: Creatinine 1.4 mg/dL.
EXAM:
MRI CERVICAL AND THORACIC SPINE WITHOUT AND WITH CONTRAST
TECHNIQUE: Multiplanar and multiecho pulse sequences of the cervical spine, to
include the craniocervical junction and cervicothoracic junction,
and the thoracic spine, were obtained without and with intravenous
contrast.
CONTRAST:  20 ml MULTIHANCE GADOBENATE DIMEGLUMINE 529 MG/ML IV SOLN

[Series 2: T2 · sagittal · 3.0mm · 0.41mm/px · 3 of 17 slices shown (1 of 6)]
[im 1/17]
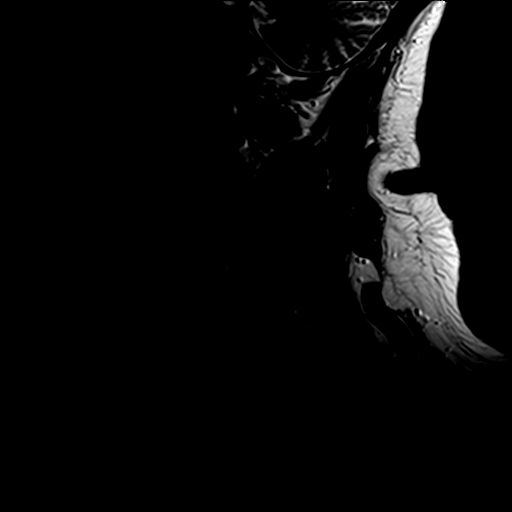
[im 9/17]
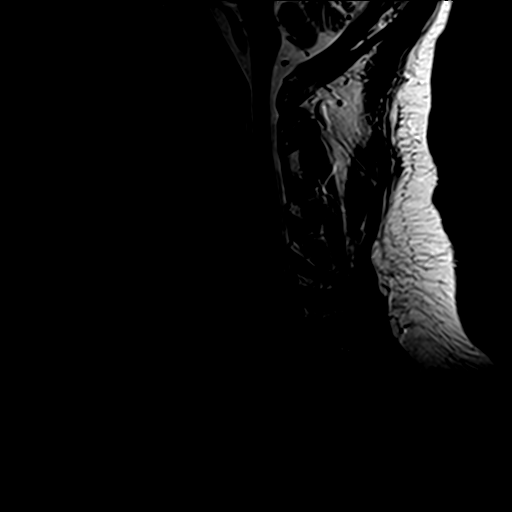
[im 17/17]
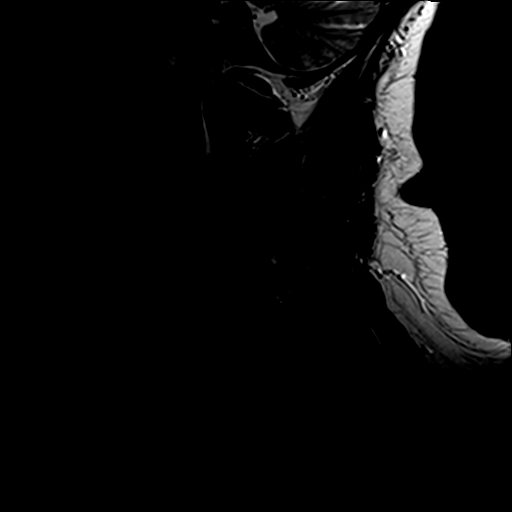

[Series 5: T2 · axial · 3.0mm · 0.39mm/px · z∈[-82,+19]mm · 3 of 28 slices shown (2 of 6)]
[im 1/28]
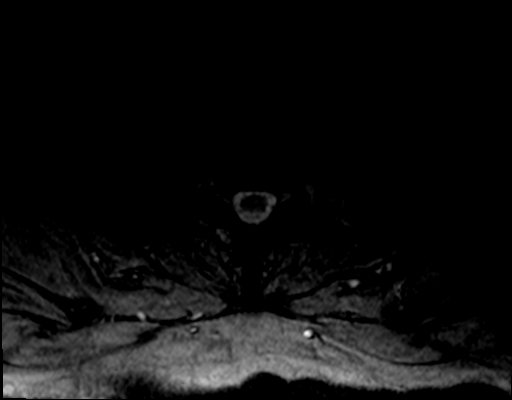
[im 14/28]
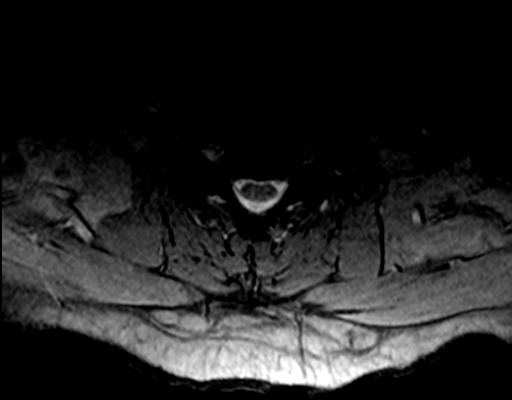
[im 28/28]
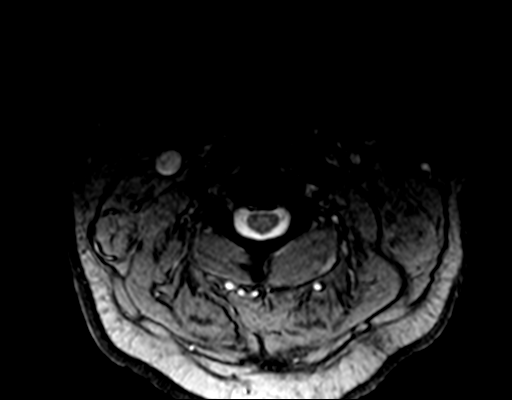

[Series 6: T2 · axial · 3.0mm · 0.39mm/px · z∈[-83,+18]mm · 3 of 28 slices shown (3 of 6)]
[im 1/28]
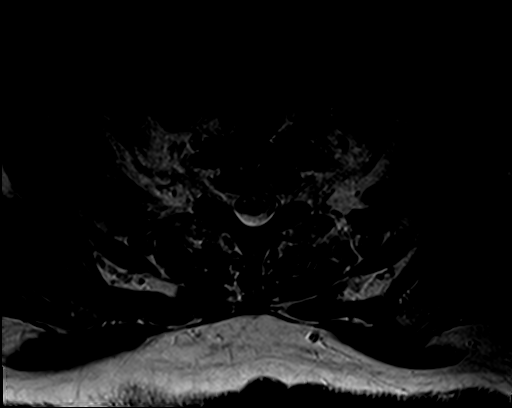
[im 14/28]
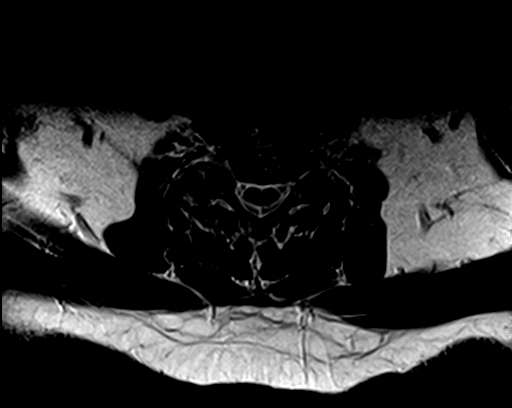
[im 28/28]
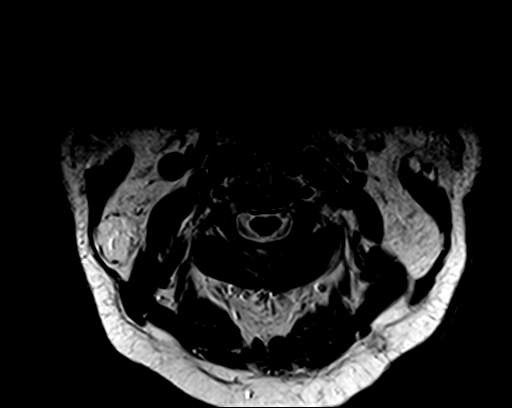

[Series 13: T2 · axial · 4.0mm · 0.39mm/px · z∈[-352,-101]mm · 4 of 36 slices shown (4 of 6)]
[im 1/36]
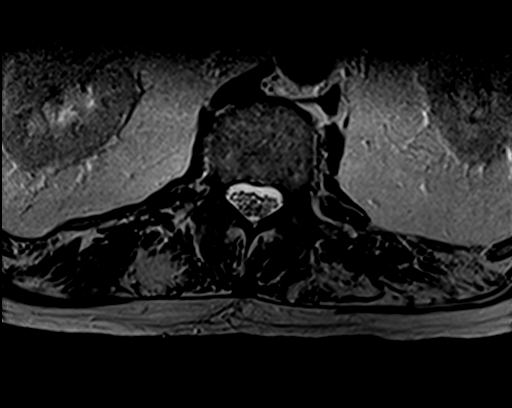
[im 12/36]
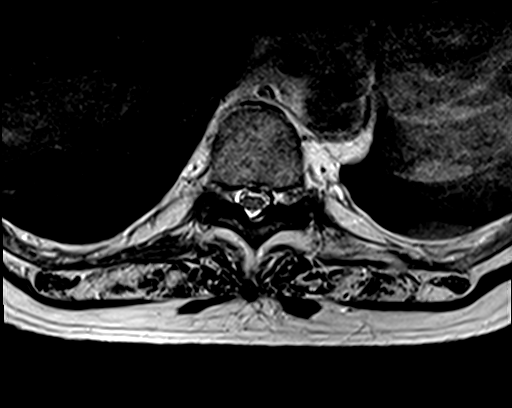
[im 24/36]
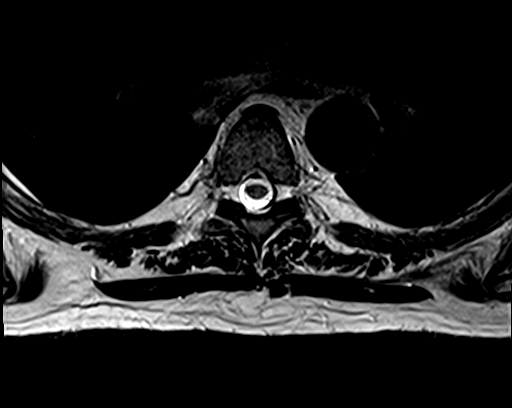
[im 36/36]
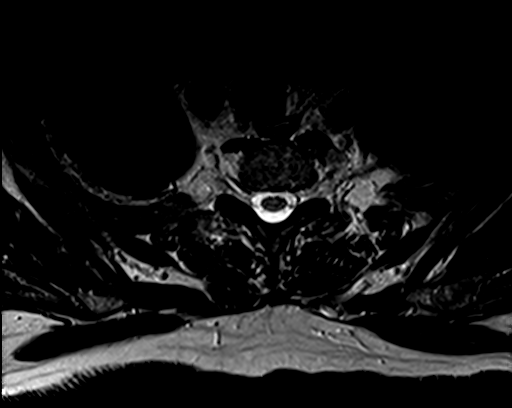

[Series 14: T2 · axial · 4.0mm · 0.39mm/px · z∈[-352,-101]mm · 4 of 36 slices shown (5 of 6)]
[im 1/36]
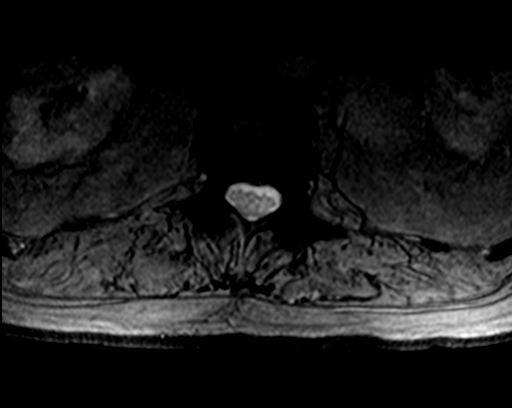
[im 12/36]
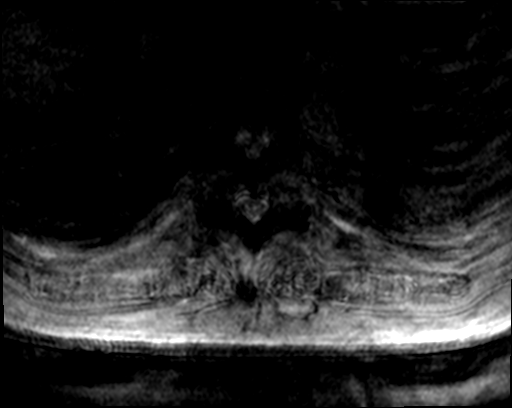
[im 24/36]
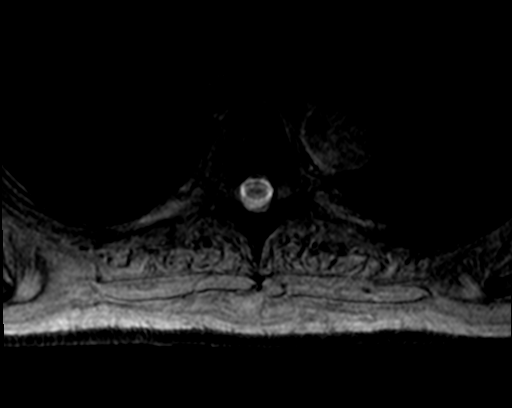
[im 36/36]
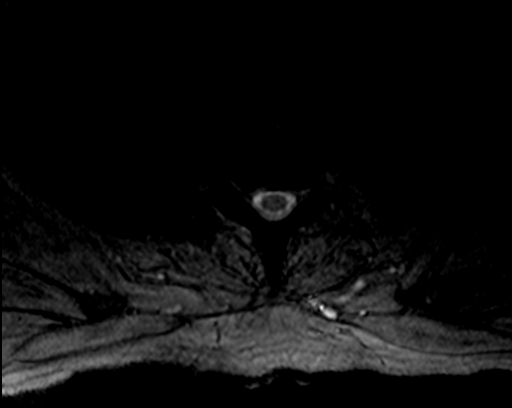

[Series 16: T2 · sagittal · 4.0mm · 0.38mm/px · 1 of 14 slices shown (6 of 6)]
[im 1/14]
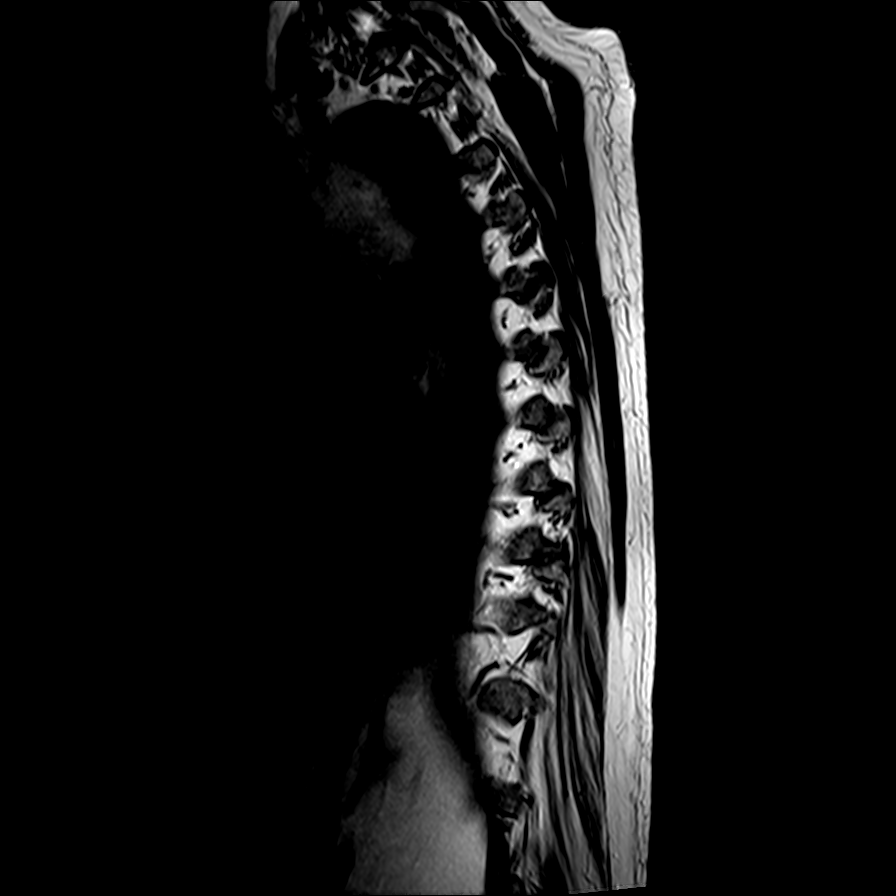

[18 of 48 positions shown; findings below may reference images not displayed]

FINDINGS: MRI CERVICAL SPINE FINDINGS

Alignment: There is reversal of the normal cervical lordosis from
C2-C5 with associated 0.3 cm retrolisthesis C3 on C4 and C4 on C5.

Vertebrae: No fracture or worrisome lesion.

Cord: Normal signal throughout. No pathologic enhancement after
contrast administration.

Posterior Fossa, vertebral arteries, paraspinal tissues: Negative.

Disc levels:

C2-3:  Shallow disc bulge without stenosis.

C3-4: Broad-based central protrusion mildly indents the ventral
cord. Uncovertebral disease causes moderately severe to severe
foraminal narrowing, worse on the right.

C4-5: Disc osteophyte complex and bilateral uncovertebral disease
are seen. There is slight deformity of the ventral cord. Severe
bilateral foraminal narrowing is present.

C5-6: Shallow disc bulge and uncovertebral disease are seen. The
central canal is open. Moderate to moderately severe foraminal
narrowing is worse on the left.

C6-7: Right worse than left uncovertebral disease and a very shallow
disc bulge. The central canal is open. Moderate to moderately severe
foraminal narrowing is worse on the right.

C7-T1: Shallow disc bulge. The central canal and foramina are widely
patent.

MRI THORACIC SPINE FINDINGS

Alignment:  Maintained.

Vertebrae: No fracture or worrisome lesion.

Cord: Normal signal throughout. No pathologic enhancement after
contrast administration.

Paraspinal and other soft tissues: Negative.

Disc levels:

T5-6: Small central disc protrusion without central canal or
foraminal stenosis.

T6-7: Shallow left paracentral/shallow central left paracentral
protrusion without stenosis.

T7-8: Small central disc protrusion effaces the ventral thecal sac
but the central canal and foramina are open.

T8-9: Shallow central protrusion indents the ventral thecal sac
without stenosis.

T9-10: Ligamentum flavum thickening, facet arthropathy and a minimal
left paracentral protrusion. The central canal and foramina remain
open.

T10-11:  Facet arthropathy.  Otherwise negative.

Except as described above, intervertebral disc spaces appear normal.
IMPRESSION: Normal appearing cervical and thoracic cord.

Central disc protrusion at C3-4 mildly indents the ventral cord.
Moderately severe to severe foraminal narrowing at C3-4 is worse on
the right.

Mild deformity of the ventral cord and severe bilateral foraminal
narrowing at C4-5 due to a disc osteophyte complex and uncovertebral
disease.

Moderate to moderately severe foraminal narrowing at C5-6 is worse
on the left. The central canal is open.

Moderate to moderately severe foraminal narrowing at C6-7 is worse
on the right. The central canal is open.

Mild thoracic spondylosis as detailed above without central canal
narrowing.

## 2019-05-17 DIAGNOSIS — E559 Vitamin D deficiency, unspecified: Secondary | ICD-10-CM | POA: Diagnosis not present

## 2019-05-17 DIAGNOSIS — E781 Pure hyperglyceridemia: Secondary | ICD-10-CM | POA: Diagnosis not present

## 2019-05-17 DIAGNOSIS — I10 Essential (primary) hypertension: Secondary | ICD-10-CM | POA: Diagnosis not present

## 2019-05-17 DIAGNOSIS — N183 Chronic kidney disease, stage 3 (moderate): Secondary | ICD-10-CM | POA: Diagnosis not present

## 2019-05-20 DIAGNOSIS — E781 Pure hyperglyceridemia: Secondary | ICD-10-CM | POA: Diagnosis not present

## 2019-05-20 DIAGNOSIS — E559 Vitamin D deficiency, unspecified: Secondary | ICD-10-CM | POA: Diagnosis not present

## 2019-05-20 DIAGNOSIS — N183 Chronic kidney disease, stage 3 (moderate): Secondary | ICD-10-CM | POA: Diagnosis not present

## 2019-05-20 DIAGNOSIS — E669 Obesity, unspecified: Secondary | ICD-10-CM | POA: Diagnosis not present

## 2019-05-20 DIAGNOSIS — Z0000 Encounter for general adult medical examination without abnormal findings: Secondary | ICD-10-CM | POA: Diagnosis not present

## 2019-05-23 ENCOUNTER — Other Ambulatory Visit: Payer: Self-pay | Admitting: *Deleted

## 2019-05-23 NOTE — Patient Outreach (Signed)
[facility] High Risk Patient Outreach. No answer, however, I was able to leave a message and request a return call. I will call again within 3 business days, if I have not heard back from them.  Brendan Nguyen. Myrtie Neither, MSN, [facility] Gerontological Nurse Practitioner [facility] [phone]

## 2019-05-27 DIAGNOSIS — R600 Localized edema: Secondary | ICD-10-CM | POA: Diagnosis not present

## 2019-05-27 DIAGNOSIS — N2581 Secondary hyperparathyroidism of renal origin: Secondary | ICD-10-CM | POA: Diagnosis not present

## 2019-05-27 DIAGNOSIS — N183 Chronic kidney disease, stage 3 (moderate): Secondary | ICD-10-CM | POA: Diagnosis not present

## 2019-05-27 DIAGNOSIS — I10 Essential (primary) hypertension: Secondary | ICD-10-CM | POA: Diagnosis not present

## 2019-05-28 ENCOUNTER — Ambulatory Visit: Payer: Self-pay | Admitting: *Deleted

## 2019-05-29 ENCOUNTER — Encounter: Payer: Self-pay | Admitting: Emergency Medicine

## 2019-05-29 ENCOUNTER — Other Ambulatory Visit: Payer: Self-pay

## 2019-05-29 ENCOUNTER — Emergency Department: Payer: PPO

## 2019-05-29 ENCOUNTER — Inpatient Hospital Stay
Admission: EM | Admit: 2019-05-29 | Discharge: 2019-06-01 | DRG: 163 | Disposition: A | Discharge status: Home Health | Payer: PPO | Attending: Internal Medicine | Admitting: Internal Medicine

## 2019-05-29 DIAGNOSIS — G309 Alzheimer's disease, unspecified: Secondary | ICD-10-CM | POA: Diagnosis present

## 2019-05-29 DIAGNOSIS — Z20828 Contact with and (suspected) exposure to other viral communicable diseases: Secondary | ICD-10-CM | POA: Diagnosis present

## 2019-05-29 DIAGNOSIS — I129 Hypertensive chronic kidney disease with stage 1 through stage 4 chronic kidney disease, or unspecified chronic kidney disease: Secondary | ICD-10-CM | POA: Diagnosis not present

## 2019-05-29 DIAGNOSIS — R918 Other nonspecific abnormal finding of lung field: Secondary | ICD-10-CM | POA: Diagnosis not present

## 2019-05-29 DIAGNOSIS — I2602 Saddle embolus of pulmonary artery with acute cor pulmonale: Secondary | ICD-10-CM | POA: Diagnosis not present

## 2019-05-29 DIAGNOSIS — Z79899 Other long term (current) drug therapy: Secondary | ICD-10-CM | POA: Diagnosis not present

## 2019-05-29 DIAGNOSIS — I10 Essential (primary) hypertension: Secondary | ICD-10-CM | POA: Diagnosis not present

## 2019-05-29 DIAGNOSIS — R51 Headache: Secondary | ICD-10-CM | POA: Diagnosis not present

## 2019-05-29 DIAGNOSIS — N400 Enlarged prostate without lower urinary tract symptoms: Secondary | ICD-10-CM | POA: Diagnosis present

## 2019-05-29 DIAGNOSIS — K760 Fatty (change of) liver, not elsewhere classified: Secondary | ICD-10-CM | POA: Diagnosis not present

## 2019-05-29 DIAGNOSIS — J189 Pneumonia, unspecified organism: Secondary | ICD-10-CM | POA: Diagnosis not present

## 2019-05-29 DIAGNOSIS — Z791 Long term (current) use of non-steroidal anti-inflammatories (NSAID): Secondary | ICD-10-CM

## 2019-05-29 DIAGNOSIS — K219 Gastro-esophageal reflux disease without esophagitis: Secondary | ICD-10-CM | POA: Diagnosis not present

## 2019-05-29 DIAGNOSIS — I82411 Acute embolism and thrombosis of right femoral vein: Secondary | ICD-10-CM | POA: Diagnosis present

## 2019-05-29 DIAGNOSIS — E782 Mixed hyperlipidemia: Secondary | ICD-10-CM | POA: Diagnosis present

## 2019-05-29 DIAGNOSIS — F0280 Dementia in other diseases classified elsewhere without behavioral disturbance: Secondary | ICD-10-CM | POA: Diagnosis present

## 2019-05-29 DIAGNOSIS — R079 Chest pain, unspecified: Secondary | ICD-10-CM | POA: Diagnosis not present

## 2019-05-29 DIAGNOSIS — I249 Acute ischemic heart disease, unspecified: Secondary | ICD-10-CM | POA: Diagnosis not present

## 2019-05-29 DIAGNOSIS — I82431 Acute embolism and thrombosis of right popliteal vein: Secondary | ICD-10-CM | POA: Diagnosis present

## 2019-05-29 DIAGNOSIS — I517 Cardiomegaly: Secondary | ICD-10-CM | POA: Diagnosis not present

## 2019-05-29 DIAGNOSIS — I2699 Other pulmonary embolism without acute cor pulmonale: Secondary | ICD-10-CM

## 2019-05-29 DIAGNOSIS — R7989 Other specified abnormal findings of blood chemistry: Secondary | ICD-10-CM | POA: Diagnosis not present

## 2019-05-29 DIAGNOSIS — E1122 Type 2 diabetes mellitus with diabetic chronic kidney disease: Secondary | ICD-10-CM | POA: Diagnosis present

## 2019-05-29 DIAGNOSIS — I2692 Saddle embolus of pulmonary artery without acute cor pulmonale: Principal | ICD-10-CM | POA: Diagnosis present

## 2019-05-29 DIAGNOSIS — E785 Hyperlipidemia, unspecified: Secondary | ICD-10-CM | POA: Diagnosis not present

## 2019-05-29 DIAGNOSIS — Z8673 Personal history of transient ischemic attack (TIA), and cerebral infarction without residual deficits: Secondary | ICD-10-CM

## 2019-05-29 DIAGNOSIS — J9601 Acute respiratory failure with hypoxia: Secondary | ICD-10-CM | POA: Diagnosis not present

## 2019-05-29 DIAGNOSIS — N2581 Secondary hyperparathyroidism of renal origin: Secondary | ICD-10-CM | POA: Diagnosis present

## 2019-05-29 DIAGNOSIS — R52 Pain, unspecified: Secondary | ICD-10-CM

## 2019-05-29 DIAGNOSIS — J9811 Atelectasis: Secondary | ICD-10-CM | POA: Diagnosis not present

## 2019-05-29 DIAGNOSIS — J181 Lobar pneumonia, unspecified organism: Secondary | ICD-10-CM | POA: Diagnosis not present

## 2019-05-29 DIAGNOSIS — K8020 Calculus of gallbladder without cholecystitis without obstruction: Secondary | ICD-10-CM | POA: Diagnosis present

## 2019-05-29 DIAGNOSIS — N183 Chronic kidney disease, stage 3 (moderate): Secondary | ICD-10-CM | POA: Diagnosis not present

## 2019-05-29 DIAGNOSIS — Z7982 Long term (current) use of aspirin: Secondary | ICD-10-CM

## 2019-05-29 DIAGNOSIS — R9431 Abnormal electrocardiogram [ECG] [EKG]: Secondary | ICD-10-CM | POA: Diagnosis not present

## 2019-05-29 DIAGNOSIS — I824Z1 Acute embolism and thrombosis of unspecified deep veins of right distal lower extremity: Secondary | ICD-10-CM | POA: Diagnosis not present

## 2019-05-29 DIAGNOSIS — Z66 Do not resuscitate: Secondary | ICD-10-CM | POA: Diagnosis not present

## 2019-05-29 DIAGNOSIS — R0602 Shortness of breath: Secondary | ICD-10-CM | POA: Diagnosis not present

## 2019-05-29 DIAGNOSIS — M549 Dorsalgia, unspecified: Secondary | ICD-10-CM | POA: Diagnosis not present

## 2019-05-29 DIAGNOSIS — M546 Pain in thoracic spine: Secondary | ICD-10-CM | POA: Diagnosis not present

## 2019-05-29 DIAGNOSIS — I248 Other forms of acute ischemic heart disease: Secondary | ICD-10-CM | POA: Diagnosis not present

## 2019-05-29 DIAGNOSIS — J90 Pleural effusion, not elsewhere classified: Secondary | ICD-10-CM | POA: Diagnosis not present

## 2019-05-29 DIAGNOSIS — E1142 Type 2 diabetes mellitus with diabetic polyneuropathy: Secondary | ICD-10-CM | POA: Diagnosis present

## 2019-05-29 DIAGNOSIS — I82401 Acute embolism and thrombosis of unspecified deep veins of right lower extremity: Secondary | ICD-10-CM | POA: Diagnosis not present

## 2019-05-29 DIAGNOSIS — J9690 Respiratory failure, unspecified, unspecified whether with hypoxia or hypercapnia: Secondary | ICD-10-CM | POA: Diagnosis not present

## 2019-05-29 DIAGNOSIS — M545 Low back pain: Secondary | ICD-10-CM | POA: Diagnosis present

## 2019-05-29 DIAGNOSIS — G8929 Other chronic pain: Secondary | ICD-10-CM | POA: Diagnosis not present

## 2019-05-29 DIAGNOSIS — R531 Weakness: Secondary | ICD-10-CM | POA: Diagnosis not present

## 2019-05-29 HISTORY — DX: Pure hyperglyceridemia: E78.1

## 2019-05-29 HISTORY — DX: Personal history of other medical treatment: Z92.89

## 2019-05-29 HISTORY — DX: Chronic kidney disease, stage 3 unspecified: N18.30

## 2019-05-29 HISTORY — DX: Mixed hyperlipidemia: E78.2

## 2019-05-29 LAB — LACTIC ACID, PLASMA
Lactic Acid, Venous: 1.9 mmol/L (ref 0.5–1.9)
Lactic Acid, Venous: 1.4 mmol/L (ref 0.5–1.9)

## 2019-05-29 LAB — APTT: aPTT: 31 seconds (ref 24–36)

## 2019-05-29 LAB — BRAIN NATRIURETIC PEPTIDE: B Natriuretic Peptide: 716 pg/mL — ABNORMAL HIGH (ref 0.0–100.0)

## 2019-05-29 LAB — BASIC METABOLIC PANEL
Anion gap: 11 (ref 5–15)
BUN: 34 mg/dL — ABNORMAL HIGH (ref 8–23)
CO2: 23 mmol/L (ref 22–32)
Calcium: 9.8 mg/dL (ref 8.9–10.3)
Chloride: 104 mmol/L (ref 98–111)
Creatinine, Ser: 1.7 mg/dL — ABNORMAL HIGH (ref 0.61–1.24)
GFR calc Af Amer: 43 mL/min — ABNORMAL LOW (ref >60)
GFR calc non Af Amer: 38 mL/min — ABNORMAL LOW (ref >60)
Glucose, Bld: 144 mg/dL — ABNORMAL HIGH (ref 70–99)
Potassium: 4.4 mmol/L (ref 3.5–5.1)
Sodium: 138 mmol/L (ref 135–145)

## 2019-05-29 LAB — CBC
HCT: 49.2 % (ref 39.0–52.0)
Hemoglobin: 16.8 g/dL (ref 13.0–17.0)
MCH: 32 pg (ref 26.0–34.0)
MCHC: 34.1 g/dL (ref 30.0–36.0)
MCV: 93.7 fL (ref 80.0–100.0)
Platelets: 256 10*3/uL (ref 150–400)
RBC: 5.25 MIL/uL (ref 4.22–5.81)
RDW: 13 % (ref 11.5–15.5)
WBC: 15.3 10*3/uL — ABNORMAL HIGH (ref 4.0–10.5)
nRBC: 0 % (ref 0.0–0.2)

## 2019-05-29 LAB — TROPONIN I
Troponin I: 0.06 ng/mL (ref <0.03)
Troponin I: 0.08 ng/mL (ref <0.03)

## 2019-05-29 LAB — PROCALCITONIN: Procalcitonin: <0.1 ng/mL

## 2019-05-29 LAB — PROTIME-INR
INR: 1.2 (ref 0.8–1.2)
Prothrombin Time: 15 seconds (ref 11.4–15.2)

## 2019-05-29 LAB — HEPATIC FUNCTION PANEL
ALT: 10 U/L (ref 0–44)
AST: 18 U/L (ref 15–41)
Albumin: 4 g/dL (ref 3.5–5.0)
Alkaline Phosphatase: 39 U/L (ref 38–126)
Bilirubin, Direct: 0.2 mg/dL (ref 0.0–0.2)
Indirect Bilirubin: 0.8 mg/dL (ref 0.3–0.9)
Total Bilirubin: 1 mg/dL (ref 0.3–1.2)
Total Protein: 7.1 g/dL (ref 6.5–8.1)

## 2019-05-29 LAB — SARS CORONAVIRUS 2 BY RT PCR (HOSPITAL ORDER, PERFORMED IN ~~LOC~~ HOSPITAL LAB): SARS Coronavirus 2: NEGATIVE

## 2019-05-29 MED ORDER — SODIUM CHLORIDE 0.9 % IV SOLN
2.0000 g | INTRAVENOUS | Status: DC
  Administered 2019-05-30 – 2019-05-31 (×2): 2 g via INTRAVENOUS
  Filled 2019-05-29 (×2): qty 2
  Filled 2019-05-29: qty 20

## 2019-05-29 MED ORDER — ONDANSETRON HCL 4 MG/2ML IJ SOLN: 4.0000 mg | Freq: Four times a day (QID) | INTRAMUSCULAR | Status: DC | PRN

## 2019-05-29 MED ORDER — ATORVASTATIN CALCIUM 20 MG PO TABS
40.0000 mg | ORAL_TABLET | Freq: Every day | ORAL | Status: DC
  Administered 2019-05-30 – 2019-06-01 (×3): 40 mg via ORAL
  Filled 2019-05-29 (×3): qty 2

## 2019-05-29 MED ORDER — SODIUM CHLORIDE 0.9% FLUSH: 3.0000 mL | Freq: Once | INTRAVENOUS | Status: DC

## 2019-05-29 MED ORDER — VITAMIN D 25 MCG (1000 UNIT) PO TABS
500.0000 [IU] | ORAL_TABLET | Freq: Every day | ORAL | Status: DC
  Administered 2019-05-30 – 2019-06-01 (×3): 500 [IU] via ORAL
  Filled 2019-05-29 (×3): qty 1

## 2019-05-29 MED ORDER — HEPARIN BOLUS VIA INFUSION
4000.0000 [IU] | Freq: Once | INTRAVENOUS | Status: AC
  Administered 2019-05-29: 4000 [IU] via INTRAVENOUS
  Filled 2019-05-29: qty 4000

## 2019-05-29 MED ORDER — SODIUM CHLORIDE 0.9 % IV SOLN: 250.0000 mL | INTRAVENOUS | Status: DC | PRN

## 2019-05-29 MED ORDER — ASPIRIN 81 MG PO CHEW
324.0000 mg | CHEWABLE_TABLET | Freq: Once | ORAL | Status: AC
  Administered 2019-05-29: 324 mg via ORAL
  Filled 2019-05-29: qty 4

## 2019-05-29 MED ORDER — SODIUM CHLORIDE 0.9% FLUSH
3.0000 mL | Freq: Two times a day (BID) | INTRAVENOUS | Status: DC
  Administered 2019-05-30 (×2): 3 mL via INTRAVENOUS

## 2019-05-29 MED ORDER — ASPIRIN 300 MG RE SUPP: 300.0000 mg | RECTAL | Status: DC

## 2019-05-29 MED ORDER — SODIUM CHLORIDE 0.9 % IV SOLN
500.0000 mg | INTRAVENOUS | Status: DC
  Administered 2019-05-30 – 2019-05-31 (×2): 500 mg via INTRAVENOUS
  Filled 2019-05-29 (×3): qty 500

## 2019-05-29 MED ORDER — FENOFIBRATE 160 MG PO TABS
160.0000 mg | ORAL_TABLET | Freq: Every day | ORAL | Status: DC
  Administered 2019-05-30 – 2019-06-01 (×3): 160 mg via ORAL
  Filled 2019-05-29 (×3): qty 1

## 2019-05-29 MED ORDER — NITROGLYCERIN 0.4 MG SL SUBL: 0.4000 mg | SUBLINGUAL_TABLET | SUBLINGUAL | Status: DC | PRN

## 2019-05-29 MED ORDER — SODIUM CHLORIDE 0.9% FLUSH: 3.0000 mL | INTRAVENOUS | Status: DC | PRN

## 2019-05-29 MED ORDER — ACETAMINOPHEN 325 MG PO TABS
650.0000 mg | ORAL_TABLET | ORAL | Status: DC | PRN
  Administered 2019-05-30 – 2019-05-31 (×3): 650 mg via ORAL
  Filled 2019-05-29 (×3): qty 2

## 2019-05-29 MED ORDER — SODIUM CHLORIDE 0.9 % IV SOLN
2.0000 g | INTRAVENOUS | Status: DC
  Administered 2019-05-29: 2 g via INTRAVENOUS
  Filled 2019-05-29: qty 20

## 2019-05-29 MED ORDER — OXYBUTYNIN CHLORIDE ER 5 MG PO TB24
5.0000 mg | ORAL_TABLET | Freq: Every day | ORAL | Status: DC
  Administered 2019-05-30 – 2019-06-01 (×3): 5 mg via ORAL
  Filled 2019-05-29 (×3): qty 1

## 2019-05-29 MED ORDER — HEPARIN (PORCINE) 25000 UT/250ML-% IV SOLN
1200.0000 [IU]/h | INTRAVENOUS | Status: DC
  Administered 2019-05-29: 1050 [IU]/h via INTRAVENOUS
  Filled 2019-05-29: qty 250

## 2019-05-29 MED ORDER — ASPIRIN EC 81 MG PO TBEC
81.0000 mg | DELAYED_RELEASE_TABLET | Freq: Every day | ORAL | Status: DC
  Administered 2019-05-30 – 2019-06-01 (×3): 81 mg via ORAL
  Filled 2019-05-29 (×3): qty 1

## 2019-05-29 MED ORDER — SODIUM CHLORIDE 0.9 % IV SOLN
500.0000 mg | INTRAVENOUS | Status: DC
  Administered 2019-05-29: 500 mg via INTRAVENOUS
  Filled 2019-05-29: qty 500

## 2019-05-29 MED ORDER — ASPIRIN 81 MG PO CHEW: 324.0000 mg | CHEWABLE_TABLET | ORAL | Status: DC

## 2019-05-29 MED ORDER — CARBIDOPA-LEVODOPA 25-100 MG PO TABS
2.0000 | ORAL_TABLET | Freq: Three times a day (TID) | ORAL | Status: DC
  Administered 2019-05-30 – 2019-06-01 (×7): 2 via ORAL
  Filled 2019-05-29 (×10): qty 2

## 2019-05-29 NOTE — ED Notes (Signed)
Pt placed on 3L O2

## 2019-05-29 NOTE — H&P (Signed)
[facility] NAME: Riggins Cisek    MR#:  [id number]  DATE OF BIRTH:  1940/12/22  DATE OF ADMISSION:  05/29/2019  PRIMARY CARE PHYSICIAN: Dion Body, MD   REQUESTING/REFERRING PHYSICIAN: Merlyn Lot, MD  CHIEF COMPLAINT:   Chief Complaint  Patient presents with  . Back Pain    HISTORY OF PRESENT ILLNESS:  Brendan Nguyen  is a 78 y.o. male with a known history of TIA, hypertension, CKD, GERD.  He presented to the emergency room complaining of intermittent chest pain which radiates through to his back from his midsternal chest described as dull/aching pain with a pain score 8-10 out of 10.  Patient had a initial episode of chest pain when leaving his nephrologist office on Monday with associated shortness of breath, nausea, diaphoresis.  Over the last 3 days he has continued to experience intermittent chest pain with increasing weakness.  He had noted no fevers at home.  However patient was febrile on his arrival to the emergency room.  He has experienced nausea.  However, no vomiting.  Patient is having cough productive of green mucus with streaks of bright red blood at times.  On review of vital signs, patient was slightly hypoxic on his arrival with oxygen saturation 88% on room air.  Currently patient is 100% on 2 L oxygen per nasal cannula.  Abnormal labs on arrival included troponin of 0.06, WBC 15.3, BUN 34, creatinine 1.70.  Chest x-ray demonstrated lateral right lung base opacity.  He has been admitted to the hospitalist service for right lower lobe pneumonia and acute coronary syndrome.  PAST MEDICAL HISTORY:   Past Medical History:  Diagnosis Date  . BPH (benign prostatic hyperplasia)   . Chronic kidney disease   . Elevated blood uric acid level   . GERD (gastroesophageal reflux disease)   . Hyperparathyroidism ([place])   . Hypertension   . TIA (transient ischemic attack) 2013    PAST SURGICAL HISTORY:   Past  Surgical History:  Procedure Laterality Date  . ACHILLES TENDON REPAIR    . L5 lamenectomy     had surgery but not the complete surgery  . l5-s1 laninectomy    . left inguinal hernia      SOCIAL HISTORY:   Social History   Tobacco Use  . Smoking status: Never Smoker  . Smokeless tobacco: Never Used  Substance Use Topics  . Alcohol use: No    Alcohol/week: 0.0 standard drinks    FAMILY HISTORY:  No family history on file.  DRUG ALLERGIES:  No Known Allergies  REVIEW OF SYSTEMS:   Review of Systems  Constitutional: Positive for chills, diaphoresis, fever and malaise/fatigue. Negative for weight loss.  HENT: Positive for congestion. Negative for sinus pain and sore throat.   Eyes: Negative for blurred vision, double vision and pain.  Respiratory: Positive for cough, sputum production, shortness of breath and wheezing. Negative for stridor.   Cardiovascular: Positive for chest pain. Negative for palpitations, orthopnea and leg swelling.  Gastrointestinal: Positive for nausea. Negative for abdominal pain, blood in stool, constipation, diarrhea, heartburn and vomiting.  Genitourinary: Negative for dysuria, flank pain, frequency and hematuria.  Musculoskeletal: Negative for back pain, falls and myalgias.  Skin: Negative for itching and rash.  Neurological: Positive for weakness. Negative for dizziness, focal weakness, seizures, loss of consciousness and headaches.  Psychiatric/Behavioral: Negative.       MEDICATIONS AT HOME:   Prior to Admission medications  Medication Sig Start Date End Date Taking? Authorizing Provider  carbidopa-levodopa (SINEMET IR) 25-100 MG tablet TAKE 3 TABLETS BY MOUTH 3 TIMES DAILY 01/07/19  Yes Marcial Pacas, MD  fenofibrate 160 MG tablet Take 160 mg by mouth daily.  03/09/17  Yes [provider]  oxybutynin (DITROPAN-XL) 5 MG 24 hr tablet Take 1 tablet (5 mg total) by mouth daily. 01/07/19  Yes Marcial Pacas, MD  VITAMIN D PO Take 400 Units by  mouth daily.   Yes [provider]      VITAL SIGNS:  Blood pressure 119/84, pulse 98, temperature (!) 101.5 F (38.6 C), temperature source Rectal, resp. rate (!) 22, height 5\' 8"  (1.727 m), weight 90.7 kg, SpO2 92 %.  PHYSICAL EXAMINATION:  Physical Exam Constitutional:      Appearance: Normal appearance.  HENT:     Head: Normocephalic and atraumatic.     Right Ear: External ear normal.     Left Ear: External ear normal.     Nose: Nose normal.     Mouth/Throat:     Mouth: Mucous membranes are moist.     Pharynx: Oropharynx is clear.  Eyes:     General: No scleral icterus.    Extraocular Movements: Extraocular movements intact.     Conjunctiva/sclera: Conjunctivae normal.     Pupils: Pupils are equal, round, and reactive to light.  Neck:     Musculoskeletal: Normal range of motion and neck supple.  Cardiovascular:     Rate and Rhythm: Normal rate and regular rhythm.     Heart sounds: No murmur. No friction rub. No gallop.   Pulmonary:     Effort: Pulmonary effort is normal.     Breath sounds: Normal breath sounds. No wheezing, rhonchi or rales.  Abdominal:     General: Bowel sounds are normal. There is no distension.     Palpations: Abdomen is soft.     Tenderness: There is no abdominal tenderness. There is no right CVA tenderness or left CVA tenderness.  Musculoskeletal: Normal range of motion.        General: No swelling or tenderness.  Skin:    General: Skin is warm and dry.     Capillary Refill: Capillary refill takes less than 2 seconds.     Coloration: Skin is not jaundiced.     Findings: No rash.  Neurological:     General: No focal deficit present.     Mental Status: He is alert and oriented to person, place, and time.  Psychiatric:        Mood and Affect: Mood normal.        Behavior: Behavior normal.    SKIN: No obvious rash, lesion, or ulcer.   LABORATORY PANEL:   CBC Recent Labs  Lab 05/29/19 1600  WBC 15.3*  HGB 16.8  HCT 49.2  PLT  256   ------------------------------------------------------------------------------------------------------------------  Chemistries  Recent Labs  Lab 05/29/19 1600  NA 138  K 4.4  CL 104  CO2 23  GLUCOSE 144*  BUN 34*  CREATININE 1.70*  CALCIUM 9.8  AST 18  ALT 10  ALKPHOS 39  BILITOT 1.0   ------------------------------------------------------------------------------------------------------------------  Cardiac Enzymes Recent Labs  Lab 05/29/19 1600  TROPONINI 0.06*   ------------------------------------------------------------------------------------------------------------------  RADIOLOGY:  Dg Chest Portable 1 View  Result Date: 05/29/2019 CLINICAL DATA:  Low back pain.  Shortness of breath. EXAM: PORTABLE CHEST 1 VIEW COMPARISON:  None. FINDINGS: Mild opacity in the lateral right lung base. The heart, hila, mediastinum, lungs,  and pleura are otherwise normal. IMPRESSION: Mild opacity in the lateral right lung base may represent infiltrate or atelectasis. Recommend short-term follow-up to ensure resolution. Electronically Signed   By: Dorise Bullion III M.D   On: 05/29/2019 16:45      IMPRESSION AND PLAN:   1.  Acute coronary syndrome - Continue to trend troponin levels - Echocardiogram - Cardiology consulted for further evaluation and recommendations - Telemetry monitoring - Heparin infusion initiated in the emergency room and has been continued - Statin therapy initiated -Lipid panel pending  2. right lower lobe pneumonia - Treatment for Community acquired pneumonia with IV azithromycin and Rocephin initiated. -O2 at 2 L per nasal cannula - DuoNeb every 6 hours as needed - Urine culture pending  3. diabetes mellitus - Moderate sliding scale insulin - Hemoglobin A1c in the a.m.  4.  Hypertension - We will treat persistent hypertension expectantly - Telemetry monitoring  5.  GERD - PPI therapy initiated  6.  CKD - BUN 34 with creatinine 1.70  - Repeat BMP in the a.m. and continue to monitor renal function closely  DVT and PPI prophylaxis initiated   All the records are reviewed and case discussed with ED provider. The plan of care was discussed in details with the patient (and family). I answered all questions. The patient agreed to proceed with the above mentioned plan. Further management will depend upon hospital course.   CODE STATUS: Full code  TOTAL TIME TAKING CARE OF THIS PATIENT: 45 minutes.    [place] on 05/29/2019 at 8:35 PM  Pager - [phone]  After 6pm go to [email or web address] - Proofreader  [facility]  Office  [phone]  CC: Primary care physician; Dion Body, MD   Note: This dictation was prepared with Dragon dictation along with smaller phrase technology. Any transcriptional errors that result from this process are unintentional.

## 2019-05-29 NOTE — ED Notes (Signed)
ED TO INPATIENT HANDOFF REPORT  ED Nurse Name and Phone #: Tessie Fass RN [phone]  S Name/Age/Gender Brendan Nguyen 79 y.o. male Room/Bed: ED06A/ED06A  Code Status   Code Status: Full Code  Home/SNF/Other Home Patient oriented to: self, place, time and situation Is this baseline? Yes   Triage Complete: Triage complete  Chief Complaint Back Pain  Triage Note Pt here with lower back pain, wife states he had an episode of shob, and diaphoresis on Monday. Some episodes of confusion which isn't new. Pt appears in NAD, answers questions appropriately at this time. In lobby, pt was unable to understand verbal instructions of moving from his wheelchair from home to [place] wheelchair. Pt was weak.    Allergies No Known Allergies  Level of Care/Admitting Diagnosis ED Disposition    ED Disposition Condition [facility]: [place] [[id number]]  Level of Care: Telemetry [5]  Covid Evaluation: Confirmed COVID Negative  Diagnosis: ACS (acute coronary syndrome) [facility]) [[id number]]  Admitting Physician: Christel Mormon [[id number]]  Attending Physician: Christel Mormon [[id number]]  Estimated length of stay: past midnight tomorrow  Certification:: I certify this patient will need inpatient services for at least 2 midnights  PT Class (Do Not Modify): Inpatient [101]  PT Acc Code (Do Not Modify): Private [1]       B Medical/Surgery History Past Medical History:  Diagnosis Date  . BPH (benign prostatic hyperplasia)   . Chronic kidney disease   . Elevated blood uric acid level   . GERD (gastroesophageal reflux disease)   . Hyperparathyroidism ([place])   . Hypertension   . TIA (transient ischemic attack) 2013   Past Surgical History:  Procedure Laterality Date  . ACHILLES TENDON REPAIR    . L5 lamenectomy     had surgery but not the complete surgery  . l5-s1 laninectomy    . left inguinal hernia       A IV Location/Drains/Wounds Patient Lines/Drains/Airways  Status   Active Line/Drains/Airways    Name:   Placement date:   Placement time:   Site:   Days:   Peripheral IV 05/29/19 Right Hand   05/29/19    1607    Hand   less than 1   Peripheral IV 05/29/19 Left Antecubital   05/29/19    1624    Antecubital   less than 1          Intake/Output Last 24 hours  Intake/Output Summary (Last 24 hours) at 05/29/2019 2323 Last data filed at 05/29/2019 2042 Gross per 24 hour  Intake 250 ml  Output -  Net 250 ml    Labs/Imaging Results for orders placed or performed during the hospital encounter of 05/29/19 (from the past 48 hour(s))  Basic metabolic panel     Status: Abnormal   Collection Time: 05/29/19  4:00 PM  Result Value Ref Range   Sodium 138 135 - 145 mmol/L   Potassium 4.4 3.5 - 5.1 mmol/L   Chloride 104 98 - 111 mmol/L   CO2 23 22 - 32 mmol/L   Glucose, Bld 144 (H) 70 - 99 mg/dL   BUN 34 (H) 8 - 23 mg/dL   Creatinine, Ser 1.70 (H) 0.61 - 1.24 mg/dL   Calcium 9.8 8.9 - 10.3 mg/dL   GFR calc non Af Amer 38 (L) >60 mL/min   GFR calc Af Amer 43 (L) >60 mL/min   Anion gap 11 5 - 15    Comment: Performed at  [facility], [address] [place], [place] [address]  CBC     Status: Abnormal   Collection Time: 05/29/19  4:00 PM  Result Value Ref Range   WBC 15.3 (H) 4.0 - 10.5 K/uL   RBC 5.25 4.22 - 5.81 MIL/uL   Hemoglobin 16.8 13.0 - 17.0 g/dL   HCT 49.2 39.0 - 52.0 %   MCV 93.7 80.0 - 100.0 fL   MCH 32.0 26.0 - 34.0 pg   MCHC 34.1 30.0 - 36.0 g/dL   RDW 13.0 11.5 - 15.5 %   Platelets 256 150 - 400 K/uL   nRBC 0.0 0.0 - 0.2 %    Comment: Performed at [facility], [address] [place], [place] [address]  Troponin I - ONCE - STAT     Status: Abnormal   Collection Time: 05/29/19  4:00 PM  Result Value Ref Range   Troponin I 0.06 (HH) <0.03 ng/mL    Comment: CRITICAL RESULT CALLED TO, READ BACK BY AND VERIFIED WITH ARIEL WALLACE @1630  05/29/19 MJU Performed at [facility], [address]  [place], [place] [address]   Hepatic function panel     Status: None   Collection Time: 05/29/19  4:00 PM  Result Value Ref Range   Total Protein 7.1 6.5 - 8.1 g/dL   Albumin 4.0 3.5 - 5.0 g/dL   AST 18 15 - 41 U/L   ALT 10 0 - 44 U/L   Alkaline Phosphatase 39 38 - 126 U/L   Total Bilirubin 1.0 0.3 - 1.2 mg/dL   Bilirubin, Direct 0.2 0.0 - 0.2 mg/dL   Indirect Bilirubin 0.8 0.3 - 0.9 mg/dL    Comment: Performed at [facility], [address] [place], [place] [address]  Brain natriuretic peptide     Status: Abnormal   Collection Time: 05/29/19  4:00 PM  Result Value Ref Range   B Natriuretic Peptide 716.0 (H) 0.0 - 100.0 pg/mL    Comment: Performed at [facility], [address] [place], [place] [address]  Procalcitonin     Status: None   Collection Time: 05/29/19  4:00 PM  Result Value Ref Range   Procalcitonin <0.10 ng/mL    Comment:        Interpretation: PCT (Procalcitonin) <= 0.5 ng/mL: Systemic infection (sepsis) is not likely. Local bacterial infection is possible. (NOTE)       Sepsis PCT Algorithm           Lower Respiratory Tract                                      Infection PCT Algorithm    ----------------------------     ----------------------------         PCT < 0.25 ng/mL                PCT < 0.10 ng/mL         Strongly encourage             Strongly discourage   discontinuation of antibiotics    initiation of antibiotics    ----------------------------     -----------------------------       PCT 0.25 - 0.50 ng/mL            PCT 0.10 - 0.25 ng/mL               OR       >  80% decrease in PCT            Discourage initiation of                                            antibiotics      Encourage discontinuation           of antibiotics    ----------------------------     -----------------------------         PCT >= 0.50 ng/mL              PCT 0.26 - 0.50 ng/mL               AND        <80% decrease in PCT             Encourage initiation  of                                             antibiotics       Encourage continuation           of antibiotics    ----------------------------     -----------------------------        PCT >= 0.50 ng/mL                  PCT > 0.50 ng/mL               AND         increase in PCT                  Strongly encourage                                      initiation of antibiotics    Strongly encourage escalation           of antibiotics                                     -----------------------------                                           PCT <= 0.25 ng/mL                                                 OR                                        > 80% decrease in PCT                                     Discontinue / Do not initiate  antibiotics Performed at [facility], [address] [place], [place] [address]   SARS Coronavirus 2 (CEPHEID- Performed in [facility] hospital lab), Hosp Order     Status: None   Collection Time: 05/29/19  4:23 PM   Specimen: Nasopharyngeal Swab  Result Value Ref Range   SARS Coronavirus 2 NEGATIVE NEGATIVE    Comment: (NOTE) If result is NEGATIVE SARS-CoV-2 target nucleic acids are NOT DETECTED. The SARS-CoV-2 RNA is generally detectable in upper and lower  respiratory specimens during the acute phase of infection. The lowest  concentration of SARS-CoV-2 viral copies this assay can detect is 250  copies / mL. A negative result does not preclude SARS-CoV-2 infection  and should not be used as the sole basis for treatment or other  patient management decisions.  A negative result may occur with  improper specimen collection / handling, submission of specimen other  than nasopharyngeal swab, presence of viral mutation(s) within the  areas targeted by this assay, and inadequate number of viral copies  (<250 copies / mL). A negative result must be combined with clinical  observations, patient  history, and epidemiological information. If result is POSITIVE SARS-CoV-2 target nucleic acids are DETECTED. The SARS-CoV-2 RNA is generally detectable in upper and lower  respiratory specimens dur ing the acute phase of infection.  Positive  results are indicative of active infection with SARS-CoV-2.  Clinical  correlation with patient history and other diagnostic information is  necessary to determine patient infection status.  Positive results do  not rule out bacterial infection or co-infection with other viruses. If result is PRESUMPTIVE POSTIVE SARS-CoV-2 nucleic acids MAY BE PRESENT.   A presumptive positive result was obtained on the submitted specimen  and confirmed on repeat testing.  While 2019 novel coronavirus  (SARS-CoV-2) nucleic acids may be present in the submitted sample  additional confirmatory testing may be necessary for epidemiological  and / or clinical management purposes  to differentiate between  SARS-CoV-2 and other Sarbecovirus currently known to infect humans.  If clinically indicated additional testing with an alternate test  methodology [phone]) is advised. The SARS-CoV-2 RNA is generally  detectable in upper and lower respiratory sp ecimens during the acute  phase of infection. The expected result is Negative. Fact Sheet for Patients:  StrictlyIdeas.no Fact Sheet for Healthcare Providers: BankingDealers.co.za This test is not yet approved or cleared by the [place] FDA and has been authorized for detection and/or diagnosis of SARS-CoV-2 by FDA under an Emergency Use Authorization (EUA).  This EUA will remain in effect (meaning this test can be used) for the duration of the COVID-19 declaration under Section 564(b)(1) of the Act, 21 U.S.C. section 360bbb-3(b)(1), unless the authorization is terminated or revoked sooner. Performed at [facility], [address] [place], [place]  [address]   Lactic acid, plasma     Status: None   Collection Time: 05/29/19  6:04 PM  Result Value Ref Range   Lactic Acid, Venous 1.9 0.5 - 1.9 mmol/L    Comment: Performed at [facility], [address] [place], [place] [address]  Lactic acid, plasma     Status: None   Collection Time: 05/29/19  7:45 PM  Result Value Ref Range   Lactic Acid, Venous 1.4 0.5 - 1.9 mmol/L    Comment: Performed at [facility], [address] [place], [place] [address]  APTT     Status: None   Collection Time: 05/29/19  7:45 PM  Result Value  Ref Range   aPTT 31 24 - 36 seconds    Comment: Performed at [facility], [address] [place], [place] [address]  Protime-INR     Status: None   Collection Time: 05/29/19  7:45 PM  Result Value Ref Range   Prothrombin Time 15.0 11.4 - 15.2 seconds   INR 1.2 0.8 - 1.2    Comment: (NOTE) INR goal varies based on device and disease states. Performed at [facility], [address] [place], [place] [address]   Troponin I - Now Then Q6H     Status: Abnormal   Collection Time: 05/29/19  9:52 PM  Result Value Ref Range   Troponin I 0.08 (HH) <0.03 ng/mL    Comment: CRITICAL VALUE NOTED. VALUE IS CONSISTENT WITH PREVIOUSLY REPORTED/CALLED VALUE MJU Performed at [facility], [address] [place], [place] [address]    Dg Chest Portable 1 View  Result Date: 05/29/2019 CLINICAL DATA:  Low back pain.  Shortness of breath. EXAM: PORTABLE CHEST 1 VIEW COMPARISON:  None. FINDINGS: Mild opacity in the lateral right lung base. The heart, hila, mediastinum, lungs, and pleura are otherwise normal. IMPRESSION: Mild opacity in the lateral right lung base may represent infiltrate or atelectasis. Recommend short-term follow-up to ensure resolution. Electronically Signed   By: Dorise Bullion III M.D   On: 05/29/2019 16:45    Pending Labs Unresulted Labs (From admission, onward)    Start     Ordered   05/30/19 0500   Lipid panel  Tomorrow morning,   STAT     05/29/19 2316   05/30/19 9147  Basic metabolic panel  Tomorrow morning,   STAT     05/29/19 2316   05/30/19 0500  CBC  Tomorrow morning,   STAT     05/29/19 2316   05/30/19 0100  Heparin level (unfractionated)  Once-Timed,   STAT     05/29/19 1856   05/29/19 2317  Magnesium  Once,   STAT     05/29/19 2316   05/29/19 2317  TSH  Once,   STAT     05/29/19 2316   05/29/19 2317  Hemoglobin A1c  Once,   STAT     05/29/19 2316   05/29/19 2317  HIV antibody (Routine Screening)  Once,   STAT     05/29/19 2316   05/29/19 2317  Sputum culture  (Non-severe pneumonia (non-ICU care) in adult without resistant organism risk factors.)  Once,   STAT     05/29/19 2316   05/29/19 2036  Troponin I - Now Then Q6H  Now then every 6 hours,   STAT     05/29/19 2036   05/29/19 1705  Blood Culture (routine x 2)  BLOOD CULTURE X 2,   STAT     05/29/19 1704   05/29/19 1705  Urinalysis, Complete w Microscopic  ONCE - STAT,   STAT     05/29/19 1704          Vitals/Pain Today's Vitals   05/29/19 1930 05/29/19 2030 05/29/19 2100 05/29/19 2130  BP: 119/84 131/88 128/86 128/87  Pulse: 98 98 93 93  Resp: (!) 22 (!) 33 (!) 32 (!) 33  Temp:      TempSrc:      SpO2: 92% 92% 94% 94%  Weight:      Height:      PainSc:        Isolation Precautions Droplet and Contact precautions  Medications Medications  sodium chloride  flush (NS) 0.9 % injection 3 mL (3 mLs Intravenous Not Given 05/29/19 1914)  cefTRIAXone (ROCEPHIN) 2 g in sodium chloride 0.9 % 100 mL IVPB (0 g Intravenous Stopped 05/29/19 1930)  azithromycin (ZITHROMAX) 500 mg in sodium chloride 0.9 % 250 mL IVPB (0 mg Intravenous Stopped 05/29/19 2042)  heparin bolus via infusion 4,000 Units (4,000 Units Intravenous Bolus from Bag 05/29/19 2000)    Followed by  heparin ADULT infusion 100 units/mL (25000 units/212mL sodium chloride 0.45%) (1,050 Units/hr Intravenous New Bag/Given 05/29/19 2004)  carbidopa-levodopa  (SINEMET IR) 25-100 MG per tablet immediate release 2 tablet (has no administration in time range)  oxybutynin (DITROPAN-XL) 24 hr tablet 5 mg (has no administration in time range)  fenofibrate tablet 160 mg (has no administration in time range)  cholecalciferol (VITAMIN D3) tablet 500 Units (has no administration in time range)  aspirin chewable tablet 324 mg (has no administration in time range)    Or  aspirin suppository 300 mg (has no administration in time range)  aspirin EC tablet 81 mg (has no administration in time range)  nitroGLYCERIN (NITROSTAT) SL tablet 0.4 mg (has no administration in time range)  acetaminophen (TYLENOL) tablet 650 mg (has no administration in time range)  ondansetron (ZOFRAN) injection 4 mg (has no administration in time range)  sodium chloride flush (NS) 0.9 % injection 3 mL (has no administration in time range)  sodium chloride flush (NS) 0.9 % injection 3 mL (has no administration in time range)  0.9 %  sodium chloride infusion (has no administration in time range)  atorvastatin (LIPITOR) tablet 40 mg (has no administration in time range)  cefTRIAXone (ROCEPHIN) 2 g in sodium chloride 0.9 % 100 mL IVPB (has no administration in time range)  azithromycin (ZITHROMAX) 500 mg in sodium chloride 0.9 % 250 mL IVPB (has no administration in time range)  aspirin chewable tablet 324 mg (324 mg Oral Given 05/29/19 1855)    Mobility non-ambulatory High fall risk   Focused Assessments Cardiac Assessment Handoff:  Cardiac Rhythm: Sinus tachycardia Lab Results  Component Value Date   CKTOTAL 101 01/17/2018   TROPONINI 0.08 (HH) 05/29/2019   No results found for: DDIMER Does the Patient currently have chest pain? No     R Recommendations: See Admitting Provider Note  Report given to:   Additional Notes: .

## 2019-05-29 NOTE — ED Notes (Signed)
Pt placed on 4 L O2

## 2019-05-29 NOTE — ED Notes (Addendum)
Pt placed on 6L O2

## 2019-05-29 NOTE — ED Notes (Signed)
Lab called to draw blood cultures as pt is a difficult stick- difficult IV start- no other veins identified

## 2019-05-29 NOTE — ED Notes (Signed)
Troponin 0.06- notified Dr Quentin Cornwall- no new orders at this time

## 2019-05-29 NOTE — ED Notes (Signed)
Pt placed on 2L O2 

## 2019-05-29 NOTE — ED Notes (Signed)
Wife states pt was screaming out in pain on Monday c/o mid back pain-non kidney related as it was higher than his normal kidney pain.

## 2019-05-29 NOTE — ED Provider Notes (Signed)
[facility] Emergency Department Provider Note    First MD Initiated Contact with Patient 05/29/19 1601     (approximate)  I have reviewed the triage vital signs and the nursing notes.   HISTORY  Chief Complaint Back Pain    HPI Brendan Nguyen is a 78 y.o. male below listed past medical history presents the ER for progressive weakness and chest pain radiating through to his back that occurred suddenly on Monday after he is leaving his nephrologist appointment.  Wife reports that he was having severe shortness of breath chest discomfort and was diaphoretic during the drive back home.  She required assistance being taken to the house 3 times over the past week.  He is unable to stand  without 3 person assistance which is new.  He is denying any pain or back discomfort at this time.  No measured fevers.  No nausea or vomiting.   Past Medical History:  Diagnosis Date  . BPH (benign prostatic hyperplasia)   . Chronic kidney disease   . Elevated blood uric acid level   . GERD (gastroesophageal reflux disease)   . Hyperparathyroidism ([place])   . Hypertension   . TIA (transient ischemic attack) 2013   No family history on file. Past Surgical History:  Procedure Laterality Date  . ACHILLES TENDON REPAIR    . L5 lamenectomy     had surgery but not the complete surgery  . l5-s1 laninectomy    . left inguinal hernia     Patient Active Problem List   Diagnosis Date Noted  . Gait abnormality 01/18/2018  . Cerebellar ataxia in diseases classified elsewhere ([place]) 01/18/2018  . Abnormal gait 06/21/2017  . Mixed hyperlipidemia 03/30/2017  . Stage 3 chronic kidney disease ([place]) 03/30/2017  . Polyneuropathy 03/29/2017  . Dizzy spells 03/29/2017  . HBP (high blood pressure) 11/16/2015      Prior to Admission medications   Medication Sig Start Date End Date Taking? Authorizing Provider  aspirin EC 81 MG tablet Take 162 mg by mouth daily.     [provider]  Calcium Carbonate-Vitamin D 600-400 MG-UNIT tablet Take 1 tablet by mouth 2 (two) times daily.    [provider]  carbidopa-levodopa (SINEMET IR) 25-100 MG tablet TAKE 3 TABLETS BY MOUTH 3 TIMES DAILY 01/07/19   Marcial Pacas, MD  fenofibrate 160 MG tablet Take 160 mg by mouth daily.  03/09/17   [provider]  Flaxseed, Linseed, (FLAXSEED OIL PO) Take 1,000 mg by mouth daily.     [provider]  meloxicam (MOBIC) 15 MG tablet Take 15 mg by mouth as needed.    [provider]  Omega-3 1000 MG CAPS Take 2 capsules by mouth daily.    [provider]  oxybutynin (DITROPAN-XL) 5 MG 24 hr tablet Take 1 tablet (5 mg total) by mouth daily. 01/07/19   Marcial Pacas, MD  traMADol (ULTRAM) 50 MG tablet Take 50 mg by mouth as needed. 03/21/18   [provider]  VITAMIN D PO Take 400 Units by mouth daily.    [provider]    Allergies Patient has no known allergies.    Social History Social History   Tobacco Use  . Smoking status: Never Smoker  . Smokeless tobacco: Never Used  Substance Use Topics  . Alcohol use: No    Alcohol/week: 0.0 standard drinks  . Drug use: No    Review of Systems Patient denies headaches, rhinorrhea, blurry vision,  numbness, shortness of breath, chest pain, edema, cough, abdominal pain, nausea, vomiting, diarrhea, dysuria, fevers, rashes or hallucinations unless otherwise stated above in HPI. ____________________________________________   PHYSICAL EXAM:  VITAL SIGNS: Vitals:   05/29/19 1616 05/29/19 1710  BP:    Pulse:    Resp:    Temp:  (!) 101.5 F (38.6 C)  SpO2: 93%     Constitutional: Alert confused (alzheimers dementia) mild respiratory distress Eyes: Conjunctivae are normal.  Head: Atraumatic. Nose: No congestion/rhinnorhea. Mouth/Throat: Mucous membranes are moist.   Neck: No stridor. Painless ROM.  Cardiovascular: Normal rate, regular rhythm. Grossly normal heart  sounds.  Good peripheral circulation. Respiratory: Normal respiratory effort.  No retractions. Lungs CTAB. Gastrointestinal: Soft and nontender. No distention. No abdominal bruits. No CVA tenderness. Genitourinary: deferred Musculoskeletal: No lower extremity tenderness nor edema. 2+ dp and t pulses. No joint effusions. Neurologic:  Normal speech and language. No gross focal neurologic deficits are appreciated. No facial droop Skin:  Skin is warm, dry and intact. No rash noted. Psychiatric: Mood and affect are normal.  ____________________________________________   LABS (all labs ordered are listed, but only abnormal results are displayed)  Results for orders placed or performed during the hospital encounter of 05/29/19 (from the past 24 hour(s))  Basic metabolic panel     Status: Abnormal   Collection Time: 05/29/19  4:00 PM  Result Value Ref Range   Sodium 138 135 - 145 mmol/L   Potassium 4.4 3.5 - 5.1 mmol/L   Chloride 104 98 - 111 mmol/L   CO2 23 22 - 32 mmol/L   Glucose, Bld 144 (H) 70 - 99 mg/dL   BUN 34 (H) 8 - 23 mg/dL   Creatinine, Ser 1.70 (H) 0.61 - 1.24 mg/dL   Calcium 9.8 8.9 - 10.3 mg/dL   GFR calc non Af Amer 38 (L) >60 mL/min   GFR calc Af Amer 43 (L) >60 mL/min   Anion gap 11 5 - 15  CBC     Status: Abnormal   Collection Time: 05/29/19  4:00 PM  Result Value Ref Range   WBC 15.3 (H) 4.0 - 10.5 K/uL   RBC 5.25 4.22 - 5.81 MIL/uL   Hemoglobin 16.8 13.0 - 17.0 g/dL   HCT 49.2 39.0 - 52.0 %   MCV 93.7 80.0 - 100.0 fL   MCH 32.0 26.0 - 34.0 pg   MCHC 34.1 30.0 - 36.0 g/dL   RDW 13.0 11.5 - 15.5 %   Platelets 256 150 - 400 K/uL   nRBC 0.0 0.0 - 0.2 %  Troponin I - ONCE - STAT     Status: Abnormal   Collection Time: 05/29/19  4:00 PM  Result Value Ref Range   Troponin I 0.06 (HH) <0.03 ng/mL  Hepatic function panel     Status: None   Collection Time: 05/29/19  4:00 PM  Result Value Ref Range   Total Protein 7.1 6.5 - 8.1 g/dL   Albumin 4.0 3.5 - 5.0 g/dL    AST 18 15 - 41 U/L   ALT 10 0 - 44 U/L   Alkaline Phosphatase 39 38 - 126 U/L   Total Bilirubin 1.0 0.3 - 1.2 mg/dL   Bilirubin, Direct 0.2 0.0 - 0.2 mg/dL   Indirect Bilirubin 0.8 0.3 - 0.9 mg/dL  Brain natriuretic peptide     Status: Abnormal   Collection Time: 05/29/19  4:00 PM  Result Value Ref Range   B Natriuretic Peptide 716.0 (H) 0.0 - 100.0 pg/mL  Procalcitonin     Status: None   Collection Time: 05/29/19  4:00 PM  Result Value Ref Range   Procalcitonin <0.10 ng/mL  SARS Coronavirus 2 (CEPHEID- Performed in [place] hospital lab), Hosp Order     Status: None   Collection Time: 05/29/19  4:23 PM   Specimen: Nasopharyngeal Swab  Result Value Ref Range   SARS Coronavirus 2 NEGATIVE NEGATIVE   ____________________________________________  EKG My review and personal interpretation at Time: 15:51   Indication: sob  Rate: 90  Rhythm: sinus Axis: normal Other: inferolateral t wave inversions, no stemi ____________________________________________  RADIOLOGY  I personally reviewed all radiographic images ordered to evaluate for the above acute complaints and reviewed radiology reports and findings.  These findings were personally discussed with the patient.  Please see medical record for radiology report.  ____________________________________________   PROCEDURES  Procedure(s) performed:  .Critical Care Performed by: Merlyn Lot, MD Authorized by: Merlyn Lot, MD   Critical care provider statement:    Critical care time (minutes):  30   Critical care time was exclusive of:  Separately billable procedures and treating other patients   Critical care was necessary to treat or prevent imminent or life-threatening deterioration of the following conditions:  Respiratory failure   Critical care was time spent personally by me on the following activities:  Development of treatment plan with patient or surrogate, discussions with consultants, evaluation of patient's  response to treatment, examination of patient, obtaining history from patient or surrogate, ordering and performing treatments and interventions, ordering and review of laboratory studies, ordering and review of radiographic studies, pulse oximetry, re-evaluation of patient's condition and review of old charts      Critical Care performed: yes ____________________________________________   INITIAL IMPRESSION / [place] / ED COURSE  Pertinent labs & imaging results that were available during my care of the patient were reviewed by me and considered in my medical decision making (see chart for details).   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  Patient is found to be febrile mild tachycardia and hypoxia.  Patient placed on supplemental oxygen with improvement respirations.  Chest x-ray concerning for developing infiltrate.  Does have leukocytosis.  Also concerning history for recent chest pain on Monday with diaphoresis now with EKG changes certainly concerning for ACS.  Lower suspicion for PE and fever and chest x-ray findings given his borderline renal function will heparinize nevertheless for ACS.  Will give aspirin.  His abdominal exam is soft and benign.  Will treat for community-acquired pneumonia.     The patient was evaluated in Emergency Department today for the symptoms described in the history of present illness. He/she was evaluated in the context of the global COVID-19 pandemic, which necessitated consideration that the patient might be at risk for infection with the SARS-CoV-2 virus that causes COVID-19. Institutional protocols and algorithms that pertain to the evaluation of patients at risk for COVID-19 are in a state of rapid change based on information released by regulatory bodies including the CDC and federal and state organizations. These policies and algorithms were  followed during the patient's care in the ED.  As part of my medical decision making, I reviewed the following data within the [place] notes reviewed and incorporated, Labs reviewed, notes from prior ED visits and [place] Controlled Substance Database   ____________________________________________  FINAL CLINICAL IMPRESSION(S) / ED DIAGNOSES  Final diagnoses:  Acute respiratory failure with hypoxia (HCC)      NEW MEDICATIONS STARTED DURING THIS VISIT:  New Prescriptions   No medications on file     Note:  This document was prepared using Dragon voice recognition software and may include unintentional dictation errors.    Merlyn Lot, MD 05/29/19 [phone]

## 2019-05-29 NOTE — ED Notes (Signed)
Lab at bedside

## 2019-05-29 NOTE — ED Triage Notes (Signed)
Pt here with lower back pain, wife states he had an episode of shob, and diaphoresis on Monday. Some episodes of confusion which isn't new. Pt appears in NAD, answers questions appropriately at this time. In lobby, pt was unable to understand verbal instructions of moving from his wheelchair from home to [place] wheelchair. Pt was weak.

## 2019-05-29 NOTE — Consult Note (Signed)
ANTICOAGULATION CONSULT NOTE - Initial Consult  Pharmacy Consult for Heparin Drip Indication: chest pain/ACS  No Known Allergies  Patient Measurements: Height: 5\' 8"  (172.7 cm) Weight: 200 lb (90.7 kg) IBW/kg (Calculated) : 68.4 Heparin Dosing Weight: 87.1 kg  Vital Signs: Temp: 101.5 F (38.6 C) (06/17 1710) Temp Source: Rectal (06/17 1710) BP: 125/85 (06/17 1606) Pulse Rate: 110 (06/17 1609)  Labs: Recent Labs    05/29/19 1600  HGB 16.8  HCT 49.2  PLT 256  CREATININE 1.70*  TROPONINI 0.06*    Estimated Creatinine Clearance: 38.5 mL/min (A) (by C-G formula based on SCr of 1.7 mg/dL (H)).   Medical History: Past Medical History:  Diagnosis Date  . BPH (benign prostatic hyperplasia)   . Chronic kidney disease   . Elevated blood uric acid level   . GERD (gastroesophageal reflux disease)   . Hyperparathyroidism ([place])   . Hypertension   . TIA (transient ischemic attack) 2013    Medications:  No documented PTA anticoagulation  Assessment: 78 y.o. male below listed past medical history presents the ER for progressive weakness and chest pain radiating through to his back that occurred suddenly on Monday.    Pharmacy has been consulted for heparin drip initiation and monitoring for ACS/STEMI.  Goal of Therapy:  Heparin level 0.3-0.7 units/ml Monitor platelets by anticoagulation protocol: Yes   Plan:  Give 4000 units bolus x 1, followed by 1050 units/hour.    Will obtain baseline APTT and INR, and will check Heparin Level in 6 hours per protocol.  Lu Duffel, PharmD, BCPS Clinical Pharmacist 05/29/2019 6:54 PM

## 2019-05-30 ENCOUNTER — Inpatient Hospital Stay (HOSPITAL_COMMUNITY)
Admit: 2019-05-30 | Discharge: 2019-05-30 | Disposition: A | Discharge status: Home / Self Care | Payer: PPO | Attending: Nurse Practitioner | Admitting: Nurse Practitioner

## 2019-05-30 ENCOUNTER — Other Ambulatory Visit: Payer: Self-pay

## 2019-05-30 ENCOUNTER — Inpatient Hospital Stay: Payer: PPO

## 2019-05-30 DIAGNOSIS — M546 Pain in thoracic spine: Secondary | ICD-10-CM

## 2019-05-30 DIAGNOSIS — R9431 Abnormal electrocardiogram [ECG] [EKG]: Secondary | ICD-10-CM

## 2019-05-30 DIAGNOSIS — R079 Chest pain, unspecified: Secondary | ICD-10-CM

## 2019-05-30 DIAGNOSIS — J181 Lobar pneumonia, unspecified organism: Secondary | ICD-10-CM

## 2019-05-30 DIAGNOSIS — J9601 Acute respiratory failure with hypoxia: Secondary | ICD-10-CM

## 2019-05-30 DIAGNOSIS — I248 Other forms of acute ischemic heart disease: Secondary | ICD-10-CM

## 2019-05-30 LAB — CBC
HCT: 46.9 % (ref 39.0–52.0)
Hemoglobin: 15.5 g/dL (ref 13.0–17.0)
MCH: 31.4 pg (ref 26.0–34.0)
MCHC: 33 g/dL (ref 30.0–36.0)
MCV: 95.1 fL (ref 80.0–100.0)
Platelets: 216 10*3/uL (ref 150–400)
RBC: 4.93 MIL/uL (ref 4.22–5.81)
RDW: 12.9 % (ref 11.5–15.5)
WBC: 11.9 10*3/uL — ABNORMAL HIGH (ref 4.0–10.5)
nRBC: 0 % (ref 0.0–0.2)

## 2019-05-30 LAB — ECHOCARDIOGRAM COMPLETE
Height: 68 in
Weight: 3075.2 oz

## 2019-05-30 LAB — HEPARIN LEVEL (UNFRACTIONATED)
Heparin Unfractionated: <0.1 IU/mL — ABNORMAL LOW (ref 0.30–0.70)
Heparin Unfractionated: <0.1 IU/mL — ABNORMAL LOW (ref 0.30–0.70)
Heparin Unfractionated: 0.21 IU/mL — ABNORMAL LOW (ref 0.30–0.70)

## 2019-05-30 LAB — LIPID PANEL
Cholesterol: 120 mg/dL (ref 0–200)
HDL: 23 mg/dL — ABNORMAL LOW (ref >40)
LDL Cholesterol: 55 mg/dL (ref 0–99)
Total CHOL/HDL Ratio: 5.2 RATIO
Triglycerides: 211 mg/dL — ABNORMAL HIGH (ref <150)
VLDL: 42 mg/dL — ABNORMAL HIGH (ref 0–40)

## 2019-05-30 LAB — BASIC METABOLIC PANEL
Anion gap: 10 (ref 5–15)
BUN: 33 mg/dL — ABNORMAL HIGH (ref 8–23)
CO2: 22 mmol/L (ref 22–32)
Calcium: 9.4 mg/dL (ref 8.9–10.3)
Chloride: 108 mmol/L (ref 98–111)
Creatinine, Ser: 1.57 mg/dL — ABNORMAL HIGH (ref 0.61–1.24)
GFR calc Af Amer: 48 mL/min — ABNORMAL LOW (ref >60)
GFR calc non Af Amer: 41 mL/min — ABNORMAL LOW (ref >60)
Glucose, Bld: 129 mg/dL — ABNORMAL HIGH (ref 70–99)
Potassium: 4 mmol/L (ref 3.5–5.1)
Sodium: 140 mmol/L (ref 135–145)

## 2019-05-30 LAB — TROPONIN I
Troponin I: 0.06 ng/mL (ref <0.03)
Troponin I: 0.05 ng/mL (ref <0.03)

## 2019-05-30 LAB — HEMOGLOBIN A1C
Hgb A1c MFr Bld: 5.7 % — ABNORMAL HIGH (ref 4.8–5.6)
Mean Plasma Glucose: 116.89 mg/dL

## 2019-05-30 LAB — TSH: TSH: 1.717 u[IU]/mL (ref 0.350–4.500)

## 2019-05-30 LAB — MAGNESIUM: Magnesium: 2.2 mg/dL (ref 1.7–2.4)

## 2019-05-30 MED ORDER — PANTOPRAZOLE SODIUM 40 MG IV SOLR
40.0000 mg | INTRAVENOUS | Status: DC
  Administered 2019-05-30 – 2019-06-01 (×3): 40 mg via INTRAVENOUS
  Filled 2019-05-30 (×3): qty 40

## 2019-05-30 MED ORDER — IPRATROPIUM-ALBUTEROL 0.5-2.5 (3) MG/3ML IN SOLN: 3.0000 mL | Freq: Four times a day (QID) | RESPIRATORY_TRACT | Status: DC | PRN

## 2019-05-30 MED ORDER — SODIUM CHLORIDE 0.9 % IV SOLN
INTRAVENOUS | Status: DC
  Administered 2019-05-30 – 2019-06-01 (×4): via INTRAVENOUS

## 2019-05-30 MED ORDER — IOHEXOL 350 MG/ML SOLN
60.0000 mL | Freq: Once | INTRAVENOUS | Status: AC | PRN
  Administered 2019-05-30: 60 mL via INTRAVENOUS

## 2019-05-30 MED ORDER — HEPARIN (PORCINE) 25000 UT/250ML-% IV SOLN
1800.0000 [IU]/h | INTRAVENOUS | Status: DC
  Administered 2019-05-31: 1600 [IU]/h via INTRAVENOUS
  Administered 2019-05-31: 1800 [IU]/h via INTRAVENOUS
  Filled 2019-05-30 (×3): qty 250

## 2019-05-30 MED ORDER — HEPARIN BOLUS VIA INFUSION
2600.0000 [IU] | Freq: Once | INTRAVENOUS | Status: AC
  Administered 2019-05-30: 2600 [IU] via INTRAVENOUS
  Filled 2019-05-30: qty 2600

## 2019-05-30 MED ORDER — HEPARIN BOLUS VIA INFUSION
1300.0000 [IU] | Freq: Once | INTRAVENOUS | Status: AC
  Administered 2019-05-30: 1300 [IU] via INTRAVENOUS
  Filled 2019-05-30: qty 1300

## 2019-05-30 MED ORDER — ATORVASTATIN CALCIUM 20 MG PO TABS: 40.0000 mg | ORAL_TABLET | Freq: Every day | ORAL | Status: DC

## 2019-05-30 NOTE — Consult Note (Signed)
ANTICOAGULATION CONSULT NOTE - Initial Consult  Pharmacy Consult for Heparin Drip Indication: chest pain/ACS  No Known Allergies  Patient Measurements: Height: 5\' 8"  (172.7 cm) Weight: 192 lb 3.2 oz (87.2 kg) IBW/kg (Calculated) : 68.4 Heparin Dosing Weight: 87.1 kg  Vital Signs: Temp: 98.7 F (37.1 C) (06/18 0019) Temp Source: Oral (06/18 0019) BP: 135/76 (06/18 0019) Pulse Rate: 91 (06/18 0019)  Labs: Recent Labs    05/29/19 1600 05/29/19 1945 05/29/19 2152 05/30/19 0056  HGB 16.8  --   --   --   HCT 49.2  --   --   --   PLT 256  --   --   --   APTT  --  31  --   --   LABPROT  --  15.0  --   --   INR  --  1.2  --   --   HEPARINUNFRC  --   --   --  <0.10*  CREATININE 1.70*  --   --   --   TROPONINI 0.06*  --  0.08*  --     Estimated Creatinine Clearance: 37.8 mL/min (A) (by C-G formula based on SCr of 1.7 mg/dL (H)).   Medical History: Past Medical History:  Diagnosis Date  . BPH (benign prostatic hyperplasia)   . Chronic kidney disease   . Elevated blood uric acid level   . GERD (gastroesophageal reflux disease)   . Hyperparathyroidism ([place])   . Hypertension   . TIA (transient ischemic attack) 2013    Medications:  No documented PTA anticoagulation  Assessment: 78 y.o. male below listed past medical history presents the ER for progressive weakness and chest pain radiating through to his back that occurred suddenly on Monday.    Pharmacy has been consulted for heparin drip initiation and monitoring for ACS/STEMI.  Goal of Therapy:  Heparin level 0.3-0.7 units/ml Monitor platelets by anticoagulation protocol: Yes   Plan:  06/18 @ 0000 HL < 0.10 subtherapeutic. Will rebolus w/ heparin 2600 units IV x 1 and will increase rate to 1200 units/hr and will recheck HL @ 1000, CBC check w/ am labs.  Tobie Lords, PharmD, BCPS Clinical Pharmacist 05/30/2019 1:40 AM

## 2019-05-30 NOTE — Consult Note (Signed)
Cardiology Consult    Patient ID: Brendan Nguyen MRN: [id number], DOB/AGE: 06-30-1941   Admit date: 05/29/2019 Date of Consult: 05/30/2019  Primary Physician: Dion Body, MD Primary Cardiologist: Ida Rogue, MD - new Requesting Provider: S. Sudini, MD  Patient Profile    Brendan Nguyen is a 78 y.o. male with a history of BPH, GERD, HTN, HL, HTG, TIA, back pain, and CKD III, who is being seen today for the evaluation of dyspnea w/ mild troponin elevation at the request of Dr. Darvin Neighbours.  Past Medical History   Past Medical History:  Diagnosis Date  . BPH (benign prostatic hyperplasia)   . CKD (chronic kidney disease), stage III ([place])   . Elevated blood uric acid level   . GERD (gastroesophageal reflux disease)   . History of echocardiogram    a.02/2016 Echo: EF 50-55%. Mild LVH. Mild AI. Triv MR/TR.  Marland Kitchen Hyperparathyroidism ([place])   . Hypertension   . Hypertriglyceridemia   . Mixed hyperlipidemia   . TIA (transient ischemic attack) 2013    Past Surgical History:  Procedure Laterality Date  . ACHILLES TENDON REPAIR    . BLEPHAROPLASTY    . L5 lamenectomy     had surgery but not the complete surgery  . l5-s1 laninectomy    . left inguinal hernia       Allergies  No Known Allergies  History of Present Illness    78 y/o ? w/ the above PMH including HTN, TIA, HL, HTG, GERD, BPH, and CKD III.  He was previously evaluated by [facility] in 2017 2/2 dizziness/presyncope.  Echo @ that time showed nl LV fxn w/ mild LVH, mild AI, and triv MR/TR.  Symptoms were felt to be 2/2 orthostasis, and adjustments were made to his bp medication regimen (ARB discontinued).  He has not required cardiology f/u since.  He has chronic lower back pain and this has limited his mobility some.  He was having leg weakness, but was previously very active in the gym and this allowed him to maintain his strength.  Once Covid hit, his gym closed and he noted return of leg weakness and  trembling.  He was working with PT and doing alright, but he hasn't worked w/ PT in at least a month.  This has resulted in progressive lower extremity weakness, to the point that he now has difficulty ambulating and transferring.  Over the past 2-3 wks, he has been coughing intermittently. This has become more frequent, bothersome, and increasingly productive.  He will sometimes feel like he can't get a good breath.  On Monday 6/15, he went to his nephrologist and then after the visit, he was wheeled back to his car.  He had significant difficulty transferring from the w/c to his wife's Lucianne Lei due to lower ext wkns, and it took about 30 mins to get in the [place].  In that setting, he was working hard and became very short of breath.  After returning home, he was washed out for the remainder of the day.  He saw his PCP on 6/17, due to cough, weakness, and dyspnea w/ shallow breathing.  He says that he never had or reported chest pain.  ECG was performed and showed new, relatively diffuse TWI.  As a result, he was referred to the ED for eval.  There, CXR showed a new lateral right lung base opacity suggestive of PNA.  Troponin was elevated @ 0.06  0.08  0.06 and he was admitted for further  eval and abx.  He denies c/p or dyspnea this AM, but cont to cough.  Covid neg.  Inpatient Medications    . aspirin  324 mg Oral NOW   Or  . aspirin  300 mg Rectal NOW  . aspirin EC  81 mg Oral Daily  . atorvastatin  40 mg Oral q1800  . atorvastatin  40 mg Oral q1800  . carbidopa-levodopa  2 tablet Oral TID  . cholecalciferol  500 Units Oral Daily  . fenofibrate  160 mg Oral Daily  . oxybutynin  5 mg Oral Daily  . pantoprazole (PROTONIX) IV  40 mg Intravenous Q24H  . sodium chloride flush  3 mL Intravenous Once  . sodium chloride flush  3 mL Intravenous Q12H    Family History    History reviewed. No pertinent family history. He indicated that his mother is deceased. He indicated that his father is deceased.   Social History    Social History   Socioeconomic History  . Marital status: Married    Spouse name: Not on file  . Number of children: Not on file  . Years of education: Not on file  . Highest education level: Not on file  Occupational History  . Not on file  Social Needs  . Financial resource strain: Not on file  . Food insecurity    Worry: Not on file    Inability: Not on file  . Transportation needs    Medical: Not on file    Non-medical: Not on file  Tobacco Use  . Smoking status: Never Smoker  . Smokeless tobacco: Never Used  Substance and Sexual Activity  . Alcohol use: No    Alcohol/week: 0.0 standard drinks  . Drug use: No  . Sexual activity: Not on file  Lifestyle  . Physical activity    Days per week: Not on file    Minutes per session: Not on file  . Stress: Not on file  Relationships  . Social Herbalist on phone: Not on file    Gets together: Not on file    Attends religious service: Not on file    Active member of club or organization: Not on file    Attends meetings of clubs or organizations: Not on file    Relationship status: Not on file  . Intimate partner violence    Fear of current or ex partner: Not on file    Emotionally abused: Not on file    Physically abused: Not on file    Forced sexual activity: Not on file  Other Topics Concern  . Not on file  Social History Narrative  . Not on file     Review of Systems    General:  No chills, fever, night sweats or weight changes.  Cardiovascular:  No chest pain, +++ dyspnea on exertion, no edema, orthopnea, palpitations, paroxysmal nocturnal dyspnea. Dermatological: No rash, lesions/masses Respiratory: +++ cough, +++ dyspnea Urologic: No hematuria, dysuria Abdominal:   No nausea, vomiting, diarrhea, bright red blood per rectum, melena, or hematemesis Neurologic:  No visual changes, +++ LE wkns and trembling, no changes in mental status. All other systems reviewed and are otherwise  negative except as noted above.  Physical Exam    Blood pressure 133/73, pulse 86, temperature 97.7 F (36.5 C), temperature source Oral, resp. rate 20, height 5\' 8"  (1.727 m), weight 87.2 kg, SpO2 93 %.  General: Pleasant, NAD Psych: Normal affect. Neuro: Alert and oriented X 3. Moves  all extremities spontaneously. HEENT: Normal  Neck: Supple without bruits or JVD. Lungs:  Resp regular and unlabored, CTA. Heart: RRR no s3, s4, or murmurs. Abdomen: Soft, non-tender, non-distended, BS + x 4.  Extremities: No clubbing, cyanosis or edema. DP/PT/Radials 2+ and equal bilaterally.  Labs    Troponin  Recent Labs    05/29/19 1600 05/29/19 2152 05/30/19 0344  TROPONINI 0.06* 0.08* 0.06*   Lab Results  Component Value Date   WBC 11.9 (H) 05/30/2019   HGB 15.5 05/30/2019   HCT 46.9 05/30/2019   MCV 95.1 05/30/2019   PLT 216 05/30/2019    Recent Labs  Lab 05/29/19 1600 05/30/19 0344  NA 138 140  K 4.4 4.0  CL 104 108  CO2 23 22  BUN 34* 33*  CREATININE 1.70* 1.57*  CALCIUM 9.8 9.4  PROT 7.1  --   BILITOT 1.0  --   ALKPHOS 39  --   ALT 10  --   AST 18  --   GLUCOSE 144* 129*   Lab Results  Component Value Date   CHOL 120 05/30/2019   HDL 23 (L) 05/30/2019   LDLCALC 55 05/30/2019   TRIG 211 (H) 05/30/2019    Radiology Studies    Dg Chest Portable 1 View  Result Date: 05/29/2019 CLINICAL DATA:  Low back pain.  Shortness of breath. EXAM: PORTABLE CHEST 1 VIEW COMPARISON:  None. FINDINGS: Mild opacity in the lateral right lung base. The heart, hila, mediastinum, lungs, and pleura are otherwise normal. IMPRESSION: Mild opacity in the lateral right lung base may represent infiltrate or atelectasis. Recommend short-term follow-up to ensure resolution. Electronically Signed   By: Dorise Bullion III M.D   On: 05/29/2019 16:45    ECG & Cardiac Imaging    RSR, 93, PVC, inferior and anterolateral TWI - personally reviewed - new since 08/2016.  Assessment & Plan    1.  R  basilar PNA:  Several wk h/o productive cough and dyspnea assoc w/ wkns.  Found to have lateral R lung base infiltrate.  Afebrile.  Covid neg.  WBC 11.  Abx per IM.  2.  Elevated troponin:  Pt denies any recent or prior h/o chest pain.  As above, he has had cough and dyspnea.  ECG @ PCP, in ED, and again this AM, shows new, diffuse TWI, which was not present in 2017.  Trop also mildly elevated w/ flat trend 0.06  0.08  0.06.  Echo pending.  Provided EF nl, will plan on lexiscan myoview in the AM.  Cont asa, heparin, statin.  3.  Essential HTN:  Stable.  4.  HL:  Cont statin.  LDL 55.  5. CKD III: stable.   Signed, Murray Hodgkins, NP 05/30/2019, 8:56 AM  For questions or updates, please contact   Please consult [email or web address] for contact info under Cardiology/STEMI.

## 2019-05-30 NOTE — Progress Notes (Signed)
*  PRELIMINARY RESULTS* Echocardiogram 2D Echocardiogram has been performed.  Brendan Nguyen 05/30/2019, 9:00 AM

## 2019-05-30 NOTE — Progress Notes (Signed)
Discussed with Dr. Juleen China regarding contrast for CTA. WIll order CTA. He has added IVF. Not on ACE/ARB or diuretics. Creatinine at baseline

## 2019-05-30 NOTE — Consult Note (Addendum)
ANTICOAGULATION CONSULT NOTE - Initial Consult  Pharmacy Consult for Heparin Drip Indication: chest pain/ACS  No Known Allergies  Patient Measurements: Height: 5\' 8"  (172.7 cm) Weight: 192 lb 3.2 oz (87.2 kg) IBW/kg (Calculated) : 68.4 Heparin Dosing Weight: 87.1 kg  Vital Signs: Temp: 97.7 F (36.5 C) (06/18 0742) Temp Source: Oral (06/18 0742) BP: 133/73 (06/18 0742) Pulse Rate: 86 (06/18 0742)  Labs: Recent Labs    05/29/19 1600 05/29/19 1945 05/29/19 2152 05/30/19 0056 05/30/19 0344 05/30/19 0955  HGB 16.8  --   --   --  15.5  --   HCT 49.2  --   --   --  46.9  --   PLT 256  --   --   --  216  --   APTT  --  31  --   --   --   --   LABPROT  --  15.0  --   --   --   --   INR  --  1.2  --   --   --   --   HEPARINUNFRC  --   --   --  <0.10*  --  <0.10*  CREATININE 1.70*  --   --   --  1.57*  --   TROPONINI 0.06*  --  0.08*  --  0.06* 0.05*    Estimated Creatinine Clearance: 41 mL/min (A) (by C-G formula based on SCr of 1.57 mg/dL (H)).   Medical History: Past Medical History:  Diagnosis Date  . BPH (benign prostatic hyperplasia)   . CKD (chronic kidney disease), stage III ([place])   . Elevated blood uric acid level   . GERD (gastroesophageal reflux disease)   . History of echocardiogram    a.02/2016 Echo: EF 50-55%. Mild LVH. Mild AI. Triv MR/TR.  Marland Kitchen Hyperparathyroidism ([place])   . Hypertension   . Hypertriglyceridemia   . Mixed hyperlipidemia   . TIA (transient ischemic attack) 2013    Medications:  No documented PTA anticoagulation  Assessment: 78 y.o. male below listed past medical history presents the ER for progressive weakness and chest pain radiating through to his back that occurred suddenly on Monday. Pharmacy has been consulted for heparin drip initiation and monitoring for ACS/STEMI. Spoke with RN Mendel Ryder and heparin has been continuously running no pauses or breaks.    6/18 @0056  HL < 0.1 rebolus w/ heparin 2600 units IV x 1 and will increase  rate to 1200 units/hr  6/18 @0955  HL < 0.1  Goal of Therapy:  Heparin level 0.3-0.7 units/ml Monitor platelets by anticoagulation protocol: Yes   Plan:  Heparin level subtherapeutic. Will rebolus w/ heparin 2600 units IV x 1 and will increase rate to 1450 units/hr and will recheck HL @ 2100, CBC check w/ am labs.  Oswald Hillock, PharmD, BCPS Clinical Pharmacist 05/30/2019 12:53 PM

## 2019-05-30 NOTE — Progress Notes (Signed)
   Notified by patient's nurse of abnormal CTA chest demonstrating the following:  1. Extensive acute bilateral pulmonary emboli as detailed above including a saddle pulmonary embolus. There is CT evidence of right heart strain. Positive for acute PE with CT evidence of right heart strain (RV/LV Ratio = 1.3) consistent with at least submassive (intermediate risk) PE. The presence of right heart strain has been associated with an increased risk of morbidity and mortality. 2. Airspace opacity in the right middle lobe may represent infiltrate or developing pulmonary infarct in the setting of acute pulmonary emboli. 3. Trace right-sided pleural effusion. 4. Cardiomegaly. 5. Hepatic steatosis. 6. Cholelithiasis.  Patient is on heparin infusion which was initiated upon admission.  However the most recent PTT was subtherapeutic.  Will continue heparin and review repeated PTT at 2100.

## 2019-05-30 NOTE — TOC Initial Note (Addendum)
Transition of Care [facility]) - Initial/Assessment Note    Patient Details  Name: Brendan Nguyen MRN: [id number] Date of Birth: 03-Feb-1941  Transition of Care [facility] CM/SW Contact:    Elza Rafter, RN Phone Number: 05/30/2019, 2:44 PM  Clinical Narrative:      Patient is from home with wife.  Spoke with [name] [phone].  He is current with his PCP; obtains medications at [facility] without difficulty.    He has a transfer chair and walker at home.  She states he has neuropathy in his lower extremities and has a difficult time ambulating and transferring.  Offered more DME such as wheelchair or hoyer lift and she has declined at this time.  She has spoken with [facility] and they say he is approved for [facility].  Offered home health services and she would like a home health RN, PT and aide.  She does not have a preference. Referral to [facility].   @14 :[facility], [facility], [facility],    Expected Discharge Plan: [place] Barriers to Discharge: Continued Medical Work up   Patient Goals and CMS Choice Patient states their goals for this hospitalization and ongoing recovery are:: spoke with wife carol and she would like him to come home with home health CMS Medicare.gov Compare Post Acute Care list provided to:: Patient Choice offered to / list presented to : Patient  Expected Discharge Plan and Services Expected Discharge Plan: [place]   Discharge Planning Services: CM Consult   Living arrangements for the past 2 months: Single Family Home                           HH Arranged: RN, PT, Nurse's Aide          Prior Living Arrangements/Services Living arrangements for the past 2 months: Single Family Home Lives with:: Spouse   Do you feel safe going back to the place where you live?: Yes            Criminal Activity/Legal Involvement Pertinent to Current Situation/Hospitalization: No - Comment as needed  Activities of Daily Living Home  Assistive Devices/Equipment: Walker (specify type), Raised toilet seat with rails ADL Screening (condition at time of admission) Patient's cognitive ability adequate to safely complete daily activities?: Yes Is the patient deaf or have difficulty hearing?: No Does the patient have difficulty seeing, even when wearing glasses/contacts?: No Does the patient have difficulty concentrating, remembering, or making decisions?: No Patient able to express need for assistance with ADLs?: Yes Does the patient have difficulty dressing or bathing?: Yes Independently performs ADLs?: No Communication: Independent Dressing (OT): Needs assistance Is this a change from baseline?: Pre-admission baseline Grooming: Needs assistance Is this a change from baseline?: Pre-admission baseline Feeding: Independent Bathing: Needs assistance Is this a change from baseline?: Pre-admission baseline Toileting: Needs assistance Is this a change from baseline?: Pre-admission baseline In/Out Bed: Needs assistance Is this a change from baseline?: Pre-admission baseline Walks in Home: Needs assistance Is this a change from baseline?: Pre-admission baseline Does the patient have difficulty walking or climbing stairs?: Yes Weakness of Legs: Both Weakness of Arms/Hands: Both  Permission Sought/Granted Permission sought to share information with : Facility Art therapist granted to share information with : Yes, Verbal Permission Granted     Permission granted to share info w AGENCY: home health agencies        Emotional Assessment  Admission diagnosis:  Pain [R52] Acute respiratory failure with hypoxia ([place]) [J96.01] Patient Active Problem List   Diagnosis Date Noted  . ACS (acute coronary syndrome) ([place]) 05/29/2019  . Gait abnormality 01/18/2018  . Cerebellar ataxia in diseases classified elsewhere ([facility]) 01/18/2018  . Abnormal gait 06/21/2017  . Mixed hyperlipidemia  03/30/2017  . Stage 3 chronic kidney disease ([place]) 03/30/2017  . Polyneuropathy 03/29/2017  . Dizzy spells 03/29/2017  . HBP (high blood pressure) 11/16/2015   PCP:  Dion Body, MD Pharmacy:   [place], [place] - [address] [place] [address] Phone: [phone] Fax: [phone]     Social Determinants of Health (SDOH) Interventions    Readmission Risk Interventions No flowsheet data found.

## 2019-05-30 NOTE — Progress Notes (Signed)
[facility]  ROUNDING NOTE   Subjective:   [name] admitted to [facility] on 05/29/2019 for Pain [R52] Acute respiratory failure with hypoxia ([place]) [J96.01]  There is concern for pulmonary embolism. It is recommended patient undergo CT angiogram.   Nephrology consulted for patient's chronic kidney disease stage III. Last seen in our office on 6/15 by my partner, Dr. Holley Raring. Baseline creatinine of 1.58, GFR of 41 on 01/24/2019. Similar kidney function on today's encounter.   Objective:  Vital signs in last 24 hours:  Temp:  [97.6 F (36.4 C)-101.5 F (38.6 C)] 98.1 F (36.7 C) (06/18 1616) Pulse Rate:  [83-105] 87 (06/18 1616) Resp:  [20-36] 20 (06/18 0500) BP: (113-148)/(73-94) 134/87 (06/18 1616) SpO2:  [91 %-96 %] 91 % (06/18 1616) Weight:  [87.2 kg] 87.2 kg (06/18 0024)  Weight change:  Filed Weights   05/29/19 1549 05/30/19 0024  Weight: 90.7 kg 87.2 kg    Intake/Output: I/O last 3 completed shifts: In: 374.2 [I.V.:124.2; IV Piggyback:250] Out: 375 [Urine:375]   Intake/Output this shift:  Total I/O In: 3 [I.V.:3] Out: -   Physical Exam: General: NAD,   Head: Normocephalic, atraumatic. Moist oral mucosal membranes  Eyes: Anicteric, PERRL  Neck: Supple, trachea midline  Lungs:  Clear to auscultation  Heart: Regular rate and rhythm  Abdomen:  Soft, nontender,   Extremities:  no peripheral edema.  Neurologic: Nonfocal, moving all four extremities  Skin: No lesions       Basic Metabolic Panel: Recent Labs  Lab 05/29/19 1600 05/30/19 0056 05/30/19 0344  NA 138  --  140  K 4.4  --  4.0  CL 104  --  108  CO2 23  --  22  GLUCOSE 144*  --  129*  BUN 34*  --  33*  CREATININE 1.70*  --  1.57*  CALCIUM 9.8  --  9.4  MG  --  2.2  --     Liver Function Tests: Recent Labs  Lab 05/29/19 1600  AST 18  ALT 10  ALKPHOS 39  BILITOT 1.0  PROT 7.1  ALBUMIN 4.0   No results for input(s): LIPASE, AMYLASE in the last 168 hours. No results  for input(s): AMMONIA in the last 168 hours.  CBC: Recent Labs  Lab 05/29/19 1600 05/30/19 0344  WBC 15.3* 11.9*  HGB 16.8 15.5  HCT 49.2 46.9  MCV 93.7 95.1  PLT 256 216    Cardiac Enzymes: Recent Labs  Lab 05/29/19 1600 05/29/19 2152 05/30/19 0344 05/30/19 0955  TROPONINI 0.06* 0.08* 0.06* 0.05*    BNP: Invalid input(s): POCBNP  CBG: No results for input(s): GLUCAP in the last 168 hours.  Microbiology: Results for orders placed or performed during the hospital encounter of 05/29/19  SARS Coronavirus 2 (CEPHEID- Performed in [facility] hospital lab), Hosp Order     Status: None   Collection Time: 05/29/19  4:23 PM   Specimen: Nasopharyngeal Swab  Result Value Ref Range Status   SARS Coronavirus 2 NEGATIVE NEGATIVE Final    Comment: (NOTE) If result is NEGATIVE SARS-CoV-2 target nucleic acids are NOT DETECTED. The SARS-CoV-2 RNA is generally detectable in upper and lower  respiratory specimens during the acute phase of infection. The lowest  concentration of SARS-CoV-2 viral copies this assay can detect is 250  copies / mL. A negative result does not preclude SARS-CoV-2 infection  and should not be used as the sole basis for treatment or other  patient management decisions.  A negative result may occur with  improper specimen collection / handling, submission of specimen other  than nasopharyngeal swab, presence of viral mutation(s) within the  areas targeted by this assay, and inadequate number of viral copies  (<250 copies / mL). A negative result must be combined with clinical  observations, patient history, and epidemiological information. If result is POSITIVE SARS-CoV-2 target nucleic acids are DETECTED. The SARS-CoV-2 RNA is generally detectable in upper and lower  respiratory specimens dur ing the acute phase of infection.  Positive  results are indicative of active infection with SARS-CoV-2.  Clinical  correlation with patient history and other  diagnostic information is  necessary to determine patient infection status.  Positive results do  not rule out bacterial infection or co-infection with other viruses. If result is PRESUMPTIVE POSTIVE SARS-CoV-2 nucleic acids MAY BE PRESENT.   A presumptive positive result was obtained on the submitted specimen  and confirmed on repeat testing.  While 2019 novel coronavirus  (SARS-CoV-2) nucleic acids may be present in the submitted sample  additional confirmatory testing may be necessary for epidemiological  and / or clinical management purposes  to differentiate between  SARS-CoV-2 and other Sarbecovirus currently known to infect humans.  If clinically indicated additional testing with an alternate test  methodology [phone]) is advised. The SARS-CoV-2 RNA is generally  detectable in upper and lower respiratory sp ecimens during the acute  phase of infection. The expected result is Negative. Fact Sheet for Patients:  StrictlyIdeas.no Fact Sheet for Healthcare Providers: BankingDealers.co.za This test is not yet approved or cleared by the [place] FDA and has been authorized for detection and/or diagnosis of SARS-CoV-2 by FDA under an Emergency Use Authorization (EUA).  This EUA will remain in effect (meaning this test can be used) for the duration of the COVID-19 declaration under Section 564(b)(1) of the Act, 21 U.S.C. section 360bbb-3(b)(1), unless the authorization is terminated or revoked sooner. Performed at [facility], [address] [place], [place] [address]   Blood Culture (routine x 2)     Status: None (Preliminary result)   Collection Time: 05/29/19  6:07 PM   Specimen: BLOOD  Result Value Ref Range Status   Specimen Description BLOOD BLOOD LEFT HAND  Final   Special Requests   Final    BOTTLES DRAWN AEROBIC AND ANAEROBIC Blood Culture results may not be optimal due to an excessive volume of blood received  in culture bottles   Culture   Final    NO GROWTH < 24 HOURS Performed at [facility], [address] [place], [place] [address]    Report Status PENDING  Incomplete  Blood Culture (routine x 2)     Status: None (Preliminary result)   Collection Time: 05/29/19  6:09 PM   Specimen: BLOOD  Result Value Ref Range Status   Specimen Description BLOOD BLOOD RIGHT ARM  Final   Special Requests   Final    BOTTLES DRAWN AEROBIC AND ANAEROBIC Blood Culture results may not be optimal due to an excessive volume of blood received in culture bottles   Culture   Final    NO GROWTH < 24 HOURS Performed at [facility], [address], [place], [place] [address]    Report Status PENDING  Incomplete    Coagulation Studies: Recent Labs    05/29/19 1945  LABPROT 15.0  INR 1.2    Urinalysis: No results for input(s): COLORURINE, LABSPEC, [place], [facility], [place], [place], [place], PROTEINUR, [place], NITRITE, LEUKOCYTESUR  in the last 72 hours.  Invalid input(s): APPERANCEUR    Imaging: Dg Chest Portable 1 View  Result Date: 05/29/2019 CLINICAL DATA:  Low back pain.  Shortness of breath. EXAM: PORTABLE CHEST 1 VIEW COMPARISON:  None. FINDINGS: Mild opacity in the lateral right lung base. The heart, hila, mediastinum, lungs, and pleura are otherwise normal. IMPRESSION: Mild opacity in the lateral right lung base may represent infiltrate or atelectasis. Recommend short-term follow-up to ensure resolution. Electronically Signed   By: Dorise Bullion III M.D   On: 05/29/2019 16:45     Medications:   . sodium chloride    . azithromycin    . cefTRIAXone (ROCEPHIN)  IV    . heparin 1,450 Units/hr (05/30/19 1317)   . aspirin EC  81 mg Oral Daily  . atorvastatin  40 mg Oral q1800  . carbidopa-levodopa  2 tablet Oral TID  . cholecalciferol  500 Units Oral Daily  . fenofibrate  160 mg Oral Daily  . oxybutynin  5 mg Oral Daily  . pantoprazole (PROTONIX) IV  40 mg  Intravenous Q24H  . sodium chloride flush  3 mL Intravenous Once  . sodium chloride flush  3 mL Intravenous Q12H   sodium chloride, acetaminophen, ipratropium-albuterol, nitroGLYCERIN, ondansetron (ZOFRAN) IV, sodium chloride flush  Assessment/ Plan:  [name] is a 78 y.o. white male with hyperlipidemia, hypertension, BPH, GERD, history of elevated uric acid level, chronic dizziness and imbalance   1. Chronic kidney disease stage III. Creatinine at baseline. Bland urine.  - recommend IV fluids 12-24 hour before the CTA and then 12 hours afterwards. Will start normal saline infusion at 160mL/hr - Hold ACE-I/ARB and diuretics  2. Hypertension. On no blood pressure agents recently.   3. Secondary hyperparathyroidism. Most recent PTH was 60. Not currently on vitamin D agents.    LOS: 1 Tekelia Kareem 6/18/20204:48 PM

## 2019-05-30 NOTE — Progress Notes (Signed)
Advance care planning  Purpose of Encounter Pneumonia  Parties in Attendance Patient  Patients Decisional capacity Alert and oriented.  Able to make medical decisions.  His healthcare power of attorney is his wife Brendan Nguyen  Discussed in detail regarding pneumonia.  Treatment plan , prognosis discussed.  All questions answered  CODE STATUS discussed and patient wishes for aggressive medical care but no intubation/CPR/defibrillation.  Orders entered and CODE STATUS changed  DNR/DNI  Time spent - 17 minutes

## 2019-05-30 NOTE — Progress Notes (Signed)
Dr. Nyoka Cowden from radiology called and made this nurse aware of CT angio results. NP Seals made aware. [place] of lower extremities ordered. Pt educated on procedures and situation. Pt has 7/10 back pain but otherwise has no distress. Pt stated that he wanted his code status changed from DNR to full. Pt educated on code status and he verbalized to this nurse that he would like to be resuscitated and that if needed, he was not opposed to being on life support in the ICU. NP Seals made aware, code status changed to full code.

## 2019-05-30 NOTE — Progress Notes (Signed)
ANTICOAGULATION CONSULT NOTE - Initial Consult  Pharmacy Consult for Heparin  Indication: chest pain/ACS  No Known Allergies  Patient Measurements: Height: 5\' 8"  (172.7 cm) Weight: 192 lb 3.2 oz (87.2 kg) IBW/kg (Calculated) : 68.4 Heparin Dosing Weight: 87.1 kg   Vital Signs: Temp: 98.4 F (36.9 C) (06/18 2021) Temp Source: Oral (06/18 2021) BP: 138/78 (06/18 2021) Pulse Rate: 69 (06/18 2021)  Labs: Recent Labs    05/29/19 1600 05/29/19 1945 05/29/19 2152 05/30/19 0056 05/30/19 0344 05/30/19 0955 05/30/19 2102  HGB 16.8  --   --   --  15.5  --   --   HCT 49.2  --   --   --  46.9  --   --   PLT 256  --   --   --  216  --   --   APTT  --  31  --   --   --   --   --   LABPROT  --  15.0  --   --   --   --   --   INR  --  1.2  --   --   --   --   --   HEPARINUNFRC  --   --   --  <0.10*  --  <0.10* 0.21*  CREATININE 1.70*  --   --   --  1.57*  --   --   TROPONINI 0.06*  --  0.08*  --  0.06* 0.05*  --     Estimated Creatinine Clearance: 41 mL/min (A) (by C-G formula based on SCr of 1.57 mg/dL (H)).   Medical History: Past Medical History:  Diagnosis Date  . BPH (benign prostatic hyperplasia)   . CKD (chronic kidney disease), stage III ([place])   . Elevated blood uric acid level   . GERD (gastroesophageal reflux disease)   . History of echocardiogram    a.02/2016 Echo: EF 50-55%. Mild LVH. Mild AI. Triv MR/TR.  Marland Kitchen Hyperparathyroidism ([place])   . Hypertension   . Hypertriglyceridemia   . Mixed hyperlipidemia   . TIA (transient ischemic attack) 2013    Medications:  Medications Prior to Admission  Medication Sig Dispense Refill Last Dose  . carbidopa-levodopa (SINEMET IR) 25-100 MG tablet TAKE 3 TABLETS BY MOUTH 3 TIMES DAILY 270 tablet 11 05/28/2019 at Unknown time  . fenofibrate 160 MG tablet Take 160 mg by mouth daily.    05/28/2019 at Unknown time  . oxybutynin (DITROPAN-XL) 5 MG 24 hr tablet Take 1 tablet (5 mg total) by mouth daily. 30 tablet 11 05/28/2019 at  Unknown time  . VITAMIN D PO Take 400 Units by mouth daily.   05/28/2019 at Unknown time    Assessment: 78 y.o.malebelow listed past medical history presents the ER for progressive weakness and chest pain radiating through to his back that occurred suddenly on Monday. Pharmacy has been consulted for heparin drip initiation and monitoring for ACS/STEMI. Spoke with RN Mendel Ryder and heparin has been continuously running no pauses or breaks.    6/18 @0056  HL < 0.1 rebolus w/ heparin 2600 units IV x 1 and will increase rate to 1200 units/hr  6/18 @0955  HL < 0.1  Goal of Therapy:  Heparin level 0.3-0.7 units/ml Monitor platelets by anticoagulation protocol: Yes   Plan:  Heparin level subtherapeutic. Will rebolus w/ heparin 2600 units IV x 1 and will increase rate to 1450 units/hr and will recheck HL @ 2100, CBC check w/ am labs.  6/18:  HL @ 2100 = 0.21 Will order Heparin 1300 units IV X 1 and increase drip rate to 1600 units/hr. Will recheck HL 8 hrs after rate change on 06/19 @ 0500.   Shacora Zynda D 05/30/2019,11:51 PM

## 2019-05-30 NOTE — Progress Notes (Addendum)
[facility] [place] NAME: Brendan Nguyen    MR#:  [id number]  DATE OF BIRTH:  1941/11/29  SUBJECTIVE:  CHIEF COMPLAINT:   Chief Complaint  Patient presents with  . Back Pain  Shortness of breath present  REVIEW OF SYSTEMS:    Review of Systems  Constitutional: Negative for chills and fever.  HENT: Negative for sore throat.   Eyes: Negative for blurred vision, double vision and pain.  Respiratory: Positive for shortness of breath. Negative for cough, hemoptysis and wheezing.   Cardiovascular: Negative for chest pain, palpitations, orthopnea and leg swelling.  Gastrointestinal: Negative for abdominal pain, constipation, diarrhea, heartburn, nausea and vomiting.  Genitourinary: Negative for dysuria and hematuria.  Musculoskeletal: Negative for back pain and joint pain.  Skin: Negative for rash.  Neurological: Negative for sensory change, speech change, focal weakness and headaches.  Endo/Heme/Allergies: Does not bruise/bleed easily.  Psychiatric/Behavioral: Negative for depression. The patient is not nervous/anxious.     DRUG ALLERGIES:  No Known Allergies  VITALS:  Blood pressure 133/73, pulse 86, temperature 97.7 F (36.5 C), temperature source Oral, resp. rate 20, height 5\' 8"  (1.727 m), weight 87.2 kg, SpO2 93 %.  PHYSICAL EXAMINATION:   Physical Exam  GENERAL:  78 y.o.-year-old patient lying in the bed with no acute distress.  EYES: Pupils equal, round, reactive to light and accommodation. No scleral icterus. Extraocular muscles intact.  HEENT: Head atraumatic, normocephalic. Oropharynx and nasopharynx clear.  NECK:  Supple, no jugular venous distention. No thyroid enlargement, no tenderness.  LUNGS: Normal breath sounds bilaterally, no wheezing, rales, rhonchi. No use of accessory muscles of respiration.  CARDIOVASCULAR: S1, S2 normal. No murmurs, rubs, or gallops.  ABDOMEN: Soft, nontender, nondistended. Bowel sounds present.  No organomegaly or mass.  EXTREMITIES: No cyanosis, clubbing or edema b/l.    NEUROLOGIC: Cranial nerves II through XII are intact. No focal Motor or sensory deficits b/l.   PSYCHIATRIC: The patient is alert and oriented x 3.  SKIN: No obvious rash, lesion, or ulcer.   LABORATORY PANEL:   CBC Recent Labs  Lab 05/30/19 0344  WBC 11.9*  HGB 15.5  HCT 46.9  PLT 216   ------------------------------------------------------------------------------------------------------------------ Chemistries  Recent Labs  Lab 05/29/19 1600 05/30/19 0056 05/30/19 0344  NA 138  --  140  K 4.4  --  4.0  CL 104  --  108  CO2 23  --  22  GLUCOSE 144*  --  129*  BUN 34*  --  33*  CREATININE 1.70*  --  1.57*  CALCIUM 9.8  --  9.4  MG  --  2.2  --   AST 18  --   --   ALT 10  --   --   ALKPHOS 39  --   --   BILITOT 1.0  --   --    ------------------------------------------------------------------------------------------------------------------  Cardiac Enzymes Recent Labs  Lab 05/30/19 0955  TROPONINI 0.05*   ------------------------------------------------------------------------------------------------------------------  RADIOLOGY:  Dg Chest Portable 1 View  Result Date: 05/29/2019 CLINICAL DATA:  Low back pain.  Shortness of breath. EXAM: PORTABLE CHEST 1 VIEW COMPARISON:  None. FINDINGS: Mild opacity in the lateral right lung base. The heart, hila, mediastinum, lungs, and pleura are otherwise normal. IMPRESSION: Mild opacity in the lateral right lung base may represent infiltrate or atelectasis. Recommend short-term follow-up to ensure resolution. Electronically Signed   By: Dorise Bullion III M.D   On: 05/29/2019 16:45     ASSESSMENT  AND PLAN:   *Right lower lobe community-acquired pneumonia On IV ceftriaxone and azithromycin.  Culture sent and pending. Acute hypoxic respiratory failure secondary to pneumonia.  Wean oxygen as tolerated  *Elevated troponin.  Likely demand  ischemia from pneumonia.  Echo pending.  Appreciate cardiology input.  Scheduled for stress test in the morning. No chest pain.  On heparin drip  * Diabetes mellitus - sliding scale insulin - Hemoglobin A1c  *  Hypertension Home medications  *  GERD - PPI   * CKD3 Able  All the records are reviewed and case discussed with Care Management/Social Worker Management plans discussed with the patient, family and they are in agreement.  CODE STATUS: FULL CODE  DVT Prophylaxis: On heparin drip  TOTAL TIME TAKING CARE OF THIS PATIENT: 25 minutes.   POSSIBLE D/C IN 1-2 DAYS, DEPENDING ON CLINICAL CONDITION.  Neita Carp M.D on 05/30/2019 at 11:20 AM  Between 7am to 6pm - Pager - [phone]  After 6pm go to [email or web address] - password [email or web address] [facility]  Office  [phone]  CC: Primary care physician; Dion Body, MD  Note: This dictation was prepared with Dragon dictation along with smaller phrase technology. Any transcriptional errors that result from this process are unintentional.

## 2019-05-31 ENCOUNTER — Other Ambulatory Visit (INDEPENDENT_AMBULATORY_CARE_PROVIDER_SITE_OTHER): Payer: Self-pay | Admitting: Vascular Surgery

## 2019-05-31 ENCOUNTER — Encounter
Admission: EM | Disposition: A | Discharge status: Home Health | Payer: Self-pay | Source: Home / Self Care | Attending: Internal Medicine

## 2019-05-31 ENCOUNTER — Ambulatory Visit: Payer: PPO | Attending: Nurse Practitioner

## 2019-05-31 DIAGNOSIS — I10 Essential (primary) hypertension: Secondary | ICD-10-CM

## 2019-05-31 DIAGNOSIS — I82401 Acute embolism and thrombosis of unspecified deep veins of right lower extremity: Secondary | ICD-10-CM

## 2019-05-31 DIAGNOSIS — E785 Hyperlipidemia, unspecified: Secondary | ICD-10-CM

## 2019-05-31 DIAGNOSIS — Z79899 Other long term (current) drug therapy: Secondary | ICD-10-CM

## 2019-05-31 DIAGNOSIS — I2602 Saddle embolus of pulmonary artery with acute cor pulmonale: Secondary | ICD-10-CM

## 2019-05-31 DIAGNOSIS — I2692 Saddle embolus of pulmonary artery without acute cor pulmonale: Secondary | ICD-10-CM

## 2019-05-31 DIAGNOSIS — I2699 Other pulmonary embolism without acute cor pulmonale: Secondary | ICD-10-CM

## 2019-05-31 HISTORY — PX: PULMONARY THROMBECTOMY: CATH118295

## 2019-05-31 LAB — HEPARIN LEVEL (UNFRACTIONATED)
Heparin Unfractionated: 0.35 IU/mL (ref 0.30–0.70)
Heparin Unfractionated: 0.1 IU/mL — ABNORMAL LOW (ref 0.30–0.70)
Heparin Unfractionated: 0.38 IU/mL (ref 0.30–0.70)

## 2019-05-31 LAB — BASIC METABOLIC PANEL
Anion gap: 7 (ref 5–15)
BUN: 30 mg/dL — ABNORMAL HIGH (ref 8–23)
CO2: 22 mmol/L (ref 22–32)
Calcium: 9 mg/dL (ref 8.9–10.3)
Chloride: 110 mmol/L (ref 98–111)
Creatinine, Ser: 1.51 mg/dL — ABNORMAL HIGH (ref 0.61–1.24)
GFR calc Af Amer: 50 mL/min — ABNORMAL LOW (ref >60)
GFR calc non Af Amer: 43 mL/min — ABNORMAL LOW (ref >60)
Glucose, Bld: 122 mg/dL — ABNORMAL HIGH (ref 70–99)
Potassium: 4 mmol/L (ref 3.5–5.1)
Sodium: 139 mmol/L (ref 135–145)

## 2019-05-31 LAB — CBC
HCT: 42.4 % (ref 39.0–52.0)
Hemoglobin: 14.3 g/dL (ref 13.0–17.0)
MCH: 31.3 pg (ref 26.0–34.0)
MCHC: 33.7 g/dL (ref 30.0–36.0)
MCV: 92.8 fL (ref 80.0–100.0)
Platelets: 248 10*3/uL (ref 150–400)
RBC: 4.57 MIL/uL (ref 4.22–5.81)
RDW: 12.9 % (ref 11.5–15.5)
WBC: 10.8 10*3/uL — ABNORMAL HIGH (ref 4.0–10.5)
nRBC: 0 % (ref 0.0–0.2)

## 2019-05-31 LAB — HIV ANTIBODY (ROUTINE TESTING W REFLEX): HIV Screen 4th Generation wRfx: NONREACTIVE

## 2019-05-31 SURGERY — PULMONARY THROMBECTOMY
Anesthesia: Moderate Sedation

## 2019-05-31 MED ORDER — SODIUM CHLORIDE 0.9 % IV SOLN: INTRAVENOUS | Status: DC

## 2019-05-31 MED ORDER — CEFAZOLIN SODIUM-DEXTROSE 2-4 GM/100ML-% IV SOLN
2.0000 g | Freq: Once | INTRAVENOUS | Status: AC
  Administered 2019-05-31: 2 g via INTRAVENOUS

## 2019-05-31 MED ORDER — FENTANYL CITRATE (PF) 100 MCG/2ML IJ SOLN
INTRAMUSCULAR | Status: AC
  Filled 2019-05-31: qty 2

## 2019-05-31 MED ORDER — ONDANSETRON HCL 4 MG/2ML IJ SOLN: 4.0000 mg | Freq: Four times a day (QID) | INTRAMUSCULAR | Status: DC | PRN

## 2019-05-31 MED ORDER — MIDAZOLAM HCL 2 MG/2ML IJ SOLN
INTRAMUSCULAR | Status: DC | PRN
  Administered 2019-05-31: 0.5 mg via INTRAVENOUS
  Administered 2019-05-31 (×2): 1 mg via INTRAVENOUS

## 2019-05-31 MED ORDER — HEPARIN SODIUM (PORCINE) 1000 UNIT/ML IJ SOLN
INTRAMUSCULAR | Status: AC
  Filled 2019-05-31: qty 1

## 2019-05-31 MED ORDER — ALTEPLASE 2 MG IJ SOLR
INTRAMUSCULAR | Status: DC | PRN
  Administered 2019-05-31: 20 mg

## 2019-05-31 MED ORDER — IODIXANOL 320 MG/ML IV SOLN
INTRAVENOUS | Status: DC | PRN
  Administered 2019-05-31: 85 mL via INTRAVENOUS

## 2019-05-31 MED ORDER — FENTANYL CITRATE (PF) 100 MCG/2ML IJ SOLN
INTRAMUSCULAR | Status: DC | PRN
  Administered 2019-05-31: 12.5 ug via INTRAVENOUS
  Administered 2019-05-31: 50 ug via INTRAVENOUS
  Administered 2019-05-31: 12.5 ug via INTRAVENOUS

## 2019-05-31 MED ORDER — FAMOTIDINE 20 MG PO TABS: 40.0000 mg | ORAL_TABLET | Freq: Once | ORAL | Status: DC | PRN

## 2019-05-31 MED ORDER — HEPARIN SODIUM (PORCINE) 1000 UNIT/ML IJ SOLN
INTRAMUSCULAR | Status: DC | PRN
  Administered 2019-05-31: 2000 [IU] via INTRAVENOUS

## 2019-05-31 MED ORDER — MIDAZOLAM HCL 5 MG/5ML IJ SOLN
INTRAMUSCULAR | Status: AC
  Filled 2019-05-31: qty 5

## 2019-05-31 MED ORDER — DIPHENHYDRAMINE HCL 50 MG/ML IJ SOLN: 50.0000 mg | Freq: Once | INTRAMUSCULAR | Status: DC | PRN

## 2019-05-31 MED ORDER — HYDROMORPHONE HCL 1 MG/ML IJ SOLN: 1.0000 mg | Freq: Once | INTRAMUSCULAR | Status: DC | PRN

## 2019-05-31 MED ORDER — MIDAZOLAM HCL 2 MG/ML PO SYRP: 8.0000 mg | ORAL_SOLUTION | Freq: Once | ORAL | Status: DC | PRN

## 2019-05-31 MED ORDER — HEPARIN BOLUS VIA INFUSION
2600.0000 [IU] | Freq: Once | INTRAVENOUS | Status: AC
  Administered 2019-05-31: 2600 [IU] via INTRAVENOUS
  Filled 2019-05-31: qty 2600

## 2019-05-31 MED ORDER — METHYLPREDNISOLONE SODIUM SUCC 125 MG IJ SOLR: 125.0000 mg | Freq: Once | INTRAMUSCULAR | Status: DC | PRN

## 2019-05-31 SURGICAL SUPPLY — 20 items
CANISTER PENUMBRA ENGINE (MISCELLANEOUS) ×2 IMPLANT
CATH ANGIO 5F 100CM .035 PIG (CATHETERS) ×2 IMPLANT
CATH BEACON 5 .035 65 C2 TIP (CATHETERS) ×2 IMPLANT
CATH GROLLMAN 7FR 100CM (CATHETERS) IMPLANT
CATH INDIGO CAT8 TORQ 85 KIT (CATHETERS) ×2 IMPLANT
CATH INFINITI 5FR ANG PIGTAIL (CATHETERS) ×2 IMPLANT
CATH VISTA GUIDE 6FR JR4 (CATHETERS) ×2 IMPLANT
DEVICE SAFEGUARD 24CM (GAUZE/BANDAGES/DRESSINGS) ×2 IMPLANT
DEVICE TORQUE .025-.038 (MISCELLANEOUS) ×2 IMPLANT
GLIDEWIRE STIFF .35X180X3 HYDR (WIRE) ×2 IMPLANT
NDL ENTRY 21GA 7CM ECHOTIP (NEEDLE) IMPLANT
NEEDLE ENTRY 21GA 7CM ECHOTIP (NEEDLE) ×3 IMPLANT
PACK ANGIOGRAPHY (CUSTOM PROCEDURE TRAY) ×3 IMPLANT
SET INTRO CAPELLA COAXIAL (SET/KITS/TRAYS/PACK) ×2 IMPLANT
SHEATH 9FRX11 (SHEATH) ×2 IMPLANT
SHEATH DESTINIATION 65 8FR (SHEATH) ×2 IMPLANT
SYR MEDRAD MARK 7 150ML (SYRINGE) ×2 IMPLANT
TUBING CONTRAST HIGH PRESS 72 (TUBING) ×3 IMPLANT
WIRE AQUATRAK .035X260 ANG (WIRE) ×2 IMPLANT
WIRE J 3MM .035X145CM (WIRE) ×3 IMPLANT

## 2019-05-31 NOTE — Progress Notes (Signed)
Called and updated wife Dorinda Hill

## 2019-05-31 NOTE — Progress Notes (Signed)
ANTICOAGULATION CONSULT NOTE - Initial Consult  Pharmacy Consult for Heparin  Indication: chest pain/ACS  No Known Allergies  Patient Measurements: Height: 5\' 8"  (172.7 cm) Weight: 192 lb 3.2 oz (87.2 kg) IBW/kg (Calculated) : 68.4 Heparin Dosing Weight: 87.1 kg   Vital Signs: Temp: 98.7 F (37.1 C) (06/19 0554) Temp Source: Oral (06/19 0554) BP: 129/85 (06/19 0554) Pulse Rate: 59 (06/19 0554)  Labs: Recent Labs    05/29/19 1600 05/29/19 1945 05/29/19 2152  05/30/19 0344 05/30/19 0955 05/30/19 2102 05/31/19 0559  HGB 16.8  --   --   --  15.5  --   --  14.3  HCT 49.2  --   --   --  46.9  --   --  42.4  PLT 256  --   --   --  216  --   --  248  APTT  --  31  --   --   --   --   --   --   LABPROT  --  15.0  --   --   --   --   --   --   INR  --  1.2  --   --   --   --   --   --   HEPARINUNFRC  --   --   --    < >  --  <0.10* 0.21* 0.35  CREATININE 1.70*  --   --   --  1.57*  --   --  1.51*  TROPONINI 0.06*  --  0.08*  --  0.06* 0.05*  --   --    < > = values in this interval not displayed.    Estimated Creatinine Clearance: 42.6 mL/min (A) (by C-G formula based on SCr of 1.51 mg/dL (H)).   Medical History: Past Medical History:  Diagnosis Date  . BPH (benign prostatic hyperplasia)   . CKD (chronic kidney disease), stage III ([place])   . Elevated blood uric acid level   . GERD (gastroesophageal reflux disease)   . History of echocardiogram    a.02/2016 Echo: EF 50-55%. Mild LVH. Mild AI. Triv MR/TR.  Marland Kitchen Hyperparathyroidism ([place])   . Hypertension   . Hypertriglyceridemia   . Mixed hyperlipidemia   . TIA (transient ischemic attack) 2013    Medications:  Medications Prior to Admission  Medication Sig Dispense Refill Last Dose  . carbidopa-levodopa (SINEMET IR) 25-100 MG tablet TAKE 3 TABLETS BY MOUTH 3 TIMES DAILY 270 tablet 11 05/28/2019 at Unknown time  . fenofibrate 160 MG tablet Take 160 mg by mouth daily.    05/28/2019 at Unknown time  . oxybutynin  (DITROPAN-XL) 5 MG 24 hr tablet Take 1 tablet (5 mg total) by mouth daily. 30 tablet 11 05/28/2019 at Unknown time  . VITAMIN D PO Take 400 Units by mouth daily.   05/28/2019 at Unknown time    Assessment: 78 y.o.malebelow listed past medical history presents the ER for progressive weakness and chest pain radiating through to his back that occurred suddenly on Monday. Pharmacy has been consulted for heparin drip initiation and monitoring for ACS/STEMI. Spoke with RN Mendel Ryder and heparin has been continuously running no pauses or breaks.    6/18 @0056  HL < 0.1 rebolus w/ heparin 2600 units IV x 1 and will increase rate to 1200 units/hr  6/18 @0955  HL < 0.1 6/18 @0600  HL 0.35  Goal of Therapy:  Heparin level 0.3-0.7 units/ml Monitor platelets by anticoagulation protocol: Yes  Plan:  Heparin level is therapeutic. Will continue with current rate of 1600 units/hr and will recheck HL in 6 hours, CBC check w/ am labs.  Paulina Fusi, PharmD, BCPS 05/31/2019 6:47 AM

## 2019-05-31 NOTE — Progress Notes (Signed)
Per Dr. Delana Meyer Heparin to restart at 5pm at same rate 1800 units/hr, No bolus.

## 2019-05-31 NOTE — Progress Notes (Signed)
[facility] [place] NAME: Brendan Nguyen    MR#:  [id number]  DATE OF BIRTH:  08-15-1941  SUBJECTIVE:  CHIEF COMPLAINT:   Chief Complaint  Patient presents with  . Back Pain   Mild shortness of breath.  No chest pain.  REVIEW OF SYSTEMS:    Review of Systems  Constitutional: Positive for malaise/fatigue. Negative for chills, fever and weight loss.  HENT: Negative for hearing loss and nosebleeds.   Eyes: Negative for blurred vision, double vision and pain.  Respiratory: Positive for shortness of breath. Negative for cough, hemoptysis, sputum production and wheezing.   Cardiovascular: Negative for chest pain, palpitations, orthopnea and leg swelling.  Gastrointestinal: Negative for abdominal pain, constipation, diarrhea, nausea and vomiting.  Genitourinary: Negative for dysuria and hematuria.  Musculoskeletal: Negative for back pain, falls and myalgias.  Skin: Negative for rash.  Neurological: Negative for dizziness, tremors, sensory change, speech change, focal weakness, seizures and headaches.  Endo/Heme/Allergies: Does not bruise/bleed easily.  Psychiatric/Behavioral: Negative for depression and memory loss. The patient is not nervous/anxious.     DRUG ALLERGIES:  No Known Allergies  VITALS:  Blood pressure (!) 141/81, pulse 68, temperature 97.7 F (36.5 C), temperature source Oral, resp. rate 20, height 5\' 8"  (1.727 m), weight 87.2 kg, SpO2 96 %.  PHYSICAL EXAMINATION:   Physical Exam  GENERAL:  78 y.o.-year-old patient lying in the bed with no acute distress.  EYES: Pupils equal, round, reactive to light and accommodation. No scleral icterus. Extraocular muscles intact.  HEENT: Head atraumatic, normocephalic. Oropharynx and nasopharynx clear.  NECK:  Supple, no jugular venous distention. No thyroid enlargement, no tenderness.  LUNGS: Normal breath sounds bilaterally, no wheezing, rales, rhonchi. No use of accessory muscles of  respiration.  CARDIOVASCULAR: S1, S2 normal. No murmurs, rubs, or gallops.  ABDOMEN: Soft, nontender, nondistended. Bowel sounds present. No organomegaly or mass.  EXTREMITIES: No cyanosis, clubbing or edema b/l.    NEUROLOGIC: Cranial nerves II through XII are intact. No focal Motor or sensory deficits b/l.   PSYCHIATRIC: The patient is alert and oriented x 3.  SKIN: No obvious rash, lesion, or ulcer.   LABORATORY PANEL:   CBC Recent Labs  Lab 05/31/19 0559  WBC 10.8*  HGB 14.3  HCT 42.4  PLT 248   ------------------------------------------------------------------------------------------------------------------ Chemistries  Recent Labs  Lab 05/29/19 1600 05/30/19 0056  05/31/19 0559  NA 138  --    < > 139  K 4.4  --    < > 4.0  CL 104  --    < > 110  CO2 23  --    < > 22  GLUCOSE 144*  --    < > 122*  BUN 34*  --    < > 30*  CREATININE 1.70*  --    < > 1.51*  CALCIUM 9.8  --    < > 9.0  MG  --  2.2  --   --   AST 18  --   --   --   ALT 10  --   --   --   ALKPHOS 39  --   --   --   BILITOT 1.0  --   --   --    < > = values in this interval not displayed.   ------------------------------------------------------------------------------------------------------------------  Cardiac Enzymes Recent Labs  Lab 05/30/19 0955  TROPONINI 0.05*   ------------------------------------------------------------------------------------------------------------------  RADIOLOGY:  Ct Angio Chest Pe W Or Wo  Contrast  Addendum Date: 05/30/2019   ADDENDUM REPORT: 05/30/2019 19:49 ADDENDUM: These results were called by telephone at the time of interpretation on 05/30/2019 at 7:48 pm to RN Valere Dross, who verbally acknowledged these results. Multiple attempts were made by the reading physician and PRA to contact the ordering physician. These attempts were unsuccessful. RN endorsed understanding that this is a critical finding and large pulmonary embolus requiring immediate action.  Electronically Signed   By: Constance Holster M.D.   On: 05/30/2019 19:49   Result Date: 05/30/2019 CLINICAL DATA:  Acute respiratory failure. EXAM: CT ANGIOGRAPHY CHEST WITH CONTRAST TECHNIQUE: Multidetector CT imaging of the chest was performed using the standard protocol during bolus administration of intravenous contrast. Multiplanar CT image reconstructions and MIPs were obtained to evaluate the vascular anatomy. CONTRAST:  60mL OMNIPAQUE IOHEXOL 350 MG/ML SOLN COMPARISON:  None. FINDINGS: Cardiovascular: There is a large saddle pulmonary embolus with extensive pulmonary emboli involving the lobar, segmental, and subsegmental branches of virtually all lobes. There is right heart strain with an RV/LV ratio measuring approximately 1.3. The heart size is enlarged. There is reflux of contrast in the IVC consistent with right heart strain/failure Mediastinum/Nodes: No enlarged mediastinal, hilar, or axillary lymph nodes. Thyroid gland, trachea, and esophagus demonstrate no significant findings. Lungs/Pleura: There is atelectasis at the right lung base. There is a trace right-sided pleural effusion. There is an airspace opacity in the right middle lobe. There is no pneumothorax. The trachea is unremarkable. Upper Abdomen: There is cholelithiasis without CT evidence of acute cholecystitis involving the partially visualized gallbladder. There appears to be underlying hepatic steatosis. Musculoskeletal: No chest wall abnormality. No acute or significant osseous findings. Review of the MIP images confirms the above findings. IMPRESSION: 1. Extensive acute bilateral pulmonary emboli as detailed above including a saddle pulmonary embolus. There is CT evidence of right heart strain. Positive for acute PE with CT evidence of right heart strain (RV/LV Ratio = 1.3) consistent with at least submassive (intermediate risk) PE. The presence of right heart strain has been associated with an increased risk of morbidity and  mortality. 2. Airspace opacity in the right middle lobe may represent infiltrate or developing pulmonary infarct in the setting of acute pulmonary emboli. 3. Trace right-sided pleural effusion. 4. Cardiomegaly. 5. Hepatic steatosis. 6. Cholelithiasis. Electronically Signed: By: Constance Holster M.D. On: 05/30/2019 19:27   US Venous Img Lower Bilateral  Result Date: 05/30/2019 CLINICAL DATA:  Bilateral pulmonary emboli EXAM: BILATERAL LOWER EXTREMITY VENOUS DOPPLER ULTRASOUND TECHNIQUE: Gray-scale sonography with graded compression, as well as color Doppler and duplex ultrasound were performed to evaluate the lower extremity deep venous systems from the level of the common femoral vein and including the common femoral, femoral, profunda femoral, popliteal and calf veins including the posterior tibial, peroneal and gastrocnemius veins when visible. The superficial great saphenous vein was also interrogated. Spectral Doppler was utilized to evaluate flow at rest and with distal augmentation maneuvers in the common femoral, femoral and popliteal veins. COMPARISON:  None. FINDINGS: RIGHT LOWER EXTREMITY Common Femoral Vein: No evidence of thrombus. Normal compressibility, respiratory phasicity and response to augmentation. Saphenofemoral Junction: No evidence of thrombus. Normal compressibility and flow on color Doppler imaging. Profunda Femoral Vein: No evidence of thrombus. Normal compressibility and flow on color Doppler imaging. Femoral Vein: Occlusive thrombus noted in the distal femoral vein. Distal vessel is noncompressible. Popliteal Vein: Occlusive thrombus noted in the right popliteal vein. Calf Veins: Occlusive thrombus noted within the posterior tibial veins and 1 peroneal vein.  Superficial Great Saphenous Vein: No evidence of thrombus. Normal compressibility. Venous Reflux:  None. Other Findings:  None. LEFT LOWER EXTREMITY Common Femoral Vein: No evidence of thrombus. Normal compressibility,  respiratory phasicity and response to augmentation. Saphenofemoral Junction: No evidence of thrombus. Normal compressibility and flow on color Doppler imaging. Profunda Femoral Vein: No evidence of thrombus. Normal compressibility and flow on color Doppler imaging. Femoral Vein: No evidence of thrombus. Normal compressibility, respiratory phasicity and response to augmentation. Popliteal Vein: No evidence of thrombus. Normal compressibility, respiratory phasicity and response to augmentation. Calf Veins: No evidence of thrombus. Normal compressibility and flow on color Doppler imaging. Superficial Great Saphenous Vein: No evidence of thrombus. Normal compressibility. Venous Reflux:  None. Other Findings:  None. IMPRESSION: Occlusive thrombus within the right distal femoral vein, popliteal vein and calf veins. Electronically Signed   By: Rolm Baptise M.D.   On: 05/30/2019 23:55   Dg Chest Portable 1 View  Result Date: 05/29/2019 CLINICAL DATA:  Low back pain.  Shortness of breath. EXAM: PORTABLE CHEST 1 VIEW COMPARISON:  None. FINDINGS: Mild opacity in the lateral right lung base. The heart, hila, mediastinum, lungs, and pleura are otherwise normal. IMPRESSION: Mild opacity in the lateral right lung base may represent infiltrate or atelectasis. Recommend short-term follow-up to ensure resolution. Electronically Signed   By: Dorise Bullion III M.D   On: 05/29/2019 16:45     ASSESSMENT AND PLAN:   *Bilateral pulmonary emboli with saddle embolus.  Right lower extremity DVT.  Consulted vascular surgery.  Presently on heparin drip.  Thrombolysis of the pulmonary embolism planned.  Will need Eliquis at discharge.  *Right lower lobe community-acquired pneumonia On IV ceftriaxone and azithromycin.  Culture sent and pending. Acute hypoxic respiratory failure secondary to pneumonia.  Wean oxygen as tolerated  *Elevated troponin secondary to pulmonary embolism.  Stable  * Diabetes mellitus - sliding scale  insulin - Hemoglobin A1c  *  Hypertension Home medications  *  GERD - PPI   * CKD3 Stable  All the records are reviewed and case discussed with Care Management/Social Worker Management plans discussed with the patient, family and they are in agreement.  CODE STATUS: FULL CODE  DVT Prophylaxis: On heparin drip  TOTAL TIME TAKING CARE OF THIS PATIENT: 25 minutes.   POSSIBLE D/C IN 1-2 DAYS, DEPENDING ON CLINICAL CONDITION.  Neita Carp M.D on 05/31/2019 at 12:34 PM  Between 7am to 6pm - Pager - [phone]  After 6pm go to [email or web address] - password [email or web address] [facility]  Office  [phone]  CC: Primary care physician; Dion Body, MD  Note: This dictation was prepared with Dragon dictation along with smaller phrase technology. Any transcriptional errors that result from this process are unintentional.

## 2019-05-31 NOTE — Op Note (Signed)
[facility]  Percutaneous Study/Intervention Procedural Note   Date of Surgery: 05/31/2019,4:18 PM  Surgeon:Schnier, Dolores Lory   Pre-operative Diagnosis: Symptomatic bilateral saddle pulmonary emboli  Post-operative diagnosis:  Same  Procedure(s) Performed:  1.  Contrast injection right heart  2.  Thrombolysis with 20 mg total bilateral main pulmonary arteries  3.  Mechanical thrombectomy bilateral main pulmonary arteries  4.  Selective catheter placement right main pulmonary artery  5.  Selective catheter placement left main pulmonary artery    Anesthesia: Conscious sedation was administered under my direct supervision by the interventional radiology RN. IV Versed plus fentanyl were utilized. Continuous ECG, pulse oximetry and blood pressure was monitored throughout the entire procedure.  Versed and fentanyl were administered intravenously.  Conscious sedation was administered for a total of 40 minutes.  Sheath: 6 [place] destination right common femoral vein antegrade  Contrast: 85 cc   Fluoroscopy Time: 12.4 minutes  Indications:  Patient presents with pulmonary emboli. The patient is symptomatic with hypoxemia and dyspnea on exertion.  There is evidence of right heart strain on the CT angiogram. The patient is otherwise a good candidate for intervention and even the long-term benefits pulmonary angiography with thrombolysis is offered. The risks and benefits are reviewed long-term benefits are discussed. All questions are answered patient agrees to proceed.  Procedure:  Brendan Nguyen a 78 y.o. male who was identified and appropriate procedural time out was performed.  The patient was then placed supine on the table and prepped and draped in the usual sterile fashion.  Ultrasound was used to evaluate the right common femoral vein.  It was patent, as it was echolucent and compressible.  A digital ultrasound image was acquired for the permanent record.  A  micropuncture needle was used to access the right common femoral vein under direct ultrasound guidance.  A microwire was then advanced under fluoroscopic guidance followed by micro-sheath.  A 0.035 J wire was advanced without resistance and a 5Fr sheath was placed and then upsized to an 8 [place] 11 cm sheath once the pulmonary artery had been selected the 11 cm sheath was exchanged for an 8 French 65 cm destination sheath.    The wire and pigtail catheter were then negotiated into the right atrium and bolus injection of contrast was utilized to demonstrate the right ventricle and the pulmonary artery outflow. The wire and catheter were then negotiated into the main pulmonary artery where hand injection of contrast was utilized to demonstrate the pulmonary arteries and confirm the locations of the pulmonary emboli.  2000 units of heparin was then given and allowed to circulate.  TPA was reconstituted and delivered onto the table. A total of 20 milligrams of TPA was utilized.  10 mg was administered on the left side and 10 mg was administered on the right side. This was then allowed to dwell for 20-30 minutes.  The Penumbra Cat 8 catheter was then advanced up into the pulmonary vasculature. The left lung was addressed first. Catheter was negotiated into the left distal main pulmonary artery and mechanical thrombectomy was performed. Follow-up imaging demonstrated a good result near-total elimination of the clot.  The Penumbra Cat 8 catheter was then negotiated to the opposite side. The right lung was addressed second. Catheter was negotiated into the distal right main and mechanical thrombectomy was performed. Follow-up imaging demonstrated a good result with essentially total and elimination of the clot.    Wire was then reintroduced and the pigtail catheter advanced  into the pulmonary artery main trunk and angiography was performed. After review these images wires reintroduced and the rectal catheters  removed the sheath is then pulled and pressures held. A safeguard is placed.    Findings:   Right heart imaging:  Right atrium and right ventricle and the pulmonary outflow tract appears normal  Right lung: Large burden of clot was noted in the midportion of the right main pulmonary artery.  After treatment with the penumbra catheter follow-up imaging demonstrated essentially total resolution of the thrombus.  Left lung: Large burden of clot was noted in the distal left main pulmonary artery.  Following treatment with the penumbra catheter there was essentially total resolution with elimination of the clot.  Summary: Successful elimination of the saddle pulmonary emboli bilateral main pulmonary arteries  Disposition: Patient was taken to the recovery room in stable condition having tolerated the procedure well.  Belenda Cruise Schnier 05/31/2019,4:18 PM

## 2019-05-31 NOTE — Care Management Important Message (Signed)
Important Message  Patient Details  Name: Brendan Nguyen MRN: [id number] Date of Birth: Jan 05, 1941   Medicare Important Message Given:  Yes  Initial Medicare IM given by Patient Access Associate on 05/30/2019 at 1045.  Still valid.   Dannette Barbara 05/31/2019, 9:26 AM

## 2019-05-31 NOTE — Progress Notes (Signed)
Progress Note  Patient Name: Brendan Nguyen Date of Encounter: 05/31/2019  Primary Cardiologist: Ida Rogue, MD  Subjective   No c/p or sob.  Denies leg pain or swelling.  Inpatient Medications    Scheduled Meds:  aspirin EC  81 mg Oral Daily   atorvastatin  40 mg Oral q1800   carbidopa-levodopa  2 tablet Oral TID   cholecalciferol  500 Units Oral Daily   fenofibrate  160 mg Oral Daily   oxybutynin  5 mg Oral Daily   pantoprazole (PROTONIX) IV  40 mg Intravenous Q24H   sodium chloride flush  3 mL Intravenous Once   sodium chloride flush  3 mL Intravenous Q12H   Continuous Infusions:  sodium chloride     sodium chloride 100 mL/hr at 05/31/19 0708   azithromycin 500 mg (05/30/19 2141)   cefTRIAXone (ROCEPHIN)  IV 2 g (05/30/19 1806)   heparin 1,600 Units/hr (05/31/19 0319)   PRN Meds: sodium chloride, acetaminophen, ipratropium-albuterol, nitroGLYCERIN, ondansetron (ZOFRAN) IV, sodium chloride flush   Vital Signs    Vitals:   05/30/19 1616 05/30/19 2021 05/31/19 0554 05/31/19 0756  BP: 134/87 138/78 129/85 (!) 141/81  Pulse: 87 69 (!) 59 68  Resp:      Temp: 98.1 F (36.7 C) 98.4 F (36.9 C) 98.7 F (37.1 C) 97.7 F (36.5 C)  TempSrc: Oral Oral Oral Oral  SpO2: 91% 90% 95% 96%  Weight:      Height:        Intake/Output Summary (Last 24 hours) at 05/31/2019 0950 Last data filed at 05/31/2019 0601 Gross per 24 hour  Intake --  Output 300 ml  Net -300 ml   Filed Weights   05/29/19 1549 05/30/19 0024  Weight: 90.7 kg 87.2 kg    Physical Exam   GEN: Well nourished, well developed, in no acute distress.  HEENT: Grossly normal.  Neck: Supple, no JVD, carotid bruits, or masses. Cardiac: RRR, no murmurs, rubs, or gallops. No clubbing, cyanosis, edema.  Radials/DP/PT 2+ and equal bilaterally.  Respiratory:  Respirations regular and unlabored, clear to auscultation bilaterally. GI: Soft, nontender, nondistended, BS + x 4. MS: no deformity  or atrophy. Skin: warm and dry, no rash. Neuro:  Strength and sensation are intact. Psych: AAOx3.  Normal affect.  Labs    Chemistry Recent Labs  Lab 05/29/19 1600 05/30/19 0344 05/31/19 0559  NA 138 140 139  K 4.4 4.0 4.0  CL 104 108 110  CO2 23 22 22   GLUCOSE 144* 129* 122*  BUN 34* 33* 30*  CREATININE 1.70* 1.57* 1.51*  CALCIUM 9.8 9.4 9.0  PROT 7.1  --   --   ALBUMIN 4.0  --   --   AST 18  --   --   ALT 10  --   --   ALKPHOS 39  --   --   BILITOT 1.0  --   --   GFRNONAA 38* 41* 43*  GFRAA 43* 48* 50*  ANIONGAP 11 10 7      Hematology Recent Labs  Lab 05/29/19 1600 05/30/19 0344 05/31/19 0559  WBC 15.3* 11.9* 10.8*  RBC 5.25 4.93 4.57  HGB 16.8 15.5 14.3  HCT 49.2 46.9 42.4  MCV 93.7 95.1 92.8  MCH 32.0 31.4 31.3  MCHC 34.1 33.0 33.7  RDW 13.0 12.9 12.9  PLT 256 216 248    Cardiac Enzymes Recent Labs  Lab 05/29/19 1600 05/29/19 2152 05/30/19 0344 05/30/19 0955  TROPONINI 0.06* 0.08* 0.06*  0.05*     BNP Recent Labs  Lab 05/29/19 1600  BNP 716.0*      Radiology    Ct Angio Chest Pe W Or Wo Contrast  Addendum Date: 05/30/2019   ADDENDUM REPORT: 05/30/2019 19:49 ADDENDUM: These results were called by telephone at the time of interpretation on 05/30/2019 at 7:48 pm to RN Valere Dross, who verbally acknowledged these results. Multiple attempts were made by the reading physician and PRA to contact the ordering physician. These attempts were unsuccessful. RN endorsed understanding that this is a critical finding and large pulmonary embolus requiring immediate action. Electronically Signed   By: Constance Holster M.D.   On: 05/30/2019 19:49   Result Date: 05/30/2019 CLINICAL DATA:  Acute respiratory failure. EXAM: CT ANGIOGRAPHY CHEST WITH CONTRAST TECHNIQUE: Multidetector CT imaging of the chest was performed using the standard protocol during bolus administration of intravenous contrast. Multiplanar CT image reconstructions and MIPs were obtained to  evaluate the vascular anatomy. CONTRAST:  76mL OMNIPAQUE IOHEXOL 350 MG/ML SOLN COMPARISON:  None. FINDINGS: Cardiovascular: There is a large saddle pulmonary embolus with extensive pulmonary emboli involving the lobar, segmental, and subsegmental branches of virtually all lobes. There is right heart strain with an RV/LV ratio measuring approximately 1.3. The heart size is enlarged. There is reflux of contrast in the IVC consistent with right heart strain/failure Mediastinum/Nodes: No enlarged mediastinal, hilar, or axillary lymph nodes. Thyroid gland, trachea, and esophagus demonstrate no significant findings. Lungs/Pleura: There is atelectasis at the right lung base. There is a trace right-sided pleural effusion. There is an airspace opacity in the right middle lobe. There is no pneumothorax. The trachea is unremarkable. Upper Abdomen: There is cholelithiasis without CT evidence of acute cholecystitis involving the partially visualized gallbladder. There appears to be underlying hepatic steatosis. Musculoskeletal: No chest wall abnormality. No acute or significant osseous findings. Review of the MIP images confirms the above findings. IMPRESSION: 1. Extensive acute bilateral pulmonary emboli as detailed above including a saddle pulmonary embolus. There is CT evidence of right heart strain. Positive for acute PE with CT evidence of right heart strain (RV/LV Ratio = 1.3) consistent with at least submassive (intermediate risk) PE. The presence of right heart strain has been associated with an increased risk of morbidity and mortality. 2. Airspace opacity in the right middle lobe may represent infiltrate or developing pulmonary infarct in the setting of acute pulmonary emboli. 3. Trace right-sided pleural effusion. 4. Cardiomegaly. 5. Hepatic steatosis. 6. Cholelithiasis. Electronically Signed: By: Constance Holster M.D. On: 05/30/2019 19:27   US Venous Img Lower Bilateral  Result Date: 05/30/2019 CLINICAL DATA:   Bilateral pulmonary emboli EXAM: BILATERAL LOWER EXTREMITY VENOUS DOPPLER ULTRASOUND TECHNIQUE: Gray-scale sonography with graded compression, as well as color Doppler and duplex ultrasound were performed to evaluate the lower extremity deep venous systems from the level of the common femoral vein and including the common femoral, femoral, profunda femoral, popliteal and calf veins including the posterior tibial, peroneal and gastrocnemius veins when visible. The superficial great saphenous vein was also interrogated. Spectral Doppler was utilized to evaluate flow at rest and with distal augmentation maneuvers in the common femoral, femoral and popliteal veins. COMPARISON:  None. FINDINGS: RIGHT LOWER EXTREMITY Common Femoral Vein: No evidence of thrombus. Normal compressibility, respiratory phasicity and response to augmentation. Saphenofemoral Junction: No evidence of thrombus. Normal compressibility and flow on color Doppler imaging. Profunda Femoral Vein: No evidence of thrombus. Normal compressibility and flow on color Doppler imaging. Femoral Vein: Occlusive thrombus noted in the  distal femoral vein. Distal vessel is noncompressible. Popliteal Vein: Occlusive thrombus noted in the right popliteal vein. Calf Veins: Occlusive thrombus noted within the posterior tibial veins and 1 peroneal vein. Superficial Great Saphenous Vein: No evidence of thrombus. Normal compressibility. Venous Reflux:  None. Other Findings:  None. LEFT LOWER EXTREMITY Common Femoral Vein: No evidence of thrombus. Normal compressibility, respiratory phasicity and response to augmentation. Saphenofemoral Junction: No evidence of thrombus. Normal compressibility and flow on color Doppler imaging. Profunda Femoral Vein: No evidence of thrombus. Normal compressibility and flow on color Doppler imaging. Femoral Vein: No evidence of thrombus. Normal compressibility, respiratory phasicity and response to augmentation. Popliteal Vein: No evidence  of thrombus. Normal compressibility, respiratory phasicity and response to augmentation. Calf Veins: No evidence of thrombus. Normal compressibility and flow on color Doppler imaging. Superficial Great Saphenous Vein: No evidence of thrombus. Normal compressibility. Venous Reflux:  None. Other Findings:  None. IMPRESSION: Occlusive thrombus within the right distal femoral vein, popliteal vein and calf veins. Electronically Signed   By: Rolm Baptise M.D.   On: 05/30/2019 23:55   Dg Chest Portable 1 View  Result Date: 05/29/2019 CLINICAL DATA:  Low back pain.  Shortness of breath. EXAM: PORTABLE CHEST 1 VIEW COMPARISON:  None. FINDINGS: Mild opacity in the lateral right lung base. The heart, hila, mediastinum, lungs, and pleura are otherwise normal. IMPRESSION: Mild opacity in the lateral right lung base may represent infiltrate or atelectasis. Recommend short-term follow-up to ensure resolution. Electronically Signed   By: Dorise Bullion III M.D   On: 05/29/2019 16:45    Telemetry    Sinus brady - sinus rhythm - Personally Reviewed  Cardiac Studies   2D Echocardiogram 6.18.2020   1. The left ventricle has normal systolic function, with an ejection fraction of 55-60%. The cavity size was normal. Left ventricular diastolic Doppler parameters are indeterminate.Unable to exclude regional wall motion abnormality.  2. The right ventricle has moderately reduced systolic function. The cavity was moderately enlarged. There is mildly increased right ventricular wall thickness. Right ventricular systolic pressure could not be assessed.  3. Challenging images   Patient Profile     78 y.o. male ? w/ a h/o BPH, GERD, HTN, HL, HTG, TIA, back pain, and CKD III, who was admitted 6/17 w/ abnl ecg, dyspnea, leg wkns, and mild troponin elevation and was subsequently found to have a saddle PE w/ R heart strain.  Assessment & Plan    1.  Acute Saddle PE:  Admitted w/ dyspnea and abnl ecg.  Echo showed nl LVEF w/  dilated RV concerning for PE.  CTA confirmed saddle PE.  This very likely explains mild troponin elevation.  Cont heparin.  Vascular surgery consult pending - ? TPA.  Plan to change to DOAC once it's clear invasive procedures either not required or completed.  2.  R LE DVT:  Occlusive thrombus within the right distal femoral vein, popliteal vein and calf veins on u/s.  Pt reported that he has been more sedentary over the past month.  Anticoagulation as above.  Vasc surgery to see - ? Filter.  3.  Essential HTN:  Relatively stable. Not on antihypertensives @ home.  4.  HL:  Cont statin.  LDL 55.  6.  CKD III:  Stable.  Follow post-contrast yesterday.  Signed, Murray Hodgkins, NP  05/31/2019, 9:50 AM    For questions or updates, please contact   Please consult [email or web address] for contact info under Cardiology/STEMI.

## 2019-05-31 NOTE — Progress Notes (Signed)
   05/31/19 1900  Clinical Encounter Type  Visited With Patient  Visit Type Initial;Spiritual support  Referral From Patient;Nurse  Spiritual Encounters  Spiritual Needs Emotional;Grief support;Other (Comment)  Stress Factors  Patient Stress Factors Health changes;Loss;Other (Comment)

## 2019-05-31 NOTE — Progress Notes (Signed)
[facility]  ROUNDING NOTE   Subjective:    PE on angiogram  NS at 19mL/hr  Objective:  Vital signs in last 24 hours:  Temp:  [97.7 F (36.5 C)-98.7 F (37.1 C)] 97.7 F (36.5 C) (06/19 0756) Pulse Rate:  [59-87] 68 (06/19 0756) BP: (129-141)/(78-87) 141/81 (06/19 0756) SpO2:  [90 %-96 %] 96 % (06/19 0756)  Weight change:  Filed Weights   05/29/19 1549 05/30/19 0024  Weight: 90.7 kg 87.2 kg    Intake/Output: I/O last 3 completed shifts: In: 377.2 [I.V.:127.2; IV Piggyback:250] Out: 675 [Urine:675]   Intake/Output this shift:  Total I/O In: -  Out: 225 [Urine:225]  Physical Exam: General: NAD,   Head: Normocephalic, atraumatic. Moist oral mucosal membranes  Eyes: Anicteric, PERRL  Neck: Supple, trachea midline  Lungs:  Clear to auscultation  Heart: Regular rate and rhythm  Abdomen:  Soft, nontender,   Extremities:  no peripheral edema.  Neurologic: Nonfocal, moving all four extremities  Skin: No lesions       Basic Metabolic Panel: Recent Labs  Lab 05/29/19 1600 05/30/19 0056 05/30/19 0344 05/31/19 0559  NA 138  --  140 139  K 4.4  --  4.0 4.0  CL 104  --  108 110  CO2 23  --  22 22  GLUCOSE 144*  --  129* 122*  BUN 34*  --  33* 30*  CREATININE 1.70*  --  1.57* 1.51*  CALCIUM 9.8  --  9.4 9.0  MG  --  2.2  --   --     Liver Function Tests: Recent Labs  Lab 05/29/19 1600  AST 18  ALT 10  ALKPHOS 39  BILITOT 1.0  PROT 7.1  ALBUMIN 4.0   No results for input(s): LIPASE, AMYLASE in the last 168 hours. No results for input(s): AMMONIA in the last 168 hours.  CBC: Recent Labs  Lab 05/29/19 1600 05/30/19 0344 05/31/19 0559  WBC 15.3* 11.9* 10.8*  HGB 16.8 15.5 14.3  HCT 49.2 46.9 42.4  MCV 93.7 95.1 92.8  PLT 256 216 248    Cardiac Enzymes: Recent Labs  Lab 05/29/19 1600 05/29/19 2152 05/30/19 0344 05/30/19 0955  TROPONINI 0.06* 0.08* 0.06* 0.05*    BNP: Invalid input(s): POCBNP  CBG: No results for  input(s): GLUCAP in the last 168 hours.  Microbiology: Results for orders placed or performed during the hospital encounter of 05/29/19  SARS Coronavirus 2 (CEPHEID- Performed in [facility] hospital lab), Hosp Order     Status: None   Collection Time: 05/29/19  4:23 PM   Specimen: Nasopharyngeal Swab  Result Value Ref Range Status   SARS Coronavirus 2 NEGATIVE NEGATIVE Final    Comment: (NOTE) If result is NEGATIVE SARS-CoV-2 target nucleic acids are NOT DETECTED. The SARS-CoV-2 RNA is generally detectable in upper and lower  respiratory specimens during the acute phase of infection. The lowest  concentration of SARS-CoV-2 viral copies this assay can detect is 250  copies / mL. A negative result does not preclude SARS-CoV-2 infection  and should not be used as the sole basis for treatment or other  patient management decisions.  A negative result may occur with  improper specimen collection / handling, submission of specimen other  than nasopharyngeal swab, presence of viral mutation(s) within the  areas targeted by this assay, and inadequate number of viral copies  (<250 copies / mL). A negative result must be combined with clinical  observations, patient history, and epidemiological information.  If result is POSITIVE SARS-CoV-2 target nucleic acids are DETECTED. The SARS-CoV-2 RNA is generally detectable in upper and lower  respiratory specimens dur ing the acute phase of infection.  Positive  results are indicative of active infection with SARS-CoV-2.  Clinical  correlation with patient history and other diagnostic information is  necessary to determine patient infection status.  Positive results do  not rule out bacterial infection or co-infection with other viruses. If result is PRESUMPTIVE POSTIVE SARS-CoV-2 nucleic acids MAY BE PRESENT.   A presumptive positive result was obtained on the submitted specimen  and confirmed on repeat testing.  While 2019 novel coronavirus   (SARS-CoV-2) nucleic acids may be present in the submitted sample  additional confirmatory testing may be necessary for epidemiological  and / or clinical management purposes  to differentiate between  SARS-CoV-2 and other Sarbecovirus currently known to infect humans.  If clinically indicated additional testing with an alternate test  methodology [phone]) is advised. The SARS-CoV-2 RNA is generally  detectable in upper and lower respiratory sp ecimens during the acute  phase of infection. The expected result is Negative. Fact Sheet for Patients:  StrictlyIdeas.no Fact Sheet for Healthcare Providers: BankingDealers.co.za This test is not yet approved or cleared by the [place] FDA and has been authorized for detection and/or diagnosis of SARS-CoV-2 by FDA under an Emergency Use Authorization (EUA).  This EUA will remain in effect (meaning this test can be used) for the duration of the COVID-19 declaration under Section 564(b)(1) of the Act, 21 U.S.C. section 360bbb-3(b)(1), unless the authorization is terminated or revoked sooner. Performed at [facility], [address] [place], [place] [address]   Blood Culture (routine x 2)     Status: None (Preliminary result)   Collection Time: 05/29/19  6:07 PM   Specimen: BLOOD  Result Value Ref Range Status   Specimen Description BLOOD BLOOD LEFT HAND  Final   Special Requests   Final    BOTTLES DRAWN AEROBIC AND ANAEROBIC Blood Culture results may not be optimal due to an excessive volume of blood received in culture bottles   Culture   Final    NO GROWTH 2 DAYS Performed at [facility], [address] [place], [place] [address]    Report Status PENDING  Incomplete  Blood Culture (routine x 2)     Status: None (Preliminary result)   Collection Time: 05/29/19  6:09 PM   Specimen: BLOOD  Result Value Ref Range Status   Specimen Description BLOOD BLOOD RIGHT ARM   Final   Special Requests   Final    BOTTLES DRAWN AEROBIC AND ANAEROBIC Blood Culture results may not be optimal due to an excessive volume of blood received in culture bottles   Culture   Final    NO GROWTH 2 DAYS Performed at [facility], [address] [place], [place] [address]    Report Status PENDING  Incomplete    Coagulation Studies: Recent Labs    05/29/19 1945  LABPROT 15.0  INR 1.2    Urinalysis: No results for input(s): COLORURINE, LABSPEC, PHURINE, GLUCOSEU, HGBUR, BILIRUBINUR, KETONESUR, PROTEINUR, UROBILINOGEN, NITRITE, LEUKOCYTESUR in the last 72 hours.  Invalid input(s): APPERANCEUR    Imaging: Ct Angio Chest Pe W Or Wo Contrast  Addendum Date: 05/30/2019   ADDENDUM REPORT: 05/30/2019 19:49 ADDENDUM: These results were called by telephone at the time of interpretation on 05/30/2019 at 7:48 pm to RN Valere Dross, who verbally acknowledged these results. Multiple attempts were made  by the reading physician and PRA to contact the ordering physician. These attempts were unsuccessful. RN endorsed understanding that this is a critical finding and large pulmonary embolus requiring immediate action. Electronically Signed   By: Constance Holster M.D.   On: 05/30/2019 19:49   Result Date: 05/30/2019 CLINICAL DATA:  Acute respiratory failure. EXAM: CT ANGIOGRAPHY CHEST WITH CONTRAST TECHNIQUE: Multidetector CT imaging of the chest was performed using the standard protocol during bolus administration of intravenous contrast. Multiplanar CT image reconstructions and MIPs were obtained to evaluate the vascular anatomy. CONTRAST:  76mL OMNIPAQUE IOHEXOL 350 MG/ML SOLN COMPARISON:  None. FINDINGS: Cardiovascular: There is a large saddle pulmonary embolus with extensive pulmonary emboli involving the lobar, segmental, and subsegmental branches of virtually all lobes. There is right heart strain with an RV/LV ratio measuring approximately 1.3. The heart size is enlarged. There is  reflux of contrast in the IVC consistent with right heart strain/failure Mediastinum/Nodes: No enlarged mediastinal, hilar, or axillary lymph nodes. Thyroid gland, trachea, and esophagus demonstrate no significant findings. Lungs/Pleura: There is atelectasis at the right lung base. There is a trace right-sided pleural effusion. There is an airspace opacity in the right middle lobe. There is no pneumothorax. The trachea is unremarkable. Upper Abdomen: There is cholelithiasis without CT evidence of acute cholecystitis involving the partially visualized gallbladder. There appears to be underlying hepatic steatosis. Musculoskeletal: No chest wall abnormality. No acute or significant osseous findings. Review of the MIP images confirms the above findings. IMPRESSION: 1. Extensive acute bilateral pulmonary emboli as detailed above including a saddle pulmonary embolus. There is CT evidence of right heart strain. Positive for acute PE with CT evidence of right heart strain (RV/LV Ratio = 1.3) consistent with at least submassive (intermediate risk) PE. The presence of right heart strain has been associated with an increased risk of morbidity and mortality. 2. Airspace opacity in the right middle lobe may represent infiltrate or developing pulmonary infarct in the setting of acute pulmonary emboli. 3. Trace right-sided pleural effusion. 4. Cardiomegaly. 5. Hepatic steatosis. 6. Cholelithiasis. Electronically Signed: By: Constance Holster M.D. On: 05/30/2019 19:27   US Venous Img Lower Bilateral  Result Date: 05/30/2019 CLINICAL DATA:  Bilateral pulmonary emboli EXAM: BILATERAL LOWER EXTREMITY VENOUS DOPPLER ULTRASOUND TECHNIQUE: Gray-scale sonography with graded compression, as well as color Doppler and duplex ultrasound were performed to evaluate the lower extremity deep venous systems from the level of the common femoral vein and including the common femoral, femoral, profunda femoral, popliteal and calf veins including  the posterior tibial, peroneal and gastrocnemius veins when visible. The superficial great saphenous vein was also interrogated. Spectral Doppler was utilized to evaluate flow at rest and with distal augmentation maneuvers in the common femoral, femoral and popliteal veins. COMPARISON:  None. FINDINGS: RIGHT LOWER EXTREMITY Common Femoral Vein: No evidence of thrombus. Normal compressibility, respiratory phasicity and response to augmentation. Saphenofemoral Junction: No evidence of thrombus. Normal compressibility and flow on color Doppler imaging. Profunda Femoral Vein: No evidence of thrombus. Normal compressibility and flow on color Doppler imaging. Femoral Vein: Occlusive thrombus noted in the distal femoral vein. Distal vessel is noncompressible. Popliteal Vein: Occlusive thrombus noted in the right popliteal vein. Calf Veins: Occlusive thrombus noted within the posterior tibial veins and 1 peroneal vein. Superficial Great Saphenous Vein: No evidence of thrombus. Normal compressibility. Venous Reflux:  None. Other Findings:  None. LEFT LOWER EXTREMITY Common Femoral Vein: No evidence of thrombus. Normal compressibility, respiratory phasicity and response to augmentation. Saphenofemoral Junction: No evidence  of thrombus. Normal compressibility and flow on color Doppler imaging. Profunda Femoral Vein: No evidence of thrombus. Normal compressibility and flow on color Doppler imaging. Femoral Vein: No evidence of thrombus. Normal compressibility, respiratory phasicity and response to augmentation. Popliteal Vein: No evidence of thrombus. Normal compressibility, respiratory phasicity and response to augmentation. Calf Veins: No evidence of thrombus. Normal compressibility and flow on color Doppler imaging. Superficial Great Saphenous Vein: No evidence of thrombus. Normal compressibility. Venous Reflux:  None. Other Findings:  None. IMPRESSION: Occlusive thrombus within the right distal femoral vein, popliteal vein  and calf veins. Electronically Signed   By: Rolm Baptise M.D.   On: 05/30/2019 23:55   Dg Chest Portable 1 View  Result Date: 05/29/2019 CLINICAL DATA:  Low back pain.  Shortness of breath. EXAM: PORTABLE CHEST 1 VIEW COMPARISON:  None. FINDINGS: Mild opacity in the lateral right lung base. The heart, hila, mediastinum, lungs, and pleura are otherwise normal. IMPRESSION: Mild opacity in the lateral right lung base may represent infiltrate or atelectasis. Recommend short-term follow-up to ensure resolution. Electronically Signed   By: Dorise Bullion III M.D   On: 05/29/2019 16:45     Medications:   . sodium chloride    . sodium chloride 100 mL/hr at 05/31/19 0708  . azithromycin 500 mg (05/30/19 2141)  . cefTRIAXone (ROCEPHIN)  IV 2 g (05/30/19 1806)  . heparin 1,600 Units/hr (05/31/19 0319)   . aspirin EC  81 mg Oral Daily  . atorvastatin  40 mg Oral q1800  . carbidopa-levodopa  2 tablet Oral TID  . cholecalciferol  500 Units Oral Daily  . fenofibrate  160 mg Oral Daily  . oxybutynin  5 mg Oral Daily  . pantoprazole (PROTONIX) IV  40 mg Intravenous Q24H  . sodium chloride flush  3 mL Intravenous Once  . sodium chloride flush  3 mL Intravenous Q12H   sodium chloride, acetaminophen, ipratropium-albuterol, nitroGLYCERIN, ondansetron (ZOFRAN) IV, sodium chloride flush  Assessment/ Plan:  [name] is a 78 y.o. white male with hyperlipidemia, hypertension, BPH, GERD, history of elevated uric acid level, chronic dizziness and imbalance   1. Chronic kidney disease stage III. Creatinine at baseline. Bland urine.  - recommend IV fluids 12-24 hour before the CTA and then 12 hours afterwards. Will start normal saline infusion at 150mL/hr - Hold ACE-I/ARB and diuretics  2. Hypertension. On no blood pressure agents recently.   3. Secondary hyperparathyroidism. Most recent PTH was 60. Not currently on vitamin D agents.   4. Pulmonary Embolism - Appreciate vascular input.  -  heparin gtt   LOS: 2 Layton Tappan 6/19/202011:59 AM

## 2019-05-31 NOTE — Consult Note (Signed)
[facility] Vascular Consult Note  MRN : [id number]  Brendan Nguyen is a 78 y.o. (01-10-1941) male who presents with chief complaint of  Chief Complaint  Patient presents with  . Back Pain   History of Present Illness:  The patient is a 78 year old male with a past medical history of TIA (2013), hyperlipidemia, hyper tension, hyperparathyroidism, GERD, chronic kidney disease stage III, BPH who presented to the [facility] emergency department on May 29, 2019 with a chief complaint of back pain.  The patient endorses a history of shortness of breath starting on Monday.  He notes his shortness of breath progressively worsened which prompted him to seek medical attention.  Patient notes that the pain was radiating towards his back and started acutely.  He denies any fever, nausea vomiting.  He denies any surgery or trauma to the bilateral legs.  He denies any prolonged illness.  Denies any clotting/bleeding disorders.  Denies any history of DVT.  Denies any pain or swelling to the bilateral legs.  During his emergency room workout the patient was found to have the following: 05/30/19: CTA Chest: 1. Extensive acute bilateral pulmonary emboli as detailed above including a saddle pulmonary embolus. There is CT evidence of right heart strain. Positive for acute PE with CT evidence of right heart strain (RV/LV Ratio = 1.3) consistent with at least submassive (intermediate risk) PE. The presence of right heart strain has been associated with an increased risk of morbidity and mortality.  05/30/19: Bilateral Venous Duplex: Occlusive thrombus within the right distal femoral vein, popliteal vein and calf veins.  Vascular surgery was consulted by NP [facility] for further recommendations. Current Facility-Administered Medications  Medication Dose Route Frequency Provider Last Rate Last Dose  . 0.9 %  sodium chloride infusion  250 mL Intravenous PRN Seals, Levada Dy H,  NP      . 0.9 %  sodium chloride infusion   Intravenous Continuous Kolluru, Sarath, MD 100 mL/hr at 05/31/19 0708    . acetaminophen (TYLENOL) tablet 650 mg  650 mg Oral Q4H PRN Gardiner Barefoot H, NP   650 mg at 05/31/19 0327  . aspirin EC tablet 81 mg  81 mg Oral Daily Mayer Camel, NP   81 mg at 05/30/19 7169  . atorvastatin (LIPITOR) tablet 40 mg  40 mg Oral q1800 Seals, Levada Dy H, NP   40 mg at 05/30/19 1753  . azithromycin (ZITHROMAX) 500 mg in sodium chloride 0.9 % 250 mL IVPB  500 mg Intravenous Q24H Seals, Angela H, NP 250 mL/hr at 05/30/19 2141 500 mg at 05/30/19 2141  . carbidopa-levodopa (SINEMET IR) 25-100 MG per tablet immediate release 2 tablet  2 tablet Oral TID Mayer Camel, NP   2 tablet at 05/30/19 2151  . cefTRIAXone (ROCEPHIN) 2 g in sodium chloride 0.9 % 100 mL IVPB  2 g Intravenous Q24H Seals, Angela H, NP 200 mL/hr at 05/30/19 1806 2 g at 05/30/19 1806  . cholecalciferol (VITAMIN D3) tablet 500 Units  500 Units Oral Daily Mayer Camel, NP   500 Units at 05/30/19 6789  . fenofibrate tablet 160 mg  160 mg Oral Daily Seals, Theo Dills, NP   160 mg at 05/30/19 3810  . heparin ADULT infusion 100 units/mL (25000 units/29mL sodium chloride 0.45%)  1,600 Units/hr Intravenous Continuous Hillary Bow, MD 16 mL/hr at 05/31/19 0319 1,600 Units/hr at 05/31/19 0319  . ipratropium-albuterol (DUONEB) 0.5-2.5 (3) MG/3ML nebulizer solution 3 mL  3 mL  Nebulization Q6H PRN Seals, Levada Dy H, NP      . nitroGLYCERIN (NITROSTAT) SL tablet 0.4 mg  0.4 mg Sublingual Q5 Min x 3 PRN Seals, Levada Dy H, NP      . ondansetron (ZOFRAN) injection 4 mg  4 mg Intravenous Q6H PRN Seals, Theo Dills, NP      . oxybutynin (DITROPAN-XL) 24 hr tablet 5 mg  5 mg Oral Daily Seals, Theo Dills, NP   5 mg at 05/30/19 0938  . pantoprazole (PROTONIX) injection 40 mg  40 mg Intravenous Q24H Seals, Angela H, NP   40 mg at 05/31/19 0321  . sodium chloride flush (NS) 0.9 % injection 3 mL  3 mL Intravenous Once Lavonia Drafts,  MD      . sodium chloride flush (NS) 0.9 % injection 3 mL  3 mL Intravenous Q12H Seals, Theo Dills, NP   3 mL at 05/30/19 2132  . sodium chloride flush (NS) 0.9 % injection 3 mL  3 mL Intravenous PRN Seals, Theo Dills, NP       Past Medical History:  Diagnosis Date  . BPH (benign prostatic hyperplasia)   . CKD (chronic kidney disease), stage III ([place])   . Elevated blood uric acid level   . GERD (gastroesophageal reflux disease)   . History of echocardiogram    a.02/2016 Echo: EF 50-55%. Mild LVH. Mild AI. Triv MR/TR.  Marland Kitchen Hyperparathyroidism ([place])   . Hypertension   . Hypertriglyceridemia   . Mixed hyperlipidemia   . TIA (transient ischemic attack) 2013   Past Surgical History:  Procedure Laterality Date  . ACHILLES TENDON REPAIR    . BLEPHAROPLASTY    . L5 lamenectomy     had surgery but not the complete surgery  . l5-s1 laninectomy    . left inguinal hernia     Social History Social History   Tobacco Use  . Smoking status: Never Smoker  . Smokeless tobacco: Never Used  Substance Use Topics  . Alcohol use: No    Alcohol/week: 0.0 standard drinks  . Drug use: No   Family History History reviewed. No pertinent family history.  Denies family history of peripheral artery disease, venous disease, bleeding/clotting disorders.  No Known Allergies  REVIEW OF SYSTEMS (Negative unless checked)  Constitutional: [] Weight loss  [] Fever  [] Chills Cardiac: [] Chest pain   [] Chest pressure   [] Palpitations   [x] Shortness of breath when laying flat   [x] Shortness of breath at rest   [x] Shortness of breath with exertion. Vascular:  [] Pain in legs with walking   [] Pain in legs at rest   [] Pain in legs when laying flat   [] Claudication   [] Pain in feet when walking  [] Pain in feet at rest  [] Pain in feet when laying flat   [] History of DVT   [] Phlebitis   [] Swelling in legs   [] Varicose veins   [] Non-healing ulcers Pulmonary:   [] Uses home oxygen   [] Productive cough   [] Hemoptysis   [] Wheeze   [] COPD   [] Asthma Neurologic:  [] Dizziness  [] Blackouts   [] Seizures   [] History of stroke   [x] History of TIA  [] Aphasia   [] Temporary blindness   [] Dysphagia   [] Weakness or numbness in arms   [] Weakness or numbness in legs Musculoskeletal:  [] Arthritis   [] Joint swelling   [] Joint pain   [] Low back pain Hematologic:  [] Easy bruising  [] Easy bleeding   [] Hypercoagulable state   [] Anemic  [] Hepatitis Gastrointestinal:  [] Blood in stool   [] Vomiting blood  [] Gastroesophageal  reflux/heartburn   [] Difficulty swallowing. Genitourinary:  [x] Chronic kidney disease   [] Difficult urination  [] Frequent urination  [] Burning with urination   [] Blood in urine Skin:  [] Rashes   [] Ulcers   [] Wounds Psychological:  [] History of anxiety   []  History of major depression.  Physical Examination  Vitals:   05/30/19 1616 05/30/19 2021 05/31/19 0554 05/31/19 0756  BP: 134/87 138/78 129/85 (!) 141/81  Pulse: 87 69 (!) 59 68  Resp:      Temp: 98.1 F (36.7 C) 98.4 F (36.9 C) 98.7 F (37.1 C) 97.7 F (36.5 C)  TempSrc: Oral Oral Oral Oral  SpO2: 91% 90% 95% 96%  Weight:      Height:       Body mass index is 29.22 kg/m. Gen:  WD/WN, NAD Head: [place]/AT, No temporalis wasting. Prominent temp pulse not noted. Ear/Nose/Throat: Hearing grossly intact, nares w/o erythema or drainage, oropharynx w/o Erythema/Exudate Eyes: Sclera non-icteric, conjunctiva clear Neck: Trachea midline.  No JVD.  Pulmonary:  Good air movement, respirations not labored, equal bilaterally.  Cardiac: RRR, normal S1, S2. Vascular:  Vessel Right Left  Radial Palpable Palpable  Ulnar Palpable Palpable  Brachial Palpable Palpable  Carotid Palpable, without bruit Palpable, without bruit  Aorta Not palpable N/A  Femoral Palpable Palpable  Popliteal Palpable Palpable  PT Palpable Palpable  DP Palpable Palpable   Right Lower Extremity: Thigh soft.  Calf soft.  Nontender to palpation.  Nontender with dorsiflexion.  Extremity is warm  distally to toes.  Good capillary refill.  Skin is intact.  No cellulitis.  Motor/function is intact.  Gastrointestinal: soft, non-tender/non-distended. No guarding/reflex.  Musculoskeletal: M/S 5/5 throughout.  Extremities without ischemic changes.  No deformity or atrophy.  Neurologic: Sensation grossly intact in extremities.  Symmetrical.  Speech is fluent. Motor exam as listed above. Psychiatric: Judgment intact, Mood & affect appropriate for pt's clinical situation. Dermatologic: No rashes or ulcers noted.  No cellulitis or open wounds. Lymph : No Cervical, Axillary, or Inguinal lymphadenopathy.  CBC Lab Results  Component Value Date   WBC 10.8 (H) 05/31/2019   HGB 14.3 05/31/2019   HCT 42.4 05/31/2019   MCV 92.8 05/31/2019   PLT 248 05/31/2019   BMET    Component Value Date/Time   NA 139 05/31/2019 0559   NA 139 11/20/2015 0815   NA 134 (L) 05/02/2012 0128   K 4.0 05/31/2019 0559   K 5.3 (H) 05/02/2012 0128   CL 110 05/31/2019 0559   CL 102 05/02/2012 0128   CO2 22 05/31/2019 0559   CO2 24 05/02/2012 0128   GLUCOSE 122 (H) 05/31/2019 0559   GLUCOSE 192 (H) 05/02/2012 0128   BUN 30 (H) 05/31/2019 0559   BUN 30 (H) 11/20/2015 0815   BUN 43 (H) 05/02/2012 0128   CREATININE 1.51 (H) 05/31/2019 0559   CREATININE 1.72 (H) 05/02/2012 0128   CALCIUM 9.0 05/31/2019 0559   CALCIUM 9.5 05/02/2012 0128   GFRNONAA 43 (L) 05/31/2019 0559   GFRNONAA 39 (L) 05/02/2012 0128   GFRAA 50 (L) 05/31/2019 0559   GFRAA 45 (L) 05/02/2012 0128   Estimated Creatinine Clearance: 42.6 mL/min (A) (by C-G formula based on SCr of 1.51 mg/dL (H)).  COAG Lab Results  Component Value Date   INR 1.2 05/29/2019   Radiology Ct Angio Chest Pe W Or Wo Contrast  Addendum Date: 05/30/2019   ADDENDUM REPORT: 05/30/2019 19:49 ADDENDUM: These results were called by telephone at the time of interpretation on 05/30/2019 at 7:48 pm  to RN Valere Dross, who verbally acknowledged these results. Multiple attempts  were made by the reading physician and PRA to contact the ordering physician. These attempts were unsuccessful. RN endorsed understanding that this is a critical finding and large pulmonary embolus requiring immediate action. Electronically Signed   By: Constance Holster M.D.   On: 05/30/2019 19:49   Result Date: 05/30/2019 CLINICAL DATA:  Acute respiratory failure. EXAM: CT ANGIOGRAPHY CHEST WITH CONTRAST TECHNIQUE: Multidetector CT imaging of the chest was performed using the standard protocol during bolus administration of intravenous contrast. Multiplanar CT image reconstructions and MIPs were obtained to evaluate the vascular anatomy. CONTRAST:  92mL OMNIPAQUE IOHEXOL 350 MG/ML SOLN COMPARISON:  None. FINDINGS: Cardiovascular: There is a large saddle pulmonary embolus with extensive pulmonary emboli involving the lobar, segmental, and subsegmental branches of virtually all lobes. There is right heart strain with an RV/LV ratio measuring approximately 1.3. The heart size is enlarged. There is reflux of contrast in the IVC consistent with right heart strain/failure Mediastinum/Nodes: No enlarged mediastinal, hilar, or axillary lymph nodes. Thyroid gland, trachea, and esophagus demonstrate no significant findings. Lungs/Pleura: There is atelectasis at the right lung base. There is a trace right-sided pleural effusion. There is an airspace opacity in the right middle lobe. There is no pneumothorax. The trachea is unremarkable. Upper Abdomen: There is cholelithiasis without CT evidence of acute cholecystitis involving the partially visualized gallbladder. There appears to be underlying hepatic steatosis. Musculoskeletal: No chest wall abnormality. No acute or significant osseous findings. Review of the MIP images confirms the above findings. IMPRESSION: 1. Extensive acute bilateral pulmonary emboli as detailed above including a saddle pulmonary embolus. There is CT evidence of right heart strain. Positive for  acute PE with CT evidence of right heart strain (RV/LV Ratio = 1.3) consistent with at least submassive (intermediate risk) PE. The presence of right heart strain has been associated with an increased risk of morbidity and mortality. 2. Airspace opacity in the right middle lobe may represent infiltrate or developing pulmonary infarct in the setting of acute pulmonary emboli. 3. Trace right-sided pleural effusion. 4. Cardiomegaly. 5. Hepatic steatosis. 6. Cholelithiasis. Electronically Signed: By: Constance Holster M.D. On: 05/30/2019 19:27   US Venous Img Lower Bilateral  Result Date: 05/30/2019 CLINICAL DATA:  Bilateral pulmonary emboli EXAM: BILATERAL LOWER EXTREMITY VENOUS DOPPLER ULTRASOUND TECHNIQUE: Gray-scale sonography with graded compression, as well as color Doppler and duplex ultrasound were performed to evaluate the lower extremity deep venous systems from the level of the common femoral vein and including the common femoral, femoral, profunda femoral, popliteal and calf veins including the posterior tibial, peroneal and gastrocnemius veins when visible. The superficial great saphenous vein was also interrogated. Spectral Doppler was utilized to evaluate flow at rest and with distal augmentation maneuvers in the common femoral, femoral and popliteal veins. COMPARISON:  None. FINDINGS: RIGHT LOWER EXTREMITY Common Femoral Vein: No evidence of thrombus. Normal compressibility, respiratory phasicity and response to augmentation. Saphenofemoral Junction: No evidence of thrombus. Normal compressibility and flow on color Doppler imaging. Profunda Femoral Vein: No evidence of thrombus. Normal compressibility and flow on color Doppler imaging. Femoral Vein: Occlusive thrombus noted in the distal femoral vein. Distal vessel is noncompressible. Popliteal Vein: Occlusive thrombus noted in the right popliteal vein. Calf Veins: Occlusive thrombus noted within the posterior tibial veins and 1 peroneal vein.  Superficial Great Saphenous Vein: No evidence of thrombus. Normal compressibility. Venous Reflux:  None. Other Findings:  None. LEFT LOWER EXTREMITY Common Femoral Vein: No evidence of  thrombus. Normal compressibility, respiratory phasicity and response to augmentation. Saphenofemoral Junction: No evidence of thrombus. Normal compressibility and flow on color Doppler imaging. Profunda Femoral Vein: No evidence of thrombus. Normal compressibility and flow on color Doppler imaging. Femoral Vein: No evidence of thrombus. Normal compressibility, respiratory phasicity and response to augmentation. Popliteal Vein: No evidence of thrombus. Normal compressibility, respiratory phasicity and response to augmentation. Calf Veins: No evidence of thrombus. Normal compressibility and flow on color Doppler imaging. Superficial Great Saphenous Vein: No evidence of thrombus. Normal compressibility. Venous Reflux:  None. Other Findings:  None. IMPRESSION: Occlusive thrombus within the right distal femoral vein, popliteal vein and calf veins. Electronically Signed   By: Rolm Baptise M.D.   On: 05/30/2019 23:55   Dg Chest Portable 1 View  Result Date: 05/29/2019 CLINICAL DATA:  Low back pain.  Shortness of breath. EXAM: PORTABLE CHEST 1 VIEW COMPARISON:  None. FINDINGS: Mild opacity in the lateral right lung base. The heart, hila, mediastinum, lungs, and pleura are otherwise normal. IMPRESSION: Mild opacity in the lateral right lung base may represent infiltrate or atelectasis. Recommend short-term follow-up to ensure resolution. Electronically Signed   By: Dorise Bullion III M.D   On: 05/29/2019 16:45   Assessment/Plan The patient is a 78 year old male with a past medical history of TIA (2013), hyperlipidemia, hyper tension, hyperparathyroidism, GERD, chronic kidney disease stage III, BPH who presented to the [facility] emergency department on May 29, 2019 with a chief complaint of back pain. 1.  Pulmonary Embolism: Patient found to have an extensive acute bilateral pulmonary emboli as detailed above including a saddle pulmonary embolus. There is CT evidence of right heart strain.  DVT seems to be unprovoked.  Reviewed CTA with Dr. Delana Meyer. Given the size of the patient's pulmonary embolus and extensive right lower extremity DVT would recommend a pulmonary lysis in an attempt to decrease the clot burden / improve the right heart strain.  Procedure, risks and benefits explained to the patient.  All questions answered.  The patient wishes to move forward.  We will plan on this this afternoon with Dr. stair. 2. Right Lower Extremity DVT: The patient is currently asymptomatic with a relatively unremarkable physical exam.  There is no indication for venous lysis at this time.  Agree with heparin and transitioning to p.o. Eliquis once stable.  Recommend elevation at this time. 3. Hyperlipidemia: On fenofibrate for medical management. Encouraged good control as its slows the progression of atherosclerotic disease 4. Hypertension: On appropriate medications. Encouraged good control as its slows the progression of atherosclerotic disease.  Discussed with Dr. Francene Castle, PA-C  05/31/2019 10:54 AM  This note was created with Dragon medical transcription system.  Any error is purely unintentional

## 2019-05-31 NOTE — Consult Note (Signed)
Progress Note  Patient Name: Brendan Nguyen Date of Encounter: 05/31/2019  Primary Cardiologist: Ida Rogue, MD  Subjective   No c/p or sob.  Denies leg pain or swelling.  Inpatient Medications    Scheduled Meds:  aspirin EC  81 mg Oral Daily   atorvastatin  40 mg Oral q1800   carbidopa-levodopa  2 tablet Oral TID   cholecalciferol  500 Units Oral Daily   fenofibrate  160 mg Oral Daily   oxybutynin  5 mg Oral Daily   pantoprazole (PROTONIX) IV  40 mg Intravenous Q24H   sodium chloride flush  3 mL Intravenous Once   sodium chloride flush  3 mL Intravenous Q12H   Continuous Infusions:  sodium chloride     sodium chloride 100 mL/hr at 05/31/19 0708   azithromycin 500 mg (05/30/19 2141)   cefTRIAXone (ROCEPHIN)  IV 2 g (05/30/19 1806)   heparin 1,600 Units/hr (05/31/19 0319)   PRN Meds: sodium chloride, acetaminophen, ipratropium-albuterol, nitroGLYCERIN, ondansetron (ZOFRAN) IV, sodium chloride flush   Vital Signs    Vitals:   05/30/19 1616 05/30/19 2021 05/31/19 0554 05/31/19 0756  BP: 134/87 138/78 129/85 (!) 141/81  Pulse: 87 69 (!) 59 68  Resp:      Temp: 98.1 F (36.7 C) 98.4 F (36.9 C) 98.7 F (37.1 C) 97.7 F (36.5 C)  TempSrc: Oral Oral Oral Oral  SpO2: 91% 90% 95% 96%  Weight:      Height:        Intake/Output Summary (Last 24 hours) at 05/31/2019 0950 Last data filed at 05/31/2019 0601 Gross per 24 hour  Intake --  Output 300 ml  Net -300 ml   Filed Weights   05/29/19 1549 05/30/19 0024  Weight: 90.7 kg 87.2 kg    Physical Exam   GEN: Well nourished, well developed, in no acute distress.  HEENT: Grossly normal.  Neck: Supple, no JVD, carotid bruits, or masses. Cardiac: RRR, no murmurs, rubs, or gallops. No clubbing, cyanosis, edema.  Radials/DP/PT 2+ and equal bilaterally.  Respiratory:  Respirations regular and unlabored, clear to auscultation bilaterally. GI: Soft, nontender, nondistended, BS + x 4. MS: no deformity  or atrophy. Skin: warm and dry, no rash. Neuro:  Strength and sensation are intact. Psych: AAOx3.  Normal affect.  Labs    Chemistry Recent Labs  Lab 05/29/19 1600 05/30/19 0344 05/31/19 0559  NA 138 140 139  K 4.4 4.0 4.0  CL 104 108 110  CO2 23 22 22   GLUCOSE 144* 129* 122*  BUN 34* 33* 30*  CREATININE 1.70* 1.57* 1.51*  CALCIUM 9.8 9.4 9.0  PROT 7.1  --   --   ALBUMIN 4.0  --   --   AST 18  --   --   ALT 10  --   --   ALKPHOS 39  --   --   BILITOT 1.0  --   --   GFRNONAA 38* 41* 43*  GFRAA 43* 48* 50*  ANIONGAP 11 10 7      Hematology Recent Labs  Lab 05/29/19 1600 05/30/19 0344 05/31/19 0559  WBC 15.3* 11.9* 10.8*  RBC 5.25 4.93 4.57  HGB 16.8 15.5 14.3  HCT 49.2 46.9 42.4  MCV 93.7 95.1 92.8  MCH 32.0 31.4 31.3  MCHC 34.1 33.0 33.7  RDW 13.0 12.9 12.9  PLT 256 216 248    Cardiac Enzymes Recent Labs  Lab 05/29/19 1600 05/29/19 2152 05/30/19 0344 05/30/19 0955  TROPONINI 0.06* 0.08* 0.06*  0.05*     BNP Recent Labs  Lab 05/29/19 1600  BNP 716.0*      Radiology    Ct Angio Chest Pe W Or Wo Contrast  Addendum Date: 05/30/2019   ADDENDUM REPORT: 05/30/2019 19:49 ADDENDUM: These results were called by telephone at the time of interpretation on 05/30/2019 at 7:48 pm to RN Valere Dross, who verbally acknowledged these results. Multiple attempts were made by the reading physician and PRA to contact the ordering physician. These attempts were unsuccessful. RN endorsed understanding that this is a critical finding and large pulmonary embolus requiring immediate action. Electronically Signed   By: Constance Holster M.D.   On: 05/30/2019 19:49   Result Date: 05/30/2019 CLINICAL DATA:  Acute respiratory failure. EXAM: CT ANGIOGRAPHY CHEST WITH CONTRAST TECHNIQUE: Multidetector CT imaging of the chest was performed using the standard protocol during bolus administration of intravenous contrast. Multiplanar CT image reconstructions and MIPs were obtained to  evaluate the vascular anatomy. CONTRAST:  67mL OMNIPAQUE IOHEXOL 350 MG/ML SOLN COMPARISON:  None. FINDINGS: Cardiovascular: There is a large saddle pulmonary embolus with extensive pulmonary emboli involving the lobar, segmental, and subsegmental branches of virtually all lobes. There is right heart strain with an RV/LV ratio measuring approximately 1.3. The heart size is enlarged. There is reflux of contrast in the IVC consistent with right heart strain/failure Mediastinum/Nodes: No enlarged mediastinal, hilar, or axillary lymph nodes. Thyroid gland, trachea, and esophagus demonstrate no significant findings. Lungs/Pleura: There is atelectasis at the right lung base. There is a trace right-sided pleural effusion. There is an airspace opacity in the right middle lobe. There is no pneumothorax. The trachea is unremarkable. Upper Abdomen: There is cholelithiasis without CT evidence of acute cholecystitis involving the partially visualized gallbladder. There appears to be underlying hepatic steatosis. Musculoskeletal: No chest wall abnormality. No acute or significant osseous findings. Review of the MIP images confirms the above findings. IMPRESSION: 1. Extensive acute bilateral pulmonary emboli as detailed above including a saddle pulmonary embolus. There is CT evidence of right heart strain. Positive for acute PE with CT evidence of right heart strain (RV/LV Ratio = 1.3) consistent with at least submassive (intermediate risk) PE. The presence of right heart strain has been associated with an increased risk of morbidity and mortality. 2. Airspace opacity in the right middle lobe may represent infiltrate or developing pulmonary infarct in the setting of acute pulmonary emboli. 3. Trace right-sided pleural effusion. 4. Cardiomegaly. 5. Hepatic steatosis. 6. Cholelithiasis. Electronically Signed: By: Constance Holster M.D. On: 05/30/2019 19:27   US Venous Img Lower Bilateral  Result Date: 05/30/2019 CLINICAL DATA:   Bilateral pulmonary emboli EXAM: BILATERAL LOWER EXTREMITY VENOUS DOPPLER ULTRASOUND TECHNIQUE: Gray-scale sonography with graded compression, as well as color Doppler and duplex ultrasound were performed to evaluate the lower extremity deep venous systems from the level of the common femoral vein and including the common femoral, femoral, profunda femoral, popliteal and calf veins including the posterior tibial, peroneal and gastrocnemius veins when visible. The superficial great saphenous vein was also interrogated. Spectral Doppler was utilized to evaluate flow at rest and with distal augmentation maneuvers in the common femoral, femoral and popliteal veins. COMPARISON:  None. FINDINGS: RIGHT LOWER EXTREMITY Common Femoral Vein: No evidence of thrombus. Normal compressibility, respiratory phasicity and response to augmentation. Saphenofemoral Junction: No evidence of thrombus. Normal compressibility and flow on color Doppler imaging. Profunda Femoral Vein: No evidence of thrombus. Normal compressibility and flow on color Doppler imaging. Femoral Vein: Occlusive thrombus noted in the  distal femoral vein. Distal vessel is noncompressible. Popliteal Vein: Occlusive thrombus noted in the right popliteal vein. Calf Veins: Occlusive thrombus noted within the posterior tibial veins and 1 peroneal vein. Superficial Great Saphenous Vein: No evidence of thrombus. Normal compressibility. Venous Reflux:  None. Other Findings:  None. LEFT LOWER EXTREMITY Common Femoral Vein: No evidence of thrombus. Normal compressibility, respiratory phasicity and response to augmentation. Saphenofemoral Junction: No evidence of thrombus. Normal compressibility and flow on color Doppler imaging. Profunda Femoral Vein: No evidence of thrombus. Normal compressibility and flow on color Doppler imaging. Femoral Vein: No evidence of thrombus. Normal compressibility, respiratory phasicity and response to augmentation. Popliteal Vein: No evidence  of thrombus. Normal compressibility, respiratory phasicity and response to augmentation. Calf Veins: No evidence of thrombus. Normal compressibility and flow on color Doppler imaging. Superficial Great Saphenous Vein: No evidence of thrombus. Normal compressibility. Venous Reflux:  None. Other Findings:  None. IMPRESSION: Occlusive thrombus within the right distal femoral vein, popliteal vein and calf veins. Electronically Signed   By: Rolm Baptise M.D.   On: 05/30/2019 23:55   Dg Chest Portable 1 View  Result Date: 05/29/2019 CLINICAL DATA:  Low back pain.  Shortness of breath. EXAM: PORTABLE CHEST 1 VIEW COMPARISON:  None. FINDINGS: Mild opacity in the lateral right lung base. The heart, hila, mediastinum, lungs, and pleura are otherwise normal. IMPRESSION: Mild opacity in the lateral right lung base may represent infiltrate or atelectasis. Recommend short-term follow-up to ensure resolution. Electronically Signed   By: Dorise Bullion III M.D   On: 05/29/2019 16:45    Telemetry    Sinus brady - sinus rhythm - Personally Reviewed  Cardiac Studies   2D Echocardiogram 6.18.2020   1. The left ventricle has normal systolic function, with an ejection fraction of 55-60%. The cavity size was normal. Left ventricular diastolic Doppler parameters are indeterminate.Unable to exclude regional wall motion abnormality.  2. The right ventricle has moderately reduced systolic function. The cavity was moderately enlarged. There is mildly increased right ventricular wall thickness. Right ventricular systolic pressure could not be assessed.  3. Challenging images   Patient Profile     78 y.o. male ? w/ a h/o BPH, GERD, HTN, HL, HTG, TIA, back pain, and CKD III, who was admitted 6/17 w/ abnl ecg, dyspnea, leg wkns, and mild troponin elevation and was subsequently found to have a saddle PE w/ R heart strain.  Assessment & Plan    1.  Acute Saddle PE:  Admitted w/ dyspnea and abnl ecg.  Echo showed nl LVEF w/  dilated RV concerning for PE.  CTA confirmed saddle PE.  This very likely explains mild troponin elevation.  Cont heparin.  Vascular surgery consult pending - ? TPA.  Plan to change to DOAC once it's clear invasive procedures either not required or completed.  2.  R LE DVT:  Occlusive thrombus within the right distal femoral vein, popliteal vein and calf veins on u/s.  Pt reported that he has been more sedentary over the past month.  Anticoagulation as above.  Vasc surgery to see - ? Filter.  3.  Essential HTN:  Relatively stable. Not on antihypertensives @ home.  4.  HL:  Cont statin.  LDL 55.  6.  CKD III:  Stable.  Follow post-contrast yesterday.  Signed, Murray Hodgkins, NP  05/31/2019, 9:50 AM    For questions or updates, please contact   Please consult [email or web address] for contact info under Cardiology/STEMI.

## 2019-05-31 NOTE — Progress Notes (Signed)
ANTICOAGULATION CONSULT NOTE - Initial Consult  Pharmacy Consult for Heparin  Indication: PE and DVT- Thrombolysis of the pulmonary embolism planned  No Known Allergies  Patient Measurements: Height: 5\' 8"  (172.7 cm) Weight: 192 lb 3.2 oz (87.2 kg) IBW/kg (Calculated) : 68.4 Heparin Dosing Weight: 87.1 kg   Vital Signs: Temp: 97.7 F (36.5 C) (06/19 0756) Temp Source: Oral (06/19 0756) BP: 141/81 (06/19 0756) Pulse Rate: 68 (06/19 0756)  Labs: Recent Labs    05/29/19 1600 05/29/19 1945 05/29/19 2152  05/30/19 0344 05/30/19 0955 05/30/19 2102 05/31/19 0559 05/31/19 1202  HGB 16.8  --   --   --  15.5  --   --  14.3  --   HCT 49.2  --   --   --  46.9  --   --  42.4  --   PLT 256  --   --   --  216  --   --  248  --   APTT  --  31  --   --   --   --   --   --   --   LABPROT  --  15.0  --   --   --   --   --   --   --   INR  --  1.2  --   --   --   --   --   --   --   HEPARINUNFRC  --   --   --    < >  --  <0.10* 0.21* 0.35 0.10*  CREATININE 1.70*  --   --   --  1.57*  --   --  1.51*  --   TROPONINI 0.06*  --  0.08*  --  0.06* 0.05*  --   --   --    < > = values in this interval not displayed.    Estimated Creatinine Clearance: 42.6 mL/min (A) (by C-G formula based on SCr of 1.51 mg/dL (H)).   Medical History: Past Medical History:  Diagnosis Date  . BPH (benign prostatic hyperplasia)   . CKD (chronic kidney disease), stage III ([place])   . Elevated blood uric acid level   . GERD (gastroesophageal reflux disease)   . History of echocardiogram    a.02/2016 Echo: EF 50-55%. Mild LVH. Mild AI. Triv MR/TR.  Marland Kitchen Hyperparathyroidism ([place])   . Hypertension   . Hypertriglyceridemia   . Mixed hyperlipidemia   . TIA (transient ischemic attack) 2013    Medications:  Medications Prior to Admission  Medication Sig Dispense Refill Last Dose  . carbidopa-levodopa (SINEMET IR) 25-100 MG tablet TAKE 3 TABLETS BY MOUTH 3 TIMES DAILY 270 tablet 11 05/28/2019 at Unknown time  .  fenofibrate 160 MG tablet Take 160 mg by mouth daily.    05/28/2019 at Unknown time  . oxybutynin (DITROPAN-XL) 5 MG 24 hr tablet Take 1 tablet (5 mg total) by mouth daily. 30 tablet 11 05/28/2019 at Unknown time  . VITAMIN D PO Take 400 Units by mouth daily.   05/28/2019 at Unknown time    Assessment: 78 y.o.malebelow listed past medical history presents the ER for progressive weakness and chest pain radiating through to his back that occurred suddenly on Monday. Pharmacy has been consulted for heparin drip initiation and monitoring for ACS/STEMI. Spoke with RN Mendel Ryder and heparin has been continuously running no pauses or breaks. Vascular consulted. Thrombolysis planned. Plan to discharge on apixaban.   6/18 @0056  HL <  0.1 Heparin 2600 units IV x 1 and will increase rate to 1200 units/hr  6/18 @0955  HL < 0.1Heparin 2600 units IV x 1 and will increase rate to 1450 units/hr 6/18 @2101  HL 0.21 Heparin 1300 units IV X 1 and increase drip rate to 1600 units/hr 6/18 @0600  HL 0.35 6/19 @1202  Hl < 0.1 Heparin 2600 units IV x 1 and increase rate to 1800 units/hr.   Goal of Therapy:  Heparin level 0.3-0.7 units/ml Monitor platelets by anticoagulation protocol: Yes   Plan:  Heparin level is subtherapeutic. Will rebolus heparin 2600 units IV x 1 and increase rate to 1800 units/hr. Recheck HL in 6 hours, CBC check w/ am labs.  Eleonore Chiquito, PharmD, BCPS 05/31/2019 1:06 PM

## 2019-06-01 DIAGNOSIS — R7989 Other specified abnormal findings of blood chemistry: Secondary | ICD-10-CM

## 2019-06-01 LAB — BASIC METABOLIC PANEL
Anion gap: 7 (ref 5–15)
BUN: 25 mg/dL — ABNORMAL HIGH (ref 8–23)
CO2: 22 mmol/L (ref 22–32)
Calcium: 8.6 mg/dL — ABNORMAL LOW (ref 8.9–10.3)
Chloride: 110 mmol/L (ref 98–111)
Creatinine, Ser: 1.46 mg/dL — ABNORMAL HIGH (ref 0.61–1.24)
GFR calc Af Amer: 52 mL/min — ABNORMAL LOW (ref >60)
GFR calc non Af Amer: 45 mL/min — ABNORMAL LOW (ref >60)
Glucose, Bld: 95 mg/dL (ref 70–99)
Potassium: 4.2 mmol/L (ref 3.5–5.1)
Sodium: 139 mmol/L (ref 135–145)

## 2019-06-01 LAB — CBC
HCT: 39.4 % (ref 39.0–52.0)
Hemoglobin: 12.9 g/dL — ABNORMAL LOW (ref 13.0–17.0)
MCH: 31.6 pg (ref 26.0–34.0)
MCHC: 32.7 g/dL (ref 30.0–36.0)
MCV: 96.6 fL (ref 80.0–100.0)
Platelets: 223 10*3/uL (ref 150–400)
RBC: 4.08 MIL/uL — ABNORMAL LOW (ref 4.22–5.81)
RDW: 12.7 % (ref 11.5–15.5)
WBC: 8.2 10*3/uL (ref 4.0–10.5)
nRBC: 0 % (ref 0.0–0.2)

## 2019-06-01 LAB — HEPARIN LEVEL (UNFRACTIONATED): Heparin Unfractionated: 0.38 IU/mL (ref 0.30–0.70)

## 2019-06-01 MED ORDER — AZITHROMYCIN 250 MG PO TABS: 500.0000 mg | ORAL_TABLET | Freq: Every day | ORAL | Status: DC

## 2019-06-01 MED ORDER — PANTOPRAZOLE SODIUM 40 MG PO TBEC
40.0000 mg | DELAYED_RELEASE_TABLET | Freq: Every day | ORAL | Status: DC
  Administered 2019-06-01: 40 mg via ORAL
  Filled 2019-06-01: qty 1

## 2019-06-01 MED ORDER — APIXABAN 5 MG PO TABS: 5.0000 mg | ORAL_TABLET | Freq: Two times a day (BID) | ORAL | Status: DC

## 2019-06-01 MED ORDER — APIXABAN 5 MG PO TABS: 10.0000 mg | ORAL_TABLET | Freq: Two times a day (BID) | ORAL | 1 refills | Status: AC

## 2019-06-01 MED ORDER — APIXABAN 5 MG PO TABS: 10.0000 mg | ORAL_TABLET | Freq: Two times a day (BID) | ORAL | 2 refills | Status: DC

## 2019-06-01 MED ORDER — APIXABAN 5 MG PO TABS
10.0000 mg | ORAL_TABLET | Freq: Two times a day (BID) | ORAL | Status: DC
  Administered 2019-06-01 (×2): 10 mg via ORAL
  Filled 2019-06-01 (×2): qty 2

## 2019-06-01 NOTE — Progress Notes (Signed)
ANTICOAGULATION CONSULT NOTE - Initial Consult  Pharmacy Consult for Heparin  Indication: PE and DVT- Thrombolysis of the pulmonary embolism planned  No Known Allergies  Patient Measurements: Height: 5\' 8"  (172.7 cm) Weight: 192 lb 3.2 oz (87.2 kg) IBW/kg (Calculated) : 68.4 Heparin Dosing Weight: 87.1 kg   Vital Signs: Temp: 98.5 F (36.9 C) (06/19 2040) Temp Source: Oral (06/19 2040) BP: 108/55 (06/19 2040) Pulse Rate: 81 (06/19 2040)  Labs: Recent Labs    05/29/19 1600 05/29/19 1945 05/29/19 2152  05/30/19 0344 05/30/19 0955  05/31/19 0559 05/31/19 1202 05/31/19 2204  HGB 16.8  --   --   --  15.5  --   --  14.3  --   --   HCT 49.2  --   --   --  46.9  --   --  42.4  --   --   PLT 256  --   --   --  216  --   --  248  --   --   APTT  --  31  --   --   --   --   --   --   --   --   LABPROT  --  15.0  --   --   --   --   --   --   --   --   INR  --  1.2  --   --   --   --   --   --   --   --   HEPARINUNFRC  --   --   --    < >  --  <0.10*   < > 0.35 0.10* 0.38  CREATININE 1.70*  --   --   --  1.57*  --   --  1.51*  --   --   TROPONINI 0.06*  --  0.08*  --  0.06* 0.05*  --   --   --   --    < > = values in this interval not displayed.    Estimated Creatinine Clearance: 42.6 mL/min (A) (by C-G formula based on SCr of 1.51 mg/dL (H)).   Medical History: Past Medical History:  Diagnosis Date  . BPH (benign prostatic hyperplasia)   . CKD (chronic kidney disease), stage III ([place])   . Elevated blood uric acid level   . GERD (gastroesophageal reflux disease)   . History of echocardiogram    a.02/2016 Echo: EF 50-55%. Mild LVH. Mild AI. Triv MR/TR.  Marland Kitchen Hyperparathyroidism ([place])   . Hypertension   . Hypertriglyceridemia   . Mixed hyperlipidemia   . TIA (transient ischemic attack) 2013    Medications:  Medications Prior to Admission  Medication Sig Dispense Refill Last Dose  . carbidopa-levodopa (SINEMET IR) 25-100 MG tablet TAKE 3 TABLETS BY MOUTH 3 TIMES DAILY  270 tablet 11 05/28/2019 at Unknown time  . fenofibrate 160 MG tablet Take 160 mg by mouth daily.    05/28/2019 at Unknown time  . oxybutynin (DITROPAN-XL) 5 MG 24 hr tablet Take 1 tablet (5 mg total) by mouth daily. 30 tablet 11 05/28/2019 at Unknown time  . VITAMIN D PO Take 400 Units by mouth daily.   05/28/2019 at Unknown time    Assessment: 78 y.o.malebelow listed past medical history presents the ER for progressive weakness and chest pain radiating through to his back that occurred suddenly on Monday. Pharmacy has been consulted for heparin drip initiation and monitoring for  ACS/STEMI. Spoke with RN Mendel Ryder and heparin has been continuously running no pauses or breaks. Vascular consulted. Thrombolysis planned. Plan to discharge on apixaban.   6/18 @0056  HL < 0.1 Heparin 2600 units IV x 1 and will increase rate to 1200 units/hr  6/18 @0955  HL < 0.1Heparin 2600 units IV x 1 and will increase rate to 1450 units/hr 6/18 @2101  HL 0.21 Heparin 1300 units IV X 1 and increase drip rate to 1600 units/hr 6/18 @0600  HL 0.35 6/19 @1202  Hl < 0.1 Heparin 2600 units IV x 1 and increase rate to 1800 units/hr.   Goal of Therapy:  Heparin level 0.3-0.7 units/ml Monitor platelets by anticoagulation protocol: Yes   Plan:  06/19 @ 2200 HL 0.38 therapeutic. Will continue current rate and recheck @ 0600, CBC trending down but stable will continue to monitor.  Tobie Lords, PharmD, BCPS Clinical Pharmacist 06/01/2019 3:35 AM

## 2019-06-01 NOTE — Evaluation (Signed)
Physical Therapy Evaluation Patient Details Name: Brendan Nguyen MRN: [id number] DOB: 15-Aug-1941 Today's Date: 06/01/2019   History of Present Illness  From MD H&P:  Pt is a 78 yo male wit a h/o BPH, GERD, HTN, HL, HTG, TIA, back pain, and CKD III, who was admitted 6/17 w/ abnl ecg, dyspnea, leg wkns, and mild troponin elevation and was subsequently found to have a saddle PE w/ R heart strain.  Pt is now s/p thrombolysis and thrombectomy.  Assessment includes: Bilateral pulmonary emboli with saddle embolus and acute Right lower extremity DVT, Elevated troponin secondary to pulmonary embolism, DM II, HLD, GERD, and CKD stage III.    Clinical Impression  Pt presents with deficits in strength, transfers, mobility, gait, balance, and activity tolerance.  Pt was Mod Ind with bed mobility tasks requiring extra time and effort but no physical assistance.  Pt was SBA during sit to/from stand transfer training from various surfaces demonstrating good eccentric and concentric control when given verbal cues for proper sequencing.  Pt was able to remain in standing during standing therex and while using a urinal for over 5 minutes with no adverse symptoms and with good stability.  Pt ambulated 10' with a RW with CGA with short B step length and a slow cadence that patient reported is baseline for him.  Pt benefited from verbal cues for amb closer to the RW with upright posture and although ambulated slowly was steady throughout with no LOB.  Pt's SpO2 remained 95-96% throughout the session with HR WNL and with pt reporting no adverse symptoms.  Of note pt was able to talk freely during therex and ambulation with no SOB noted.  Pt will benefit from HHPT services upon discharge to safely address above deficits for decreased caregiver assistance and eventual return to PLOF.      Follow Up Recommendations Home health PT;Supervision for mobility/OOB    Equipment Recommendations  None recommended by PT     Recommendations for Other Services       Precautions / Restrictions Precautions Precautions: Fall Restrictions Weight Bearing Restrictions: No      Mobility  Bed Mobility Overal bed mobility: Modified Independent             General bed mobility comments: Extra time and effort but no physical assistance required with bed mobility tasks  Transfers Overall transfer level: Needs assistance Equipment used: Rolling walker (2 wheeled) Transfers: Sit to/from Stand Sit to Stand: Supervision         General transfer comment: Good eccentric and concentric control during transfers with min verbal cues for proper sequencing.  Ambulation/Gait Ambulation/Gait assistance: Min guard Gait Distance (Feet): 10 Feet Assistive device: Rolling walker (2 wheeled) Gait Pattern/deviations: Step-through pattern;Decreased step length - left;Decreased stance time - right;Trunk flexed Gait velocity: decreased   General Gait Details: Slow cadence with short B step length with cues needed to amb closer to the RW with upright posture; pt overall steady with no LOB  Stairs            Wheelchair Mobility    Modified Rankin (Stroke Patients Only)       Balance Overall balance assessment: Mild deficits observed, not formally tested                                           Pertinent Vitals/Pain Pain Assessment: No/denies pain  Home Living Family/patient expects to be discharged to:: Private residence Living Arrangements: Spouse/significant other Available Help at Discharge: Family;Available 24 hours/day Type of Home: House Home Access: Stairs to enter Entrance Stairs-Rails: Right Entrance Stairs-Number of Steps: 3 Home Layout: One level Home Equipment: Walker - 2 wheels;Bedside commode      Prior Function Level of Independence: Independent with assistive device(s)         Comments: Mod Ind amb household distances with a RW     Hand Dominance         Extremity/Trunk Assessment   Upper Extremity Assessment Upper Extremity Assessment: Generalized weakness    Lower Extremity Assessment Lower Extremity Assessment: Generalized weakness       Communication   Communication: No difficulties  Cognition Arousal/Alertness: Awake/alert Behavior During Therapy: WFL for tasks assessed/performed Overall Cognitive Status: Within Functional Limits for tasks assessed                                        General Comments      Exercises Total Joint Exercises Ankle Circles/Pumps: AROM;Both;10 reps;15 reps Quad Sets: Strengthening;Both;10 reps;15 reps Gluteal Sets: Strengthening;Both;10 reps;15 reps Hip ABduction/ADduction: AROM;Both;10 reps Long Arc Quad: AROM;Strengthening;Both;10 reps Knee Flexion: AROM;Strengthening;Both;10 reps Marching in Standing: AROM;Both;10 reps;Standing Other Exercises Other Exercises: Sit to/from stand transfer training from various height surfaces with min verbal cues for sequencing Other Exercises: HEP education and review for BLE APs, QS, GS, and LAQ   Assessment/Plan    PT Assessment Patient needs continued PT services  PT Problem List Decreased strength;Decreased activity tolerance;Decreased balance;Decreased mobility       PT Treatment Interventions DME instruction;Gait training;Stair training;Functional mobility training;Therapeutic activities;Therapeutic exercise;Balance training;Patient/family education    PT Goals (Current goals can be found in the Care Plan section)  Acute Rehab PT Goals Patient Stated Goal: To get stronger and return home PT Goal Formulation: With patient Time For Goal Achievement: 06/14/19 Potential to Achieve Goals: Good    Frequency Min 2X/week   Barriers to discharge        Co-evaluation               AM-PAC PT "6 Clicks" Mobility  Outcome Measure Help needed turning from your back to your side while in a flat bed without using  bedrails?: None Help needed moving from lying on your back to sitting on the side of a flat bed without using bedrails?: None Help needed moving to and from a bed to a chair (including a wheelchair)?: A Little Help needed standing up from a chair using your arms (e.g., wheelchair or bedside chair)?: A Little Help needed to walk in hospital room?: A Little Help needed climbing 3-5 steps with a railing? : A Little 6 Click Score: 20    End of Session Equipment Utilized During Treatment: Gait belt Activity Tolerance: Patient tolerated treatment well Patient left: in chair;with chair alarm set;with call bell/phone within reach Nurse Communication: Mobility status PT Visit Diagnosis: Muscle weakness (generalized) (M62.81);Difficulty in walking, not elsewhere classified (R26.2)    Time: 1340-1419 PT Time Calculation (min) (ACUTE ONLY): 39 min   Charges:   PT Evaluation $PT Eval Low Complexity: 1 Low PT Treatments $Therapeutic Exercise: 8-22 mins        D. Scott Rylynne Schicker PT, DPT 06/01/19, 3:24 PM

## 2019-06-01 NOTE — Discharge Instructions (Signed)
Home health care (nursing, physical therapy, and Education officer, museum) through [employer].

## 2019-06-01 NOTE — Progress Notes (Signed)
PHARMACIST - PHYSICIAN COMMUNICATION  CONCERNING: Antibiotic IV to Oral Route Change Policy  RECOMMENDATION: This patient is receiving azithromycin by the intravenous route.  Based on criteria approved by the Pharmacy and Therapeutics Committee, the antibiotic(s) is/are being converted to the equivalent oral dose form(s).   DESCRIPTION: These criteria include:  Patient being treated for a respiratory tract infection, urinary tract infection, cellulitis or clostridium difficile associated diarrhea if on metronidazole  The patient is not neutropenic and does not exhibit a GI malabsorption state  The patient is eating (either orally or via tube) and/or has been taking other orally administered medications for a least 24 hours  The patient is improving clinically and has a Tmax < 100.5  If you have questions about this conversion, please contact the Pharmacy Department   []   [phone] )  [facility], [place]  06.20.20 [phone]

## 2019-06-01 NOTE — Progress Notes (Signed)
[facility]  ROUNDING NOTE   Subjective:   Patient states he is breathing better.   Dr. Delana Meyer did thrombolysis and thrombectomy yesterday 6/19  Objective:  Vital signs in last 24 hours:  Temp:  [97.6 F (36.4 C)-98.9 F (37.2 C)] 98.9 F (37.2 C) (06/20 0811) Pulse Rate:  [54-81] 62 (06/20 0811) Resp:  [11-24] 16 (06/20 0636) BP: (80-165)/(55-94) 158/78 (06/20 0811) SpO2:  [89 %-100 %] 97 % (06/20 0811)  Weight change:  Filed Weights   05/29/19 1549 05/30/19 0024  Weight: 90.7 kg 87.2 kg    Intake/Output: I/O last 3 completed shifts: In: 4988.3 [I.V.:933.2; IV [id number]:4580] Out: 998 [Urine:825]   Intake/Output this shift:  Total I/O In: 480 [P.O.:480] Out: -   Physical Exam: General: NAD,   Head: Normocephalic, atraumatic. Moist oral mucosal membranes  Eyes: Anicteric, PERRL  Neck: Supple, trachea midline  Lungs:  Clear to auscultation  Heart: Regular rate and rhythm  Abdomen:  Soft, nontender,   Extremities:  no peripheral edema.  Neurologic: Nonfocal, moving all four extremities  Skin: No lesions       Basic Metabolic Panel: Recent Labs  Lab 05/29/19 1600 05/30/19 0056 05/30/19 0344 05/31/19 0559 06/01/19 0752  NA 138  --  140 139 139  K 4.4  --  4.0 4.0 4.2  CL 104  --  108 110 110  CO2 23  --  22 22 22   GLUCOSE 144*  --  129* 122* 95  BUN 34*  --  33* 30* 25*  CREATININE 1.70*  --  1.57* 1.51* 1.46*  CALCIUM 9.8  --  9.4 9.0 8.6*  MG  --  2.2  --   --   --     Liver Function Tests: Recent Labs  Lab 05/29/19 1600  AST 18  ALT 10  ALKPHOS 39  BILITOT 1.0  PROT 7.1  ALBUMIN 4.0   No results for input(s): LIPASE, AMYLASE in the last 168 hours. No results for input(s): AMMONIA in the last 168 hours.  CBC: Recent Labs  Lab 05/29/19 1600 05/30/19 0344 05/31/19 0559 06/01/19 0752  WBC 15.3* 11.9* 10.8* 8.2  HGB 16.8 15.5 14.3 12.9*  HCT 49.2 46.9 42.4 39.4  MCV 93.7 95.1 92.8 96.6  PLT 256 216 248 223     Cardiac Enzymes: Recent Labs  Lab 05/29/19 1600 05/29/19 2152 05/30/19 0344 05/30/19 0955  TROPONINI 0.06* 0.08* 0.06* 0.05*    BNP: Invalid input(s): POCBNP  CBG: No results for input(s): GLUCAP in the last 168 hours.  Microbiology: Results for orders placed or performed during the hospital encounter of 05/29/19  SARS Coronavirus 2 (CEPHEID- Performed in [place] hospital lab), Hosp Order     Status: None   Collection Time: 05/29/19  4:23 PM   Specimen: Nasopharyngeal Swab  Result Value Ref Range Status   SARS Coronavirus 2 NEGATIVE NEGATIVE Final    Comment: (NOTE) If result is NEGATIVE SARS-CoV-2 target nucleic acids are NOT DETECTED. The SARS-CoV-2 RNA is generally detectable in upper and lower  respiratory specimens during the acute phase of infection. The lowest  concentration of SARS-CoV-2 viral copies this assay can detect is 250  copies / mL. A negative result does not preclude SARS-CoV-2 infection  and should not be used as the sole basis for treatment or other  patient management decisions.  A negative result may occur with  improper specimen collection / handling, submission of specimen other  than nasopharyngeal swab, presence of viral mutation(s)  within the  areas targeted by this assay, and inadequate number of viral copies  (<250 copies / mL). A negative result must be combined with clinical  observations, patient history, and epidemiological information. If result is POSITIVE SARS-CoV-2 target nucleic acids are DETECTED. The SARS-CoV-2 RNA is generally detectable in upper and lower  respiratory specimens dur ing the acute phase of infection.  Positive  results are indicative of active infection with SARS-CoV-2.  Clinical  correlation with patient history and other diagnostic information is  necessary to determine patient infection status.  Positive results do  not rule out bacterial infection or co-infection with other viruses. If result is  PRESUMPTIVE POSTIVE SARS-CoV-2 nucleic acids MAY BE PRESENT.   A presumptive positive result was obtained on the submitted specimen  and confirmed on repeat testing.  While 2019 novel coronavirus  (SARS-CoV-2) nucleic acids may be present in the submitted sample  additional confirmatory testing may be necessary for epidemiological  and / or clinical management purposes  to differentiate between  SARS-CoV-2 and other Sarbecovirus currently known to infect humans.  If clinically indicated additional testing with an alternate test  methodology [phone]) is advised. The SARS-CoV-2 RNA is generally  detectable in upper and lower respiratory sp ecimens during the acute  phase of infection. The expected result is Negative. Fact Sheet for Patients:  StrictlyIdeas.no Fact Sheet for Healthcare Providers: BankingDealers.co.za This test is not yet approved or cleared by the [place] FDA and has been authorized for detection and/or diagnosis of SARS-CoV-2 by FDA under an Emergency Use Authorization (EUA).  This EUA will remain in effect (meaning this test can be used) for the duration of the COVID-19 declaration under Section 564(b)(1) of the Act, 21 U.S.C. section 360bbb-3(b)(1), unless the authorization is terminated or revoked sooner. Performed at [facility], [address] [place], [place] [address]   Blood Culture (routine x 2)     Status: None (Preliminary result)   Collection Time: 05/29/19  6:07 PM   Specimen: BLOOD  Result Value Ref Range Status   Specimen Description BLOOD BLOOD LEFT HAND  Final   Special Requests   Final    BOTTLES DRAWN AEROBIC AND ANAEROBIC Blood Culture results may not be optimal due to an excessive volume of blood received in culture bottles   Culture   Final    NO GROWTH 3 DAYS Performed at [facility], [address] [place], [place] [address]    Report Status PENDING  Incomplete   Blood Culture (routine x 2)     Status: None (Preliminary result)   Collection Time: 05/29/19  6:09 PM   Specimen: BLOOD  Result Value Ref Range Status   Specimen Description BLOOD BLOOD RIGHT ARM  Final   Special Requests   Final    BOTTLES DRAWN AEROBIC AND ANAEROBIC Blood Culture results may not be optimal due to an excessive volume of blood received in culture bottles   Culture   Final    NO GROWTH 3 DAYS Performed at [facility], [address] [place], [place] [address]    Report Status PENDING  Incomplete    Coagulation Studies: Recent Labs    05/29/19 1945  LABPROT 15.0  INR 1.2    Urinalysis: No results for input(s): COLORURINE, LABSPEC, PHURINE, GLUCOSEU, HGBUR, BILIRUBINUR, KETONESUR, PROTEINUR, UROBILINOGEN, NITRITE, LEUKOCYTESUR in the last 72 hours.  Invalid input(s): APPERANCEUR    Imaging: Ct Angio Chest Pe W Or Wo Contrast  Addendum Date: 05/30/2019  ADDENDUM REPORT: 05/30/2019 19:49 ADDENDUM: These results were called by telephone at the time of interpretation on 05/30/2019 at 7:48 pm to RN Valere Dross, who verbally acknowledged these results. Multiple attempts were made by the reading physician and PRA to contact the ordering physician. These attempts were unsuccessful. RN endorsed understanding that this is a critical finding and large pulmonary embolus requiring immediate action. Electronically Signed   By: Constance Holster M.D.   On: 05/30/2019 19:49   Result Date: 05/30/2019 CLINICAL DATA:  Acute respiratory failure. EXAM: CT ANGIOGRAPHY CHEST WITH CONTRAST TECHNIQUE: Multidetector CT imaging of the chest was performed using the standard protocol during bolus administration of intravenous contrast. Multiplanar CT image reconstructions and MIPs were obtained to evaluate the vascular anatomy. CONTRAST:  58mL OMNIPAQUE IOHEXOL 350 MG/ML SOLN COMPARISON:  None. FINDINGS: Cardiovascular: There is a large saddle pulmonary embolus with extensive pulmonary  emboli involving the lobar, segmental, and subsegmental branches of virtually all lobes. There is right heart strain with an RV/LV ratio measuring approximately 1.3. The heart size is enlarged. There is reflux of contrast in the IVC consistent with right heart strain/failure Mediastinum/Nodes: No enlarged mediastinal, hilar, or axillary lymph nodes. Thyroid gland, trachea, and esophagus demonstrate no significant findings. Lungs/Pleura: There is atelectasis at the right lung base. There is a trace right-sided pleural effusion. There is an airspace opacity in the right middle lobe. There is no pneumothorax. The trachea is unremarkable. Upper Abdomen: There is cholelithiasis without CT evidence of acute cholecystitis involving the partially visualized gallbladder. There appears to be underlying hepatic steatosis. Musculoskeletal: No chest wall abnormality. No acute or significant osseous findings. Review of the MIP images confirms the above findings. IMPRESSION: 1. Extensive acute bilateral pulmonary emboli as detailed above including a saddle pulmonary embolus. There is CT evidence of right heart strain. Positive for acute PE with CT evidence of right heart strain (RV/LV Ratio = 1.3) consistent with at least submassive (intermediate risk) PE. The presence of right heart strain has been associated with an increased risk of morbidity and mortality. 2. Airspace opacity in the right middle lobe may represent infiltrate or developing pulmonary infarct in the setting of acute pulmonary emboli. 3. Trace right-sided pleural effusion. 4. Cardiomegaly. 5. Hepatic steatosis. 6. Cholelithiasis. Electronically Signed: By: Constance Holster M.D. On: 05/30/2019 19:27   US Venous Img Lower Bilateral  Result Date: 05/30/2019 CLINICAL DATA:  Bilateral pulmonary emboli EXAM: BILATERAL LOWER EXTREMITY VENOUS DOPPLER ULTRASOUND TECHNIQUE: Gray-scale sonography with graded compression, as well as color Doppler and duplex ultrasound  were performed to evaluate the lower extremity deep venous systems from the level of the common femoral vein and including the common femoral, femoral, profunda femoral, popliteal and calf veins including the posterior tibial, peroneal and gastrocnemius veins when visible. The superficial great saphenous vein was also interrogated. Spectral Doppler was utilized to evaluate flow at rest and with distal augmentation maneuvers in the common femoral, femoral and popliteal veins. COMPARISON:  None. FINDINGS: RIGHT LOWER EXTREMITY Common Femoral Vein: No evidence of thrombus. Normal compressibility, respiratory phasicity and response to augmentation. Saphenofemoral Junction: No evidence of thrombus. Normal compressibility and flow on color Doppler imaging. Profunda Femoral Vein: No evidence of thrombus. Normal compressibility and flow on color Doppler imaging. Femoral Vein: Occlusive thrombus noted in the distal femoral vein. Distal vessel is noncompressible. Popliteal Vein: Occlusive thrombus noted in the right popliteal vein. Calf Veins: Occlusive thrombus noted within the posterior tibial veins and 1 peroneal vein. Superficial Great Saphenous Vein: No evidence of  thrombus. Normal compressibility. Venous Reflux:  None. Other Findings:  None. LEFT LOWER EXTREMITY Common Femoral Vein: No evidence of thrombus. Normal compressibility, respiratory phasicity and response to augmentation. Saphenofemoral Junction: No evidence of thrombus. Normal compressibility and flow on color Doppler imaging. Profunda Femoral Vein: No evidence of thrombus. Normal compressibility and flow on color Doppler imaging. Femoral Vein: No evidence of thrombus. Normal compressibility, respiratory phasicity and response to augmentation. Popliteal Vein: No evidence of thrombus. Normal compressibility, respiratory phasicity and response to augmentation. Calf Veins: No evidence of thrombus. Normal compressibility and flow on color Doppler imaging.  Superficial Great Saphenous Vein: No evidence of thrombus. Normal compressibility. Venous Reflux:  None. Other Findings:  None. IMPRESSION: Occlusive thrombus within the right distal femoral vein, popliteal vein and calf veins. Electronically Signed   By: Rolm Baptise M.D.   On: 05/30/2019 23:55     Medications:   . sodium chloride     . apixaban  10 mg Oral BID   Followed by  . [START ON 06/08/2019] apixaban  5 mg Oral BID  . aspirin EC  81 mg Oral Daily  . atorvastatin  40 mg Oral q1800  . carbidopa-levodopa  2 tablet Oral TID  . cholecalciferol  500 Units Oral Daily  . fenofibrate  160 mg Oral Daily  . oxybutynin  5 mg Oral Daily  . pantoprazole  40 mg Oral Daily  . sodium chloride flush  3 mL Intravenous Once  . sodium chloride flush  3 mL Intravenous Q12H   sodium chloride, acetaminophen, ipratropium-albuterol, nitroGLYCERIN, ondansetron (ZOFRAN) IV, ondansetron (ZOFRAN) IV, sodium chloride flush  Assessment/ Plan:  [name] is a 78 y.o. white male with hyperlipidemia, hypertension, BPH, GERD, history of elevated uric acid level, chronic dizziness and imbalance   1. Chronic kidney disease stage III. Creatinine at baseline. Bland urine.  - IV fluids before and after IV contrast - Hold ACE-I/ARB and diuretics - Will need follow up labs this week.   2. Hypertension. On no blood pressure agents currently  3. Secondary hyperparathyroidism. Most recent PTH was 60. Not currently on vitamin D agents.   4. Pulmonary Embolism: status post thrombectomy/thrombolysis 6/19 Dr. Delana Meyer - apixaban   LOS: 3 Tyashia Morrisette 6/20/202011:44 AM

## 2019-06-01 NOTE — Progress Notes (Signed)
Progress Note  Patient Name: Brendan Nguyen Date of Encounter: 05/31/2019  Primary Cardiologist: Ida Rogue, MD  Subjective   Feels weak this am but better. No pain.   Inpatient Medications    Scheduled Meds:  aspirin EC  81 mg Oral Daily   atorvastatin  40 mg Oral q1800   carbidopa-levodopa  2 tablet Oral TID   cholecalciferol  500 Units Oral Daily   fenofibrate  160 mg Oral Daily   oxybutynin  5 mg Oral Daily   pantoprazole (PROTONIX) IV  40 mg Intravenous Q24H   sodium chloride flush  3 mL Intravenous Once   sodium chloride flush  3 mL Intravenous Q12H   Continuous Infusions:  sodium chloride     sodium chloride 100 mL/hr at 05/31/19 0708   azithromycin 500 mg (05/30/19 2141)   cefTRIAXone (ROCEPHIN)  IV 2 g (05/30/19 1806)   heparin 1,600 Units/hr (05/31/19 0319)   PRN Meds: sodium chloride, acetaminophen, ipratropium-albuterol, nitroGLYCERIN, ondansetron (ZOFRAN) IV, sodium chloride flush   Vital Signs    Vitals:   05/30/19 1616 05/30/19 2021 05/31/19 0554 05/31/19 0756  BP: 134/87 138/78 129/85 (!) 141/81  Pulse: 87 69 (!) 59 68  Resp:      Temp: 98.1 F (36.7 C) 98.4 F (36.9 C) 98.7 F (37.1 C) 97.7 F (36.5 C)  TempSrc: Oral Oral Oral Oral  SpO2: 91% 90% 95% 96%  Weight:      Height:        Intake/Output Summary (Last 24 hours) at 05/31/2019 0950 Last data filed at 05/31/2019 0601 Gross per 24 hour  Intake --  Output 300 ml  Net -300 ml   Filed Weights   05/29/19 1549 05/30/19 0024  Weight: 90.7 kg 87.2 kg    Physical Exam    General: Well developed, well nourished, NAD  HEENT: OP clear, mucus membranes moist  SKIN: warm, dry. No rashes. Neuro: No focal deficits  Musculoskeletal: Muscle strength 5/5 all ext  Psychiatric: Mood and affect normal  Neck: No JVD, no carotid bruits, no thyromegaly, no lymphadenopathy.  Lungs:Clear bilaterally, no wheezes, rhonci, crackles Cardiovascular: Regular rate and rhythm. No  murmurs, gallops or rubs. Abdomen:Soft. Bowel sounds present. Non-tender.  Extremities: No lower extremity edema. Pulses are 2 + in the bilateral DP/PT.  Labs    Chemistry Recent Labs  Lab 05/29/19 1600 05/30/19 0344 05/31/19 0559  NA 138 140 139  K 4.4 4.0 4.0  CL 104 108 110  CO2 23 22 22   GLUCOSE 144* 129* 122*  BUN 34* 33* 30*  CREATININE 1.70* 1.57* 1.51*  CALCIUM 9.8 9.4 9.0  PROT 7.1  --   --   ALBUMIN 4.0  --   --   AST 18  --   --   ALT 10  --   --   ALKPHOS 39  --   --   BILITOT 1.0  --   --   GFRNONAA 38* 41* 43*  GFRAA 43* 48* 50*  ANIONGAP 11 10 7      Hematology Recent Labs  Lab 05/29/19 1600 05/30/19 0344 05/31/19 0559  WBC 15.3* 11.9* 10.8*  RBC 5.25 4.93 4.57  HGB 16.8 15.5 14.3  HCT 49.2 46.9 42.4  MCV 93.7 95.1 92.8  MCH 32.0 31.4 31.3  MCHC 34.1 33.0 33.7  RDW 13.0 12.9 12.9  PLT 256 216 248    Cardiac Enzymes Recent Labs  Lab 05/29/19 1600 05/29/19 2152 05/30/19 0344 05/30/19 0955  TROPONINI 0.06*  0.08* 0.06* 0.05*     BNP Recent Labs  Lab 05/29/19 1600  BNP 716.0*      Radiology    Ct Angio Chest Pe W Or Wo Contrast  Addendum Date: 05/30/2019   ADDENDUM REPORT: 05/30/2019 19:49 ADDENDUM: These results were called by telephone at the time of interpretation on 05/30/2019 at 7:48 pm to RN Valere Dross, who verbally acknowledged these results. Multiple attempts were made by the reading physician and PRA to contact the ordering physician. These attempts were unsuccessful. RN endorsed understanding that this is a critical finding and large pulmonary embolus requiring immediate action. Electronically Signed   By: Constance Holster M.D.   On: 05/30/2019 19:49   Result Date: 05/30/2019 CLINICAL DATA:  Acute respiratory failure. EXAM: CT ANGIOGRAPHY CHEST WITH CONTRAST TECHNIQUE: Multidetector CT imaging of the chest was performed using the standard protocol during bolus administration of intravenous contrast. Multiplanar CT image  reconstructions and MIPs were obtained to evaluate the vascular anatomy. CONTRAST:  30mL OMNIPAQUE IOHEXOL 350 MG/ML SOLN COMPARISON:  None. FINDINGS: Cardiovascular: There is a large saddle pulmonary embolus with extensive pulmonary emboli involving the lobar, segmental, and subsegmental branches of virtually all lobes. There is right heart strain with an RV/LV ratio measuring approximately 1.3. The heart size is enlarged. There is reflux of contrast in the IVC consistent with right heart strain/failure Mediastinum/Nodes: No enlarged mediastinal, hilar, or axillary lymph nodes. Thyroid gland, trachea, and esophagus demonstrate no significant findings. Lungs/Pleura: There is atelectasis at the right lung base. There is a trace right-sided pleural effusion. There is an airspace opacity in the right middle lobe. There is no pneumothorax. The trachea is unremarkable. Upper Abdomen: There is cholelithiasis without CT evidence of acute cholecystitis involving the partially visualized gallbladder. There appears to be underlying hepatic steatosis. Musculoskeletal: No chest wall abnormality. No acute or significant osseous findings. Review of the MIP images confirms the above findings. IMPRESSION: 1. Extensive acute bilateral pulmonary emboli as detailed above including a saddle pulmonary embolus. There is CT evidence of right heart strain. Positive for acute PE with CT evidence of right heart strain (RV/LV Ratio = 1.3) consistent with at least submassive (intermediate risk) PE. The presence of right heart strain has been associated with an increased risk of morbidity and mortality. 2. Airspace opacity in the right middle lobe may represent infiltrate or developing pulmonary infarct in the setting of acute pulmonary emboli. 3. Trace right-sided pleural effusion. 4. Cardiomegaly. 5. Hepatic steatosis. 6. Cholelithiasis. Electronically Signed: By: Constance Holster M.D. On: 05/30/2019 19:27   US Venous Img Lower  Bilateral  Result Date: 05/30/2019 CLINICAL DATA:  Bilateral pulmonary emboli EXAM: BILATERAL LOWER EXTREMITY VENOUS DOPPLER ULTRASOUND TECHNIQUE: Gray-scale sonography with graded compression, as well as color Doppler and duplex ultrasound were performed to evaluate the lower extremity deep venous systems from the level of the common femoral vein and including the common femoral, femoral, profunda femoral, popliteal and calf veins including the posterior tibial, peroneal and gastrocnemius veins when visible. The superficial great saphenous vein was also interrogated. Spectral Doppler was utilized to evaluate flow at rest and with distal augmentation maneuvers in the common femoral, femoral and popliteal veins. COMPARISON:  None. FINDINGS: RIGHT LOWER EXTREMITY Common Femoral Vein: No evidence of thrombus. Normal compressibility, respiratory phasicity and response to augmentation. Saphenofemoral Junction: No evidence of thrombus. Normal compressibility and flow on color Doppler imaging. Profunda Femoral Vein: No evidence of thrombus. Normal compressibility and flow on color Doppler imaging. Femoral Vein: Occlusive thrombus noted  in the distal femoral vein. Distal vessel is noncompressible. Popliteal Vein: Occlusive thrombus noted in the right popliteal vein. Calf Veins: Occlusive thrombus noted within the posterior tibial veins and 1 peroneal vein. Superficial Great Saphenous Vein: No evidence of thrombus. Normal compressibility. Venous Reflux:  None. Other Findings:  None. LEFT LOWER EXTREMITY Common Femoral Vein: No evidence of thrombus. Normal compressibility, respiratory phasicity and response to augmentation. Saphenofemoral Junction: No evidence of thrombus. Normal compressibility and flow on color Doppler imaging. Profunda Femoral Vein: No evidence of thrombus. Normal compressibility and flow on color Doppler imaging. Femoral Vein: No evidence of thrombus. Normal compressibility, respiratory phasicity and  response to augmentation. Popliteal Vein: No evidence of thrombus. Normal compressibility, respiratory phasicity and response to augmentation. Calf Veins: No evidence of thrombus. Normal compressibility and flow on color Doppler imaging. Superficial Great Saphenous Vein: No evidence of thrombus. Normal compressibility. Venous Reflux:  None. Other Findings:  None. IMPRESSION: Occlusive thrombus within the right distal femoral vein, popliteal vein and calf veins. Electronically Signed   By: Rolm Baptise M.D.   On: 05/30/2019 23:55   Dg Chest Portable 1 View  Result Date: 05/29/2019 CLINICAL DATA:  Low back pain.  Shortness of breath. EXAM: PORTABLE CHEST 1 VIEW COMPARISON:  None. FINDINGS: Mild opacity in the lateral right lung base. The heart, hila, mediastinum, lungs, and pleura are otherwise normal. IMPRESSION: Mild opacity in the lateral right lung base may represent infiltrate or atelectasis. Recommend short-term follow-up to ensure resolution. Electronically Signed   By: Dorise Bullion III M.D   On: 05/29/2019 16:45    Telemetry    Sinus brady - sinus rhythm - Personally Reviewed  Cardiac Studies   2D Echocardiogram 6.18.2020   1. The left ventricle has normal systolic function, with an ejection fraction of 55-60%. The cavity size was normal. Left ventricular diastolic Doppler parameters are indeterminate.Unable to exclude regional wall motion abnormality.  2. The right ventricle has moderately reduced systolic function. The cavity was moderately enlarged. There is mildly increased right ventricular wall thickness. Right ventricular systolic pressure could not be assessed.  3. Challenging images   Patient Profile     78 y.o. male ? w/ a h/o BPH, GERD, HTN, HL, HTG, TIA, back pain, and CKD III, who was admitted 6/17 w/ abnl ecg, dyspnea, leg wkns, and mild troponin elevation and was subsequently found to have a saddle PE w/ R heart strain. He is now s/p thrombectomy per Vascular surgery on  05/31/19.   Assessment & Plan    1.  Right LE DVT/Acute Saddle PE:  Admitted w/ dyspnea and abnl ecg.  Echo showed nl LVEF w/ dilated RV concerning for PE.  CTA confirmed saddle PE.  This likely is why his troponin was elevated.   -Continue IV heparin.  -Change to DOAC when no further invasive procedures are needed.   Cardiology will sign off. Please call with questions.   For questions or updates, please contact   Please consult [email or web address] for contact info under Cardiology/STEMI.   Lauree Chandler 06/01/2019 9:02 AM

## 2019-06-01 NOTE — TOC Transition Note (Addendum)
Transition of Care [facility]) - CM/SW Discharge Note   Patient Details  Name: Brendan Nguyen MRN: [id number] Date of Birth: 1941-11-21  [facility] CM/SW Contact:  Latanya Maudlin, RN Phone Number: 06/01/2019, 2:46 PM   Clinical Narrative:  Patient to be discharged per MD order. Orders in place for home health services. Patient lives with his spouse. Patient uses a rolling walker at home. CMS Medicare.gov Compare Post Acute Care list reviewed with patient and they have no preference of agency. Referral placed with Malachy Mood at [facility].   Eliquis coupon given     Final next level of care: [place] Barriers to Discharge: No Barriers Identified   Patient Goals and CMS Choice Patient states their goals for this hospitalization and ongoing recovery are:: to get out of here CMS Medicare.gov Compare Post Acute Care list provided to:: Patient Choice offered to / list presented to : Patient  Discharge Placement                       Discharge Plan and Services   Discharge Planning Services: CM Consult                      HH Arranged: PT [facility] Agency: [place] Date [place]: 06/01/19 Time HH Agency Contacted: [id number]    Social Determinants of Health (SDOH) Interventions     Readmission Risk Interventions Readmission Risk Prevention Plan 06/01/2019  Transportation Screening Complete  PCP or Specialist Appt within 5-7 Days Complete  Home Care Screening Complete  Medication Review (RN CM) Complete  Some recent data might be hidden

## 2019-06-01 NOTE — Plan of Care (Signed)
Pt ready for discharge home with Home Health PT.   Problem: Cardiac: Goal: Ability to achieve and maintain adequate cardiovascular perfusion will improve Outcome: Completed/Met   Problem: Health Behavior/Discharge Planning: Goal: Ability to safely manage health-related needs after discharge will improve Outcome: Completed/Met   Problem: Education: Goal: Knowledge of General Education information will improve Description: Including pain rating scale, medication(s)/side effects and non-pharmacologic comfort measures Outcome: Completed/Met   Problem: Health Behavior/Discharge Planning: Goal: Ability to manage health-related needs will improve Outcome: Completed/Met   Problem: Clinical Measurements: Goal: Ability to maintain clinical measurements within normal limits will improve Outcome: Completed/Met Goal: Will remain free from infection Outcome: Completed/Met Goal: Diagnostic test results will improve Outcome: Completed/Met Goal: Respiratory complications will improve Outcome: Completed/Met Goal: Cardiovascular complication will be avoided Outcome: Completed/Met   Problem: Activity: Goal: Risk for activity intolerance will decrease Outcome: Completed/Met   Problem: Coping: Goal: Level of anxiety will decrease Outcome: Completed/Met   Problem: Elimination: Goal: Will not experience complications related to bowel motility Outcome: Completed/Met Goal: Will not experience complications related to urinary retention Outcome: Completed/Met   Problem: Safety: Goal: Ability to remain free from injury will improve Outcome: Completed/Met   Problem: Acute Rehab PT Goals(only PT should resolve) Goal: Pt Will Transfer Bed To Chair/Chair To Bed Outcome: Completed/Met Goal: Pt Will Ambulate Outcome: Completed/Met Goal: Pt Will Go Up/Down Stairs Outcome: Completed/Met   

## 2019-06-01 NOTE — Progress Notes (Signed)
PHARMACIST - PHYSICIAN COMMUNICATION  CONCERNING: IV to Oral Route Change Policy  RECOMMENDATION: This patient is receiving pantoprazole by the intravenous route.  Based on criteria approved by the Pharmacy and Therapeutics Committee, the intravenous medication(s) is/are being converted to the equivalent oral dose form(s).   DESCRIPTION: These criteria include:  The patient is eating (either orally or via tube) and/or has been taking other orally administered medications for a least 24 hours  The patient has no evidence of active gastrointestinal bleeding or impaired GI absorption (gastrectomy, short bowel, patient on TNA or NPO).  If you have questions about this conversion, please contact the Pharmacy Department   []   [phone] )  [place], [facility] 06/01/2019 8:07 AM

## 2019-06-01 NOTE — Progress Notes (Signed)
ANTICOAGULATION CONSULT NOTE  Pharmacy Consult for Heparin  Indication: PE and DVT- Thrombolysis of the pulmonary embolism planned  No Known Allergies  Patient Measurements: Height: 5\' 8"  (172.7 cm) Weight: 192 lb 3.2 oz (87.2 kg) IBW/kg (Calculated) : 68.4 Heparin Dosing Weight: 87.1 kg   Vital Signs: Temp: 98.9 F (37.2 C) (06/20 0811) Temp Source: Oral (06/20 0811) BP: 158/78 (06/20 0811) Pulse Rate: 62 (06/20 0811)  Labs: Recent Labs    05/29/19 1945 05/29/19 2152  05/30/19 0344 05/30/19 0955  05/31/19 0559 05/31/19 1202 05/31/19 2204 06/01/19 0752  HGB  --   --   --  15.5  --   --  14.3  --   --  12.9*  HCT  --   --   --  46.9  --   --  42.4  --   --  39.4  PLT  --   --   --  216  --   --  248  --   --  223  APTT 31  --   --   --   --   --   --   --   --   --   LABPROT 15.0  --   --   --   --   --   --   --   --   --   INR 1.2  --   --   --   --   --   --   --   --   --   HEPARINUNFRC  --   --    < >  --  <0.10*   < > 0.35 0.10* 0.38 0.38  CREATININE  --   --   --  1.57*  --   --  1.51*  --   --  1.46*  TROPONINI  --  0.08*  --  0.06* 0.05*  --   --   --   --   --    < > = values in this interval not displayed.    Estimated Creatinine Clearance: 44 mL/min (A) (by C-G formula based on SCr of 1.46 mg/dL (H)).   Medical History: Past Medical History:  Diagnosis Date  . BPH (benign prostatic hyperplasia)   . CKD (chronic kidney disease), stage III ([place])   . Elevated blood uric acid level   . GERD (gastroesophageal reflux disease)   . History of echocardiogram    a.02/2016 Echo: EF 50-55%. Mild LVH. Mild AI. Triv MR/TR.  Marland Kitchen Hyperparathyroidism ([place])   . Hypertension   . Hypertriglyceridemia   . Mixed hyperlipidemia   . TIA (transient ischemic attack) 2013    Medications:  Medications Prior to Admission  Medication Sig Dispense Refill Last Dose  . carbidopa-levodopa (SINEMET IR) 25-100 MG tablet TAKE 3 TABLETS BY MOUTH 3 TIMES DAILY 270 tablet 11  05/28/2019 at Unknown time  . fenofibrate 160 MG tablet Take 160 mg by mouth daily.    05/28/2019 at Unknown time  . oxybutynin (DITROPAN-XL) 5 MG 24 hr tablet Take 1 tablet (5 mg total) by mouth daily. 30 tablet 11 05/28/2019 at Unknown time  . VITAMIN D PO Take 400 Units by mouth daily.   05/28/2019 at Unknown time    Assessment: 78 y.o.malebelow listed past medical history presents the ER for progressive weakness and chest pain radiating through to his back that occurred suddenly on Monday. Pharmacy has been consulted for heparin drip initiation and monitoring for PE/DVT.Vascular consulted. Thrombolysis  performed 6/19. Plan to discharge on apixaban. Patient currently receiving heparin at 1800 units/hr.    Goal of Therapy:  Heparin level 0.3-0.7 units/ml Monitor platelets by anticoagulation protocol: Yes   Plan:  Continue heparin at 1800 units/hr. Will obtain next anti-Xa level and CBC with am labs.   Pharmacy will continue to monitor and adjust per consult.   Currie Paris, RPh 06/01/2019 8:45 AM

## 2019-06-01 NOTE — Progress Notes (Signed)
Patient ID: Brendan Nguyen, male   DOB: 05/07/1941, 78 y.o.   MRN: [id number] patient was seen by physical therapy. He did reasonably well. Patient will need home health physical therapy will child arrange. Discussed with care manager to call patient's wife and arrange home health PT. Will discharge patient home after bowel arrangements made.

## 2019-06-01 NOTE — Discharge Summary (Signed)
[facility] NAME: Brendan Nguyen    MR#:  [id number]  DATE OF BIRTH:  1941-04-01  DATE OF ADMISSION:  05/29/2019 ADMITTING PHYSICIAN: Christel Mormon, MD  DATE OF DISCHARGE: 06/01/2019  PRIMARY CARE PHYSICIAN: Dion Body, MD    ADMISSION DIAGNOSIS:  Pain [R52] Acute respiratory failure with hypoxia ([place]) [J96.01]  DISCHARGE DIAGNOSIS:  Acute hypoxic respiratory failure secondary to saddle embolus status post thrombolysis-- now on oral anticoagulation acute DVT right lower extremity occlusive thrombus within the right distal femoral vein, popliteal vein and calf veins.  SECONDARY DIAGNOSIS:   Past Medical History:  Diagnosis Date  . BPH (benign prostatic hyperplasia)   . CKD (chronic kidney disease), stage III ([place])   . Elevated blood uric acid level   . GERD (gastroesophageal reflux disease)   . History of echocardiogram    a.02/2016 Echo: EF 50-55%. Mild LVH. Mild AI. Triv MR/TR.  Marland Kitchen Hyperparathyroidism ([place])   . Hypertension   . Hypertriglyceridemia   . Mixed hyperlipidemia   . TIA (transient ischemic attack) 2013    HOSPITAL COURSE:  78 y.o. male ? w/ a h/o BPH, GERD, HTN, HL, HTG, TIA, back pain, and CKD III, who was admitted 6/17 w/ abnl ecg, dyspnea, leg wkns, and mild troponin elevation and was subsequently found to have a saddle PE w/ R heart strain.  *Bilateral pulmonary emboli with saddle embolus and acute Right lower extremity DVT. -  Consulted vascular surgery Dr Delana Meyer.   -Presently on heparin drip--swtiched to po eliquis.  -s/p Thrombolysis of the pulmonary embolism by Dr Ronalee Belts on June 19th-- patient tolerated procedure well.   -hemodynamically stable. Room air sets are 96 or 97% -CT chest shows evidence of pulmonary infarct. No evidence of infection at present. -Shortness of breath much improved. Will ambulate patient. He uses a walker at home according to the wife  *Elevated troponin secondary  to pulmonary embolism.  Stable -seen by cardiology. Echo results noted. EF 60 to 65%. -No further workup recommended by cardiology  * Diabetes mellitus-2  -sliding scale insulin -patient's sugars are less than 125 -continue monitoring and dietary advice given. Patient currently not on any medications  *hyperlipidemia  -continue fenofibrate  * GERD -PPI   *CKD3 Stable Creat 1.4  If patient does well with ambulation will discharge home later. Wife Mrs. Lampert was informed on the phone and she is agreeable with the plan.   CONSULTS OBTAINED:  Treatment Team:  Lavonia Dana, MD Schnier, Dolores Lory, MD  DRUG ALLERGIES:  No Known Allergies  DISCHARGE MEDICATIONS:   Allergies as of 06/01/2019   No Known Allergies     Medication List    TAKE these medications   apixaban 5 MG Tabs tablet Commonly known as: ELIQUIS Take 2 tablets (10 mg total) by mouth 2 (two) times daily. Then take 5 mg (1 tab) bid  Starting 06/08/2019   carbidopa-levodopa 25-100 MG tablet Commonly known as: SINEMET IR TAKE 3 TABLETS BY MOUTH 3 TIMES DAILY   fenofibrate 160 MG tablet Take 160 mg by mouth daily.   oxybutynin 5 MG 24 hr tablet Commonly known as: DITROPAN-XL Take 1 tablet (5 mg total) by mouth daily.   VITAMIN D PO Take 400 Units by mouth daily.       If you experience worsening of your admission symptoms, develop shortness of breath, life threatening emergency, suicidal or homicidal thoughts you must seek medical attention immediately by calling 911 or  calling your MD immediately  if symptoms less severe.  You Must read complete instructions/literature along with all the possible adverse reactions/side effects for all the Medicines you take and that have been prescribed to you. Take any new Medicines after you have completely understood and accept all the possible adverse reactions/side effects.   Please note  You were cared for by a hospitalist during your hospital stay. If  you have any questions about your discharge medications or the care you received while you were in the hospital after you are discharged, you can call the unit and asked to speak with the hospitalist on call if the hospitalist that took care of you is not available. Once you are discharged, your primary care physician will handle any further medical issues. Please note that NO REFILLS for any discharge medications will be authorized once you are discharged, as it is imperative that you return to your primary care physician (or establish a relationship with a primary care physician if you do not have one) for your aftercare needs so that they can reassess your need for medications and monitor your lab values. Today   SUBJECTIVE   Feels a whole lot better. Breathing much improved.  VITAL SIGNS:  Blood pressure (!) 158/78, pulse 62, temperature 98.9 F (37.2 C), temperature source Oral, resp. rate 16, height 5\' 8"  (1.727 m), weight 87.2 kg, SpO2 97 %.  I/O:    Intake/Output Summary (Last 24 hours) at 06/01/2019 1059 Last data filed at 06/01/2019 1056 Gross per 24 hour  Intake 5468.27 ml  Output 300 ml  Net 5168.27 ml    PHYSICAL EXAMINATION:  GENERAL:  78 y.o.-year-old patient lying in the bed with no acute distress.  EYES: Pupils equal, round, reactive to light and accommodation. No scleral icterus. Extraocular muscles intact.  HEENT: Head atraumatic, normocephalic. Oropharynx and nasopharynx clear.  NECK:  Supple, no jugular venous distention. No thyroid enlargement, no tenderness.  LUNGS: Normal breath sounds bilaterally, no wheezing, rales,rhonchi or crepitation. No use of accessory muscles of respiration.  CARDIOVASCULAR: S1, S2 normal. No murmurs, rubs, or gallops.  ABDOMEN: Soft, non-tender, non-distended. Bowel sounds present. No organomegaly or mass.  EXTREMITIES: No pedal edema, cyanosis, or clubbing.  NEUROLOGIC: Cranial nerves II through XII are intact. Muscle strength 5/5 in  all extremities. Sensation intact. Gait not checked.  PSYCHIATRIC: The patient is alert and oriented x 3.  SKIN: No obvious rash, lesion, or ulcer.   DATA REVIEW:   CBC  Recent Labs  Lab 06/01/19 0752  WBC 8.2  HGB 12.9*  HCT 39.4  PLT 223    Chemistries  Recent Labs  Lab 05/29/19 1600 05/30/19 0056  06/01/19 0752  NA 138  --    < > 139  K 4.4  --    < > 4.2  CL 104  --    < > 110  CO2 23  --    < > 22  GLUCOSE 144*  --    < > 95  BUN 34*  --    < > 25*  CREATININE 1.70*  --    < > 1.46*  CALCIUM 9.8  --    < > 8.6*  MG  --  2.2  --   --   AST 18  --   --   --   ALT 10  --   --   --   ALKPHOS 39  --   --   --   BILITOT 1.0  --   --   --    < > =  values in this interval not displayed.    Microbiology Results   Recent Results (from the past 240 hour(s))  SARS Coronavirus 2 (CEPHEID- Performed in [place] hospital lab), Hosp Order     Status: None   Collection Time: 05/29/19  4:23 PM   Specimen: Nasopharyngeal Swab  Result Value Ref Range Status   SARS Coronavirus 2 NEGATIVE NEGATIVE Final    Comment: (NOTE) If result is NEGATIVE SARS-CoV-2 target nucleic acids are NOT DETECTED. The SARS-CoV-2 RNA is generally detectable in upper and lower  respiratory specimens during the acute phase of infection. The lowest  concentration of SARS-CoV-2 viral copies this assay can detect is 250  copies / mL. A negative result does not preclude SARS-CoV-2 infection  and should not be used as the sole basis for treatment or other  patient management decisions.  A negative result may occur with  improper specimen collection / handling, submission of specimen other  than nasopharyngeal swab, presence of viral mutation(s) within the  areas targeted by this assay, and inadequate number of viral copies  (<250 copies / mL). A negative result must be combined with clinical  observations, patient history, and epidemiological information. If result is POSITIVE SARS-CoV-2 target  nucleic acids are DETECTED. The SARS-CoV-2 RNA is generally detectable in upper and lower  respiratory specimens dur ing the acute phase of infection.  Positive  results are indicative of active infection with SARS-CoV-2.  Clinical  correlation with patient history and other diagnostic information is  necessary to determine patient infection status.  Positive results do  not rule out bacterial infection or co-infection with other viruses. If result is PRESUMPTIVE POSTIVE SARS-CoV-2 nucleic acids MAY BE PRESENT.   A presumptive positive result was obtained on the submitted specimen  and confirmed on repeat testing.  While 2019 novel coronavirus  (SARS-CoV-2) nucleic acids may be present in the submitted sample  additional confirmatory testing may be necessary for epidemiological  and / or clinical management purposes  to differentiate between  SARS-CoV-2 and other Sarbecovirus currently known to infect humans.  If clinically indicated additional testing with an alternate test  methodology [phone]) is advised. The SARS-CoV-2 RNA is generally  detectable in upper and lower respiratory sp ecimens during the acute  phase of infection. The expected result is Negative. Fact Sheet for Patients:  StrictlyIdeas.no Fact Sheet for Healthcare Providers: BankingDealers.co.za This test is not yet approved or cleared by the [place] FDA and has been authorized for detection and/or diagnosis of SARS-CoV-2 by FDA under an Emergency Use Authorization (EUA).  This EUA will remain in effect (meaning this test can be used) for the duration of the COVID-19 declaration under Section 564(b)(1) of the Act, 21 U.S.C. section 360bbb-3(b)(1), unless the authorization is terminated or revoked sooner. Performed at [facility], [address] [place], [place] [address]   Blood Culture (routine x 2)     Status: None (Preliminary result)   Collection  Time: 05/29/19  6:07 PM   Specimen: BLOOD  Result Value Ref Range Status   Specimen Description BLOOD BLOOD LEFT HAND  Final   Special Requests   Final    BOTTLES DRAWN AEROBIC AND ANAEROBIC Blood Culture results may not be optimal due to an excessive volume of blood received in culture bottles   Culture   Final    NO GROWTH 3 DAYS Performed at [facility], [address], [place], [place] [address]    Report Status PENDING  Incomplete  Blood Culture (routine x 2)     Status: None (Preliminary result)   Collection Time: 05/29/19  6:09 PM   Specimen: BLOOD  Result Value Ref Range Status   Specimen Description BLOOD BLOOD RIGHT ARM  Final   Special Requests   Final    BOTTLES DRAWN AEROBIC AND ANAEROBIC Blood Culture results may not be optimal due to an excessive volume of blood received in culture bottles   Culture   Final    NO GROWTH 3 DAYS Performed at [facility], [address] [place],  [address]    Report Status PENDING  Incomplete    RADIOLOGY:  Ct Angio Chest Pe W Or Wo Contrast  Addendum Date: 05/30/2019   ADDENDUM REPORT: 05/30/2019 19:49 ADDENDUM: These results were called by telephone at the time of interpretation on 05/30/2019 at 7:48 pm to RN Valere Dross, who verbally acknowledged these results. Multiple attempts were made by the reading physician and PRA to contact the ordering physician. These attempts were unsuccessful. RN endorsed understanding that this is a critical finding and large pulmonary embolus requiring immediate action. Electronically Signed   By: Constance Holster M.D.   On: 05/30/2019 19:49   Result Date: 05/30/2019 CLINICAL DATA:  Acute respiratory failure. EXAM: CT ANGIOGRAPHY CHEST WITH CONTRAST TECHNIQUE: Multidetector CT imaging of the chest was performed using the standard protocol during bolus administration of intravenous contrast. Multiplanar CT image reconstructions and MIPs were obtained to evaluate the vascular anatomy.  CONTRAST:  83mL OMNIPAQUE IOHEXOL 350 MG/ML SOLN COMPARISON:  None. FINDINGS: Cardiovascular: There is a large saddle pulmonary embolus with extensive pulmonary emboli involving the lobar, segmental, and subsegmental branches of virtually all lobes. There is right heart strain with an RV/LV ratio measuring approximately 1.3. The heart size is enlarged. There is reflux of contrast in the IVC consistent with right heart strain/failure Mediastinum/Nodes: No enlarged mediastinal, hilar, or axillary lymph nodes. Thyroid gland, trachea, and esophagus demonstrate no significant findings. Lungs/Pleura: There is atelectasis at the right lung base. There is a trace right-sided pleural effusion. There is an airspace opacity in the right middle lobe. There is no pneumothorax. The trachea is unremarkable. Upper Abdomen: There is cholelithiasis without CT evidence of acute cholecystitis involving the partially visualized gallbladder. There appears to be underlying hepatic steatosis. Musculoskeletal: No chest wall abnormality. No acute or significant osseous findings. Review of the MIP images confirms the above findings. IMPRESSION: 1. Extensive acute bilateral pulmonary emboli as detailed above including a saddle pulmonary embolus. There is CT evidence of right heart strain. Positive for acute PE with CT evidence of right heart strain (RV/LV Ratio = 1.3) consistent with at least submassive (intermediate risk) PE. The presence of right heart strain has been associated with an increased risk of morbidity and mortality. 2. Airspace opacity in the right middle lobe may represent infiltrate or developing pulmonary infarct in the setting of acute pulmonary emboli. 3. Trace right-sided pleural effusion. 4. Cardiomegaly. 5. Hepatic steatosis. 6. Cholelithiasis. Electronically Signed: By: Constance Holster M.D. On: 05/30/2019 19:27   US Venous Img Lower Bilateral  Result Date: 05/30/2019 CLINICAL DATA:  Bilateral pulmonary emboli  EXAM: BILATERAL LOWER EXTREMITY VENOUS DOPPLER ULTRASOUND TECHNIQUE: Gray-scale sonography with graded compression, as well as color Doppler and duplex ultrasound were performed to evaluate the lower extremity deep venous systems from the level of the common femoral vein and including the common femoral, femoral, profunda femoral, popliteal and calf veins including the posterior tibial, peroneal and gastrocnemius veins when visible.  The superficial great saphenous vein was also interrogated. Spectral Doppler was utilized to evaluate flow at rest and with distal augmentation maneuvers in the common femoral, femoral and popliteal veins. COMPARISON:  None. FINDINGS: RIGHT LOWER EXTREMITY Common Femoral Vein: No evidence of thrombus. Normal compressibility, respiratory phasicity and response to augmentation. Saphenofemoral Junction: No evidence of thrombus. Normal compressibility and flow on color Doppler imaging. Profunda Femoral Vein: No evidence of thrombus. Normal compressibility and flow on color Doppler imaging. Femoral Vein: Occlusive thrombus noted in the distal femoral vein. Distal vessel is noncompressible. Popliteal Vein: Occlusive thrombus noted in the right popliteal vein. Calf Veins: Occlusive thrombus noted within the posterior tibial veins and 1 peroneal vein. Superficial Great Saphenous Vein: No evidence of thrombus. Normal compressibility. Venous Reflux:  None. Other Findings:  None. LEFT LOWER EXTREMITY Common Femoral Vein: No evidence of thrombus. Normal compressibility, respiratory phasicity and response to augmentation. Saphenofemoral Junction: No evidence of thrombus. Normal compressibility and flow on color Doppler imaging. Profunda Femoral Vein: No evidence of thrombus. Normal compressibility and flow on color Doppler imaging. Femoral Vein: No evidence of thrombus. Normal compressibility, respiratory phasicity and response to augmentation. Popliteal Vein: No evidence of thrombus. Normal  compressibility, respiratory phasicity and response to augmentation. Calf Veins: No evidence of thrombus. Normal compressibility and flow on color Doppler imaging. Superficial Great Saphenous Vein: No evidence of thrombus. Normal compressibility. Venous Reflux:  None. Other Findings:  None. IMPRESSION: Occlusive thrombus within the right distal femoral vein, popliteal vein and calf veins. Electronically Signed   By: Rolm Baptise M.D.   On: 05/30/2019 23:55     CODE STATUS:     Code Status Orders  (From admission, onward)         Start     Ordered   05/30/19 2239  Full code  Continuous     05/30/19 2238        Code Status History    Date Active Date Inactive Code Status Order ID Comments User Context   05/30/2019 1120 05/30/2019 2238 DNR [id number]  Hillary Bow, MD Inpatient   05/29/2019 2316 05/30/2019 1120 Full Code [id number]  Mayer Camel, NP ED   Advance Care Planning Activity    Advance Directive Documentation     Most Recent Value  Type of Advance Directive  Living will  Pre-existing out of facility DNR order (yellow form or pink MOST form)  -  "MOST" Form in Place?  -      TOTAL TIME TAKING CARE OF THIS PATIENT: *40* minutes.    Fritzi Mandes M.D on 06/01/2019 at 10:59 AM  Between 7am to 6pm - Pager - [phone] After 6pm go to [email or web address] - password [email or web address] [facility]  Office  [phone]  CC: Primary care physician; Dion Body, MD

## 2019-06-01 NOTE — Progress Notes (Signed)
Writer spoke with pt's wife at length regarding her concerns about the pending discharge.  Josh, Case Management, has tried to arrange additional healthcare support, but their insurance does not cover BID nursing visits for ADLs.    Pt's pharmacy is closed at the time of his discharge and will not reopen until Monday.  Printer prescription obtained for pt's wife to take to another pharmacy.  Discharge instructions, prescription, medication discount card and coupon placed in envelop for pt and wife.

## 2019-06-03 ENCOUNTER — Telehealth: Payer: Self-pay | Admitting: Cardiovascular Disease

## 2019-06-03 ENCOUNTER — Encounter: Payer: Self-pay | Admitting: Vascular Surgery

## 2019-06-03 LAB — CULTURE, BLOOD (ROUTINE X 2)
Culture: NO GROWTH
Culture: NO GROWTH

## 2019-06-03 NOTE — Telephone Encounter (Signed)
Spoke with patient's wife briefly. Patient is sleeping at this time. Wife states patient has cognitive issues and forgets things easily. She is wanting to be able to come down to appointment with patient next week. Advised I would route to provider for review and advice.   Saw Ignacia Bayley, NP in the hospital and appointment is with Thurmond Butts.

## 2019-06-03 NOTE — Telephone Encounter (Signed)
Sounds like it would be appropriate for her to come to the appointment as well. [place] with me.

## 2019-06-03 NOTE — Telephone Encounter (Signed)
Patient has upcoming appt and wife wants to be added to visitor list for [facility] due to cognitive issues

## 2019-06-03 NOTE — Telephone Encounter (Signed)
Appointment note updated.

## 2019-06-04 DIAGNOSIS — M545 Low back pain: Secondary | ICD-10-CM | POA: Diagnosis not present

## 2019-06-04 DIAGNOSIS — N183 Chronic kidney disease, stage 3 (moderate): Secondary | ICD-10-CM | POA: Diagnosis not present

## 2019-06-04 DIAGNOSIS — E1122 Type 2 diabetes mellitus with diabetic chronic kidney disease: Secondary | ICD-10-CM | POA: Diagnosis not present

## 2019-06-04 DIAGNOSIS — R413 Other amnesia: Secondary | ICD-10-CM | POA: Diagnosis not present

## 2019-06-04 DIAGNOSIS — I82411 Acute embolism and thrombosis of right femoral vein: Secondary | ICD-10-CM | POA: Diagnosis not present

## 2019-06-04 DIAGNOSIS — E669 Obesity, unspecified: Secondary | ICD-10-CM | POA: Diagnosis not present

## 2019-06-04 DIAGNOSIS — G629 Polyneuropathy, unspecified: Secondary | ICD-10-CM | POA: Diagnosis not present

## 2019-06-04 DIAGNOSIS — I2692 Saddle embolus of pulmonary artery without acute cor pulmonale: Secondary | ICD-10-CM | POA: Diagnosis not present

## 2019-06-04 DIAGNOSIS — I129 Hypertensive chronic kidney disease with stage 1 through stage 4 chronic kidney disease, or unspecified chronic kidney disease: Secondary | ICD-10-CM | POA: Diagnosis not present

## 2019-06-04 DIAGNOSIS — N400 Enlarged prostate without lower urinary tract symptoms: Secondary | ICD-10-CM | POA: Diagnosis not present

## 2019-06-04 DIAGNOSIS — Z7901 Long term (current) use of anticoagulants: Secondary | ICD-10-CM | POA: Diagnosis not present

## 2019-06-04 DIAGNOSIS — R270 Ataxia, unspecified: Secondary | ICD-10-CM | POA: Diagnosis not present

## 2019-06-05 ENCOUNTER — Other Ambulatory Visit: Payer: Self-pay | Admitting: *Deleted

## 2019-06-05 NOTE — Patient Outreach (Signed)
[facility]) Care Management [facility] CM Case Closure note 06/05/2019  Brendan Nguyen 04-03-1941 [id number]  [facility] Care Management Case Closure note from previously placed referral from IP RN CM after recent hospitalization June 17-20, 2020 for acute respiratory failure with hypoxia, ACS with pulmonary embolism.  Received confirmation from [facility] team that patient is now active with an external program  Plan:  Will close patient case and make patient inactive with [facility] CM, as patient is currently followed by external program  Oneta Rack, RN, BSN, CCRN [facility] - Inpatient Care Management  [phone]

## 2019-06-06 ENCOUNTER — Telehealth: Payer: Self-pay | Admitting: *Deleted

## 2019-06-06 NOTE — Telephone Encounter (Signed)
I spoke with pt's wife concerning pt's appointment and screening. Pt has upcoming appointment 06/11/19 @ 2:30 pm. Pt's wife mentioned that she would need to come to pt's appointment due to pt having cognitive issues and she normally assist pt to appointment. Please advise if ok for visitor.

## 2019-06-06 NOTE — Telephone Encounter (Signed)

## 2019-06-07 ENCOUNTER — Other Ambulatory Visit: Payer: Self-pay | Admitting: *Deleted

## 2019-06-07 DIAGNOSIS — N183 Chronic kidney disease, stage 3 (moderate): Secondary | ICD-10-CM | POA: Diagnosis not present

## 2019-06-07 DIAGNOSIS — I2699 Other pulmonary embolism without acute cor pulmonale: Secondary | ICD-10-CM | POA: Diagnosis not present

## 2019-06-07 DIAGNOSIS — I10 Essential (primary) hypertension: Secondary | ICD-10-CM | POA: Diagnosis not present

## 2019-06-07 NOTE — Progress Notes (Signed)
Cardiology Office Note Date:  06/11/2019  Patient ID:  Brendan Nguyen, Brendan Nguyen November 11, 1941, MRN [id number] PCP:  Brendan Body, MD  Cardiologist:  Dr. Rockey Situ, MD    Chief Complaint: Hospital follow up  History of Present Illness: Brendan Nguyen is a 78 y.o. male with history of recently diagnosed right lower extremity DVT, saddle PE with right heart strain, TIA, CKD stage III, DM2, HTN, HLD, BPH, GERD, and back pain with significant limitation in mobility and leg weaknesswho presents for hospital follow up after recent admission to [facility] from 6/17 to 6/20 for acute saddle PE with right heart strain as well as right lower extremity DVT.   He was previously followed by [facility] in 2017 secondary to dizziness/presyncope with echo at that time showing normal LVEF, mild LVH, mild AI, trivial MR/TR.  His symptoms were thought to be related to orthostasis with subsequent discontinuation of ARB.  He did not require cardiology follow up afterwards.   The patient has chronic lower back pain with limited mobility.  However, he was able to stay active and maintain strength with regular gym workouts.  In the setting of the COVID-19 pandemic his gym closed and he noted significant progression of the leg weakness with trembling with associated difficulty ambulating/transferring.  He was admitted to [facility] in mid June with a 2 to 3-week history of intermittent cough that was becoming more frequent as well as worsening shortness of breath.  He was noted to have an elevated troponin peaking at 0.08.  Echo on 05/30/2019 showed an EF of 55 to 60%, moderately reduced RV systolic function with moderately enlarged RV cavity.  In this setting he underwent CTA of the chest which showed extensive acute bilateral pulmonary emboli including a saddle embolus with evidence of right heart strain.  Lower extremity ultrasound showed an occlusive thrombus within the right distal femoral vein, popliteal vein, and calf veins.  He  was consulted on by vascular surgery and underwent pulmonary thrombectomy on 05/31/2019 followed by discharge on DVT/PE dosed Eliquis.  Labs: 05/2019 - potassium 4.2, serum creatinine 1.46, WBC 8.2, Hgb 12.9, PLT 223, TG 211, LDL 55, A1c 5.7, TSH normal, magnesium 2.2, AST/ALT normal  He comes in accompanied by his wife today and is doing well from a cardiac perspective.  No chest pain, shortness of breath, dizziness, presyncope, or syncope.  Stable lower extremity swelling, no abdominal distention, orthopnea, PND, early satiety.  No falls, BRBPR, or melena.  He is tolerating Eliquis without issues.  He does not have follow-up with hematology for discussion of length of anticoagulation therapy or if he would need ongoing low-dose anticoagulation in the setting of a sedentary lifestyle.  Past Medical History:  Diagnosis Date   BPH (benign prostatic hyperplasia)    CKD (chronic kidney disease), stage III (HCC)    Elevated blood uric acid level    GERD (gastroesophageal reflux disease)    History of echocardiogram    a.02/2016 Echo: EF 50-55%. Mild LVH. Mild AI. Triv MR/TR.   Hyperparathyroidism ([place])    Hypertension    Hypertriglyceridemia    Mixed hyperlipidemia    TIA (transient ischemic attack) 2013    Past Surgical History:  Procedure Laterality Date   ACHILLES TENDON REPAIR     BLEPHAROPLASTY     L5 lamenectomy     had surgery but not the complete surgery   l5-s1 laninectomy     left inguinal hernia     PULMONARY THROMBECTOMY N/A 05/31/2019  Procedure: PULMONARY THROMBECTOMY;  Surgeon: Katha Cabal, MD;  Location: [place] [facility];  Service: Cardiovascular;  Laterality: N/A;    Current Meds  Medication Sig   apixaban (ELIQUIS) 5 MG TABS tablet Take 2 tablets (10 mg total) by mouth 2 (two) times daily. Then take 5 mg (1 tab) bid  Starting 06/08/2019 (Patient taking differently: Take 5 mg by mouth 2 (two) times daily. )   carbidopa-levodopa (SINEMET IR)  25-100 MG tablet TAKE 3 TABLETS BY MOUTH 3 TIMES DAILY   fenofibrate 160 MG tablet Take 160 mg by mouth daily.    oxybutynin (DITROPAN-XL) 5 MG 24 hr tablet Take 1 tablet (5 mg total) by mouth daily.   VITAMIN D PO Take 400 Units by mouth daily.    Allergies:   Patient has no known allergies.   Social History:  The patient  reports that he has never smoked. He has never used smokeless tobacco. He reports that he does not drink alcohol or use drugs.   Family History:  The patient's family history is not on file.  ROS:   Review of Systems  Constitutional: Positive for malaise/fatigue. Negative for chills, diaphoresis, fever and weight loss.  HENT: Negative for congestion.   Eyes: Negative for discharge and redness.  Respiratory: Negative for cough, hemoptysis, sputum production, shortness of breath and wheezing.   Cardiovascular: Negative for chest pain, palpitations, orthopnea, claudication, leg swelling and PND.  Gastrointestinal: Negative for abdominal pain, blood in stool, heartburn, melena, nausea and vomiting.  Genitourinary: Negative for hematuria.  Musculoskeletal: Negative for falls and myalgias.  Skin: Negative for rash.  Neurological: Positive for weakness. Negative for dizziness, tingling, tremors, sensory change, speech change, focal weakness and loss of consciousness.  Endo/Heme/Allergies: Does not bruise/bleed easily.  Psychiatric/Behavioral: Negative for substance abuse. The patient is not nervous/anxious.   All other systems reviewed and are negative.    PHYSICAL EXAM:  VS:  BP 126/70 (BP Location: Left Arm, Patient Position: Sitting, Cuff Size: Normal)    Pulse (!) 55    Ht 5\' 8"  (1.727 m)    Wt 197 lb 4 oz (89.5 kg)    BMI 29.99 kg/m  BMI: Nguyen mass index is 29.99 kg/m.  Physical Exam  Constitutional: He is oriented to person, place, and time. He appears well-developed and well-nourished.  HENT:  Head: Normocephalic and atraumatic.  Eyes: Right eye exhibits  no discharge. Left eye exhibits no discharge.  Neck: Normal range of motion. No JVD present.  Cardiovascular: Regular rhythm, S1 normal, S2 normal and normal heart sounds. Bradycardia present. Exam reveals no distant heart sounds, no friction rub, no midsystolic click and no opening snap.  No murmur heard. Pulses:      Posterior tibial pulses are 2+ on the right side and 2+ on the left side.  Pulmonary/Chest: Effort normal and breath sounds normal. No respiratory distress. He has no decreased breath sounds. He has no wheezes. He has no rales. He exhibits no tenderness.  Abdominal: Soft. He exhibits no distension. There is no abdominal tenderness.  Musculoskeletal:        General: Edema present.     Comments: Trace bilateral pretibial edema.  Compression stockings noted.  Neurological: He is alert and oriented to person, place, and time.  Skin: Skin is warm and dry. No cyanosis. Nails show no clubbing.  Psychiatric: He has a normal mood and affect. His speech is normal and behavior is normal. Judgment and thought content normal.  EKG:  Was ordered and interpreted by me today. Shows sinus bradycardia, 55 bpm, inferior T wave inversion (improved from prior)  Recent Labs: 05/29/2019: ALT 10; B Natriuretic Peptide 716.0 05/30/2019: Magnesium 2.2; TSH 1.717 06/01/2019: BUN 25; Creatinine, Ser 1.46; Hemoglobin 12.9; Platelets 223; Potassium 4.2; Sodium 139  05/30/2019: Cholesterol 120; HDL 23; LDL Cholesterol 55; Total CHOL/HDL Ratio 5.2; Triglycerides 211; VLDL 42   Estimated Creatinine Clearance: 44.6 mL/min (A) (by C-G formula based on SCr of 1.46 mg/dL (H)).   Wt Readings from Last 3 Encounters:  06/11/19 197 lb 4 oz (89.5 kg)  05/30/19 192 lb 3.2 oz (87.2 kg)  05/24/18 204 lb 8 oz (92.8 kg)     Other studies reviewed: Additional studies/records reviewed today include: summarized above  ASSESSMENT AND PLAN:  1. Bilateral/saddle PE/lower extremity DVT: Status post thrombolysis with  mechanical thrombectomy per vascular surgery.  Remains on DVT/PE dosed anticoagulation per vascular surgery/PCP.  Refer to hematology for potential hypercoagulable work-up and for input regarding duration of anticoagulation/potential ongoing low-dose anticoagulation in the setting of relatively sedentary lifestyle.  Recommend he discontinue compression stockings until DVT is resolved.  2. Elevated troponin:His elevated troponin was minimal and not consistent with ACS.  No plans for ischemic evaluation at this time, especially in the setting of preserved LV systolic function and lack of anginal symptoms.  On Eliquis in place of aspirin.  Aggressive risk factor modification and primary prevention.  3. Hypertriglyceridemia: Discussion with patient and wife regarding transition from fenofibrate to Kountze.  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  He prefers to remain on current therapy at this time.  Followed by PCP.  4. CKD stage III: Followed by nephrology.  Disposition: F/u with Dr. Rockey Situ PRN.  Current medicines are reviewed at length with the patient today.  The patient did not have any concerns regarding medicines.  Signed, Christell Faith, PA-C 06/11/2019 2:47 PM     [address] [place], [place] [address] [phone]

## 2019-06-07 NOTE — Telephone Encounter (Signed)
Spoke with wife and let her know that it was ok for her to come for the appointment with patient.

## 2019-06-07 NOTE — Patient Outreach (Addendum)
[facility]) Care Management  06/07/2019  Brendan Nguyen 1941/07/03 [id number]   Case discussed with [facility] RNCM at [facility] on 06/05/2019.   Case discussed via secure email with Brendan Nguyen at [facility] on 06/05/2019, and on 06/07/2019.  Patient is active with [facility] for case management services and case closed due to enrolled in external CM program. RNCM will send MD case closure letter.     Brendan Nguyen, BSN, [facility] Telephonic CM Phone: [phone] Fax: [phone]

## 2019-06-11 ENCOUNTER — Ambulatory Visit (INDEPENDENT_AMBULATORY_CARE_PROVIDER_SITE_OTHER): Payer: PPO | Admitting: Physician Assistant

## 2019-06-11 ENCOUNTER — Other Ambulatory Visit: Payer: Self-pay

## 2019-06-11 ENCOUNTER — Encounter: Payer: Self-pay | Admitting: Physician Assistant

## 2019-06-11 VITALS — BP 126/70 | HR 55 | Ht 68.0 in | Wt 197.2 lb

## 2019-06-11 DIAGNOSIS — R7989 Other specified abnormal findings of blood chemistry: Secondary | ICD-10-CM | POA: Diagnosis not present

## 2019-06-11 DIAGNOSIS — I2692 Saddle embolus of pulmonary artery without acute cor pulmonale: Secondary | ICD-10-CM | POA: Diagnosis not present

## 2019-06-11 DIAGNOSIS — N183 Chronic kidney disease, stage 3 unspecified: Secondary | ICD-10-CM

## 2019-06-11 DIAGNOSIS — E781 Pure hyperglyceridemia: Secondary | ICD-10-CM | POA: Diagnosis not present

## 2019-06-11 DIAGNOSIS — R778 Other specified abnormalities of plasma proteins: Secondary | ICD-10-CM

## 2019-06-11 NOTE — Patient Instructions (Addendum)
Medication Instructions:  Your physician recommends that you continue on your current medications as directed. Please refer to the Current Medication list given to you today.  If you need a refill on your cardiac medications before your next appointment, please call your pharmacy.   Lab work: None ordered  If you have labs (blood work) drawn today and your tests are completely normal, you will receive your results only by: Marland Kitchen MyChart Message (if you have MyChart) OR . A paper copy in the mail If you have any lab test that is abnormal or we need to change your treatment, we will call you to review the results.  Testing/Procedures: None ordered   Follow-Up: At [facility], you and your health needs are our priority.  As part of our continuing mission to provide you with exceptional heart care, we have created designated Provider Care Teams.  These Care Teams include your primary Cardiologist (physician) and Advanced Practice Providers (APPs -  Physician Assistants and Nurse Practitioners) who all work together to provide you with the care you need, when you need it. You will need a follow up appointment as needed.   You may see Ida Rogue, MD or Christell Faith, PA-C.    Please refer to hematology for further questions.  [phone]

## 2019-06-17 ENCOUNTER — Other Ambulatory Visit: Payer: Self-pay

## 2019-06-17 ENCOUNTER — Ambulatory Visit (INDEPENDENT_AMBULATORY_CARE_PROVIDER_SITE_OTHER): Payer: PPO | Admitting: Vascular Surgery

## 2019-06-17 ENCOUNTER — Encounter (INDEPENDENT_AMBULATORY_CARE_PROVIDER_SITE_OTHER): Payer: Self-pay | Admitting: Vascular Surgery

## 2019-06-17 VITALS — BP 121/66 | HR 85 | Resp 10 | Ht 68.0 in | Wt 195.0 lb

## 2019-06-17 DIAGNOSIS — I10 Essential (primary) hypertension: Secondary | ICD-10-CM | POA: Diagnosis not present

## 2019-06-17 DIAGNOSIS — I2692 Saddle embolus of pulmonary artery without acute cor pulmonale: Secondary | ICD-10-CM

## 2019-06-17 DIAGNOSIS — Z7901 Long term (current) use of anticoagulants: Secondary | ICD-10-CM | POA: Diagnosis not present

## 2019-06-17 DIAGNOSIS — Z9889 Other specified postprocedural states: Secondary | ICD-10-CM

## 2019-06-17 DIAGNOSIS — Z79899 Other long term (current) drug therapy: Secondary | ICD-10-CM

## 2019-06-17 DIAGNOSIS — E782 Mixed hyperlipidemia: Secondary | ICD-10-CM | POA: Diagnosis not present

## 2019-06-17 DIAGNOSIS — I249 Acute ischemic heart disease, unspecified: Secondary | ICD-10-CM | POA: Diagnosis not present

## 2019-06-20 DIAGNOSIS — N183 Chronic kidney disease, stage 3 (moderate): Secondary | ICD-10-CM | POA: Diagnosis not present

## 2019-06-20 DIAGNOSIS — N2581 Secondary hyperparathyroidism of renal origin: Secondary | ICD-10-CM | POA: Diagnosis not present

## 2019-06-20 DIAGNOSIS — I10 Essential (primary) hypertension: Secondary | ICD-10-CM | POA: Diagnosis not present

## 2019-06-20 DIAGNOSIS — R600 Localized edema: Secondary | ICD-10-CM | POA: Diagnosis not present

## 2019-06-24 DIAGNOSIS — G629 Polyneuropathy, unspecified: Secondary | ICD-10-CM | POA: Diagnosis not present

## 2019-06-24 DIAGNOSIS — E1122 Type 2 diabetes mellitus with diabetic chronic kidney disease: Secondary | ICD-10-CM | POA: Diagnosis not present

## 2019-06-24 DIAGNOSIS — N400 Enlarged prostate without lower urinary tract symptoms: Secondary | ICD-10-CM | POA: Diagnosis not present

## 2019-06-24 DIAGNOSIS — I2692 Saddle embolus of pulmonary artery without acute cor pulmonale: Secondary | ICD-10-CM | POA: Diagnosis not present

## 2019-06-24 DIAGNOSIS — I129 Hypertensive chronic kidney disease with stage 1 through stage 4 chronic kidney disease, or unspecified chronic kidney disease: Secondary | ICD-10-CM | POA: Diagnosis not present

## 2019-06-24 DIAGNOSIS — R270 Ataxia, unspecified: Secondary | ICD-10-CM | POA: Diagnosis not present

## 2019-06-24 DIAGNOSIS — I82411 Acute embolism and thrombosis of right femoral vein: Secondary | ICD-10-CM | POA: Diagnosis not present

## 2019-06-24 DIAGNOSIS — N183 Chronic kidney disease, stage 3 (moderate): Secondary | ICD-10-CM | POA: Diagnosis not present

## 2019-06-25 ENCOUNTER — Other Ambulatory Visit: Payer: Self-pay

## 2019-06-25 ENCOUNTER — Inpatient Hospital Stay: Payer: PPO | Attending: Oncology

## 2019-06-25 ENCOUNTER — Encounter: Payer: Self-pay | Admitting: Oncology

## 2019-06-25 ENCOUNTER — Inpatient Hospital Stay: Payer: PPO | Attending: Oncology | Admitting: Oncology

## 2019-06-25 VITALS — BP 112/74 | HR 80 | Temp 97.1°F | Wt 201.1 lb

## 2019-06-25 DIAGNOSIS — D6862 Lupus anticoagulant syndrome: Secondary | ICD-10-CM | POA: Diagnosis not present

## 2019-06-25 DIAGNOSIS — R918 Other nonspecific abnormal finding of lung field: Secondary | ICD-10-CM | POA: Diagnosis not present

## 2019-06-25 DIAGNOSIS — Z8673 Personal history of transient ischemic attack (TIA), and cerebral infarction without residual deficits: Secondary | ICD-10-CM | POA: Diagnosis not present

## 2019-06-25 DIAGNOSIS — I2692 Saddle embolus of pulmonary artery without acute cor pulmonale: Secondary | ICD-10-CM

## 2019-06-25 DIAGNOSIS — I2602 Saddle embolus of pulmonary artery with acute cor pulmonale: Secondary | ICD-10-CM

## 2019-06-25 DIAGNOSIS — Z79899 Other long term (current) drug therapy: Secondary | ICD-10-CM | POA: Diagnosis not present

## 2019-06-25 DIAGNOSIS — M545 Low back pain: Secondary | ICD-10-CM

## 2019-06-25 DIAGNOSIS — Z7901 Long term (current) use of anticoagulants: Secondary | ICD-10-CM

## 2019-06-25 DIAGNOSIS — I129 Hypertensive chronic kidney disease with stage 1 through stage 4 chronic kidney disease, or unspecified chronic kidney disease: Secondary | ICD-10-CM | POA: Insufficient documentation

## 2019-06-25 DIAGNOSIS — R269 Unspecified abnormalities of gait and mobility: Secondary | ICD-10-CM | POA: Insufficient documentation

## 2019-06-25 DIAGNOSIS — N183 Chronic kidney disease, stage 3 (moderate): Secondary | ICD-10-CM | POA: Diagnosis not present

## 2019-06-25 DIAGNOSIS — K8020 Calculus of gallbladder without cholecystitis without obstruction: Secondary | ICD-10-CM | POA: Insufficient documentation

## 2019-06-25 DIAGNOSIS — N400 Enlarged prostate without lower urinary tract symptoms: Secondary | ICD-10-CM | POA: Diagnosis not present

## 2019-06-25 DIAGNOSIS — E782 Mixed hyperlipidemia: Secondary | ICD-10-CM

## 2019-06-25 DIAGNOSIS — I517 Cardiomegaly: Secondary | ICD-10-CM | POA: Insufficient documentation

## 2019-06-25 DIAGNOSIS — K760 Fatty (change of) liver, not elsewhere classified: Secondary | ICD-10-CM | POA: Diagnosis not present

## 2019-06-25 DIAGNOSIS — R0602 Shortness of breath: Secondary | ICD-10-CM | POA: Insufficient documentation

## 2019-06-27 ENCOUNTER — Telehealth: Payer: Self-pay | Admitting: *Deleted

## 2019-06-27 ENCOUNTER — Encounter: Payer: Self-pay | Admitting: Oncology

## 2019-06-27 LAB — PTT-LA MIX: PTT-LA Mix: 53.7 s — ABNORMAL HIGH (ref 0.0–48.9)

## 2019-06-27 LAB — LUPUS ANTICOAGULANT
DRVVT: 55.3 s — ABNORMAL HIGH (ref 0.0–47.0)
PTT Lupus Anticoagulant: 54.2 s — ABNORMAL HIGH (ref 0.0–51.9)
Thrombin Time: 19.8 s (ref 0.0–23.0)
dPT Confirm Ratio: 0.8 ratio (ref 0.00–1.40)
dPT: 44.3 s (ref 0.0–55.0)

## 2019-06-27 LAB — HEX PHASE PHOSPHOLIPID REFLEX

## 2019-06-27 LAB — DRVVT MIX: dRVVT Mix: 44.1 s (ref 0.0–47.0)

## 2019-06-27 LAB — BETA-2-GLYCOPROTEIN I ABS, IGG/M/A
Beta-2 Glyco I IgG: <9 GPI IgG units (ref 0–20)
Beta-2-Glycoprotein I IgA: <9 GPI IgA units (ref 0–25)
Beta-2-Glycoprotein I IgM: <9 GPI IgM units (ref 0–32)

## 2019-06-27 LAB — CARDIOLIPIN ANTIBODIES, IGM+IGG
Anticardiolipin IgG: <9 GPL U/mL (ref 0–14)
Anticardiolipin IgM: 9 MPL U/mL (ref 0–12)

## 2019-06-27 LAB — HEXAGONAL PHASE PHOSPHOLIPID
Hex Phosph Neut Test: 0 s (ref 0–11)
Hexagonal Phase Phospholipid: 0 s (ref 0–11)

## 2019-06-27 NOTE — Telephone Encounter (Signed)
Called patient's home and wife answered the phone.  The patient right now is asleep taking a nap.  I told the wife that the lab results on the antiphospholipid antibody syndrome was negative and because of that test result he does not need to return to our clinic.  The wife asked about refills for his Eliquis.  I asked her where she got the prescription from and it was given by Dr. Posey Pronto the hospitalist while he was in the hospital for his blood clot.  I told the wife that they usually good go to their primary care which for them is Dr. Netty Starring.  I told the wife that if she would wait until she is a week before running out of medicine for the patient and then call Dr. Raylene Miyamoto office and ask for refill.  The wife states that the nephrologist was also concerned about his kidney function while he is on the Eliquis.  I told her that she should start with Dr. Netty Starring and if Dr. Netty Starring had an issue then she could call nephrology and ask them about it but typically meds that patients will take for a lifetime is usually refilled and monitored through primary care.  She was appreciative that I helped her in the situation and if she has any further questions she is welcome to call

## 2019-06-27 NOTE — Telephone Encounter (Signed)
-----   Message from Sindy Guadeloupe, MD sent at 06/27/2019  8:13 AM EDT ----- Please let him know that his tests are negative for antiphospholipid antibody syndrome. Thanks, Astrid Divine

## 2019-06-27 NOTE — Progress Notes (Signed)
Hematology/Oncology Consult note [facility] [phone] [phone]  Patient Care Team: Dion Body, MD as PCP - General (Family Medicine) Minna Merritts, MD as PCP - Cardiology (Cardiology) Anthonette Legato, MD (Internal Medicine)   Name of the patient: Brendan Nguyen  [id number]  10-27-1941    Reason for referral-history of acute saddle pulmonary embolism   Referring physician-Dunn Thurmond Butts, [place]  Date of visit: 06/27/19   History of presenting illness- Patient is a 78 year old male with a past medical history significant for hypertension, hyperlipidemia, stage III CKD among other medical problems.  He was admitted to the hospital in June 2020 with symptoms of acute onset shortness of breath and underwent CT a which showed extensive acute bilateral PE including a saddle pulmonary embolus.  Evidence of right heart strain.  Occlusive thrombus was noted in the right distal femoral vein popliteal and calf veins.  Patient was also seen by vascular surgery and underwent a thrombectomy.  He was discharged on Eliquis.  He was subsequently seen by cardiology and has been referred to [place] for possible hypercoagulable work-up.  Patient admits to being sedentary for most part of the day.  He is a lifelong non-smoker.  No family history of DVT or PE.  No personal past history of thrombosis.  No other long distance travel or surgeries preceding his PE.  Currently patient is tolerating his Eliquis well without any significant side effects  ECOG PS- 2  Pain scale- 0   Review of systems- Review of Systems  Constitutional: Negative for chills, fever, malaise/fatigue and weight loss.  HENT: Negative for congestion, ear discharge and nosebleeds.   Eyes: Negative for blurred vision.  Respiratory: Negative for cough, hemoptysis, sputum production, shortness of breath and wheezing.   Cardiovascular: Negative for chest pain, palpitations, orthopnea and claudication.   Gastrointestinal: Negative for abdominal pain, blood in stool, constipation, diarrhea, heartburn, melena, nausea and vomiting.  Genitourinary: Negative for dysuria, flank pain, frequency, hematuria and urgency.  Musculoskeletal: Negative for back pain, joint pain and myalgias.  Skin: Negative for rash.  Neurological: Negative for dizziness, tingling, focal weakness, seizures, weakness and headaches.  Endo/Heme/Allergies: Does not bruise/bleed easily.  Psychiatric/Behavioral: Negative for depression and suicidal ideas. The patient does not have insomnia.     No Known Allergies  Patient Active Problem List   Diagnosis Date Noted   Acute pulmonary embolism ([place]) 05/31/2019   ACS (acute coronary syndrome) ([place]) 05/29/2019   DNR (do not resuscitate) 07/26/2018   BPH (benign prostatic hyperplasia) 04/25/2018   Gait abnormality 01/18/2018   Cerebellar ataxia in diseases classified elsewhere ([place]) 01/18/2018   Rupture of Achilles tendon 08/16/2017   Gout 08/11/2017   Peroneal tendinitis 08/07/2017   Abnormal gait 06/21/2017   Mixed hyperlipidemia 03/30/2017   Stage 3 chronic kidney disease ([place]) 03/30/2017   Polyneuropathy 03/29/2017   Dizzy spells 03/29/2017   Vitamin D deficiency 12/30/2016   Obesity (BMI 30-39.9) 09/12/2016   Essential hypertriglyceridemia 08/25/2016   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 Mild LVH. Mild AI. Triv MR/TR.   Hyperparathyroidism ([place])    Hypertension    Hypertriglyceridemia    Mixed hyperlipidemia  TIA (transient ischemic attack) 2013     Past Surgical History:  Procedure Laterality Date   ACHILLES TENDON REPAIR      BLEPHAROPLASTY     L5 lamenectomy     had surgery but not the complete surgery   l5-s1 laninectomy     left inguinal hernia     PULMONARY THROMBECTOMY N/A 05/31/2019   Procedure: PULMONARY THROMBECTOMY;  Surgeon: Katha Cabal, MD;  Location: [facility];  Service: Cardiovascular;  Laterality: N/A;    Social History   Socioeconomic History   Marital status: Married    Spouse name: Not on file   Number of children: Not on file   Years of education: Not on file   Highest education level: Not on file  Occupational History   Not on file  Social Needs   Financial resource strain: Not on file   Food insecurity    Worry: Not on file    Inability: Not on file   Transportation needs    Medical: Not on file    Non-medical: Not on file  Tobacco Use   Smoking status: Never Smoker   Smokeless tobacco: Never Used  Substance and Sexual Activity   Alcohol use: No    Alcohol/week: 0.0 standard drinks   Drug use: No   Sexual activity: Not on file  Lifestyle   Physical activity    Days per week: Not on file    Minutes per session: Not on file   Stress: Not on file  Relationships   Social connections    Talks on phone: Not on file    Gets together: Not on file    Attends religious service: Not on file    Active member of club or organization: Not on file    Attends meetings of clubs or organizations: Not on file    Relationship status: Not on file   Intimate partner violence    Fear of current or ex partner: Not on file    Emotionally abused: Not on file    Physically abused: Not on file    Forced sexual activity: Not on file  Other Topics Concern   Not on file  Social History Narrative   Retired Restaurant manager, fast food.  Lives locally w/ wife.  Was exercising regularly prior to Covid-19 pandemic, but his gym has been closed since.     History reviewed. No pertinent family history.   Current Outpatient Medications:    apixaban (ELIQUIS) 5 MG TABS  tablet, Take 2 tablets (10 mg total) by mouth 2 (two) times daily. Then take 5 mg (1 tab) bid  Starting 06/08/2019 (Patient taking differently: Take 5 mg by mouth 2 (two) times daily. ), Disp: 74 tablet, Rfl: 1   carbidopa-levodopa (SINEMET IR) 25-100 MG tablet, TAKE 3 TABLETS BY MOUTH 3 TIMES DAILY, Disp: 270 tablet, Rfl: 11   fenofibrate 160 MG tablet, Take 160 mg by mouth daily. , Disp: , Rfl:    oxybutynin (DITROPAN-XL) 5 MG 24 hr tablet, Take 1 tablet (5 mg total) by mouth daily., Disp: 30 tablet, Rfl: 11   VITAMIN D PO, Take 400 Units by mouth daily., Disp: , Rfl:    Physical exam:  Vitals:   06/25/19 1358  BP: 112/74  Pulse: 80  Temp: (!) 97.1 F (36.2 C)  TempSrc: Tympanic  Weight: 201 lb 1.6 oz (91.2 kg)   Physical Exam Constitutional:      General: He is not in acute distress.    Comments:  He is sitting in a wheelchair  HENT:     Head: Normocephalic and atraumatic.  Eyes:     Pupils: Pupils are equal, round, and reactive to light.  Neck:     Musculoskeletal: Normal range of motion.  Cardiovascular:     Rate and Rhythm: Normal rate and regular rhythm.     Heart sounds: Normal heart sounds.  Pulmonary:     Effort: Pulmonary effort is normal.     Breath sounds: Normal breath sounds.  Abdominal:     General: Bowel sounds are normal.     Palpations: Abdomen is soft.  Skin:    General: Skin is warm and dry.  Neurological:     Mental Status: He is alert and oriented to person, place, and time.        CMP Latest Ref Rng & Units 06/01/2019  Glucose 70 - 99 mg/dL 95  BUN 8 - 23 mg/dL 25(H)  Creatinine 0.61 - 1.24 mg/dL 1.46(H)  Sodium 135 - 145 mmol/L 139  Potassium 3.5 - 5.1 mmol/L 4.2  Chloride 98 - 111 mmol/L 110  CO2 22 - 32 mmol/L 22  Calcium 8.9 - 10.3 mg/dL 8.6(L)  Total Protein 6.5 - 8.1 g/dL -  Total Bilirubin 0.3 - 1.2 mg/dL -  Alkaline Phos 38 - 126 U/L -  AST 15 - 41 U/L -  ALT 0 - 44 U/L -   CBC Latest Ref Rng & Units 06/01/2019  WBC 4.0 -  10.5 K/uL 8.2  Hemoglobin 13.0 - 17.0 g/dL 12.9(L)  Hematocrit 39.0 - 52.0 % 39.4  Platelets 150 - 400 K/uL 223    No images are attached to the encounter.  Ct Angio Chest Pe W Or Wo Contrast  Addendum Date: 05/30/2019   ADDENDUM REPORT: 05/30/2019 19:49 ADDENDUM: These results were called by telephone at the time of interpretation on 05/30/2019 at 7:48 pm to RN Valere Dross, who verbally acknowledged these results. Multiple attempts were made by the reading physician and PRA to contact the ordering physician. These attempts were unsuccessful. RN endorsed understanding that this is a critical finding and large pulmonary embolus requiring immediate action. Electronically Signed   By: Constance Holster M.D.   On: 05/30/2019 19:49   Result Date: 05/30/2019 CLINICAL DATA:  Acute respiratory failure. EXAM: CT ANGIOGRAPHY CHEST WITH CONTRAST TECHNIQUE: Multidetector CT imaging of the chest was performed using the standard protocol during bolus administration of intravenous contrast. Multiplanar CT image reconstructions and MIPs were obtained to evaluate the vascular anatomy. CONTRAST:  62mL OMNIPAQUE IOHEXOL 350 MG/ML SOLN COMPARISON:  None. FINDINGS: Cardiovascular: There is a large saddle pulmonary embolus with extensive pulmonary emboli involving the lobar, segmental, and subsegmental branches of virtually all lobes. There is right heart strain with an RV/LV ratio measuring approximately 1.3. The heart size is enlarged. There is reflux of contrast in the IVC consistent with right heart strain/failure Mediastinum/Nodes: No enlarged mediastinal, hilar, or axillary lymph nodes. Thyroid gland, trachea, and esophagus demonstrate no significant findings. Lungs/Pleura: There is atelectasis at the right lung base. There is a trace right-sided pleural effusion. There is an airspace opacity in the right middle lobe. There is no pneumothorax. The trachea is unremarkable. Upper Abdomen: There is cholelithiasis without CT  evidence of acute cholecystitis involving the partially visualized gallbladder. There appears to be underlying hepatic steatosis. Musculoskeletal: No chest wall abnormality. No acute or significant osseous findings. Review of the MIP images confirms the above findings. IMPRESSION: 1. Extensive acute bilateral pulmonary emboli as  detailed above including a saddle pulmonary embolus. There is CT evidence of right heart strain. Positive for acute PE with CT evidence of right heart strain (RV/LV Ratio = 1.3) consistent with at least submassive (intermediate risk) PE. The presence of right heart strain has been associated with an increased risk of morbidity and mortality. 2. Airspace opacity in the right middle lobe may represent infiltrate or developing pulmonary infarct in the setting of acute pulmonary emboli. 3. Trace right-sided pleural effusion. 4. Cardiomegaly. 5. Hepatic steatosis. 6. Cholelithiasis. Electronically Signed: By: Constance Holster M.D. On: 05/30/2019 19:27   US Venous Img Lower Bilateral  Result Date: 05/30/2019 CLINICAL DATA:  Bilateral pulmonary emboli EXAM: BILATERAL LOWER EXTREMITY VENOUS DOPPLER ULTRASOUND TECHNIQUE: Gray-scale sonography with graded compression, as well as color Doppler and duplex ultrasound were performed to evaluate the lower extremity deep venous systems from the level of the common femoral vein and including the common femoral, femoral, profunda femoral, popliteal and calf veins including the posterior tibial, peroneal and gastrocnemius veins when visible. The superficial great saphenous vein was also interrogated. Spectral Doppler was utilized to evaluate flow at rest and with distal augmentation maneuvers in the common femoral, femoral and popliteal veins. COMPARISON:  None. FINDINGS: RIGHT LOWER EXTREMITY Common Femoral Vein: No evidence of thrombus. Normal compressibility, respiratory phasicity and response to augmentation. Saphenofemoral Junction: No evidence of  thrombus. Normal compressibility and flow on color Doppler imaging. Profunda Femoral Vein: No evidence of thrombus. Normal compressibility and flow on color Doppler imaging. Femoral Vein: Occlusive thrombus noted in the distal femoral vein. Distal vessel is noncompressible. Popliteal Vein: Occlusive thrombus noted in the right popliteal vein. Calf Veins: Occlusive thrombus noted within the posterior tibial veins and 1 peroneal vein. Superficial Great Saphenous Vein: No evidence of thrombus. Normal compressibility. Venous Reflux:  None. Other Findings:  None. LEFT LOWER EXTREMITY Common Femoral Vein: No evidence of thrombus. Normal compressibility, respiratory phasicity and response to augmentation. Saphenofemoral Junction: No evidence of thrombus. Normal compressibility and flow on color Doppler imaging. Profunda Femoral Vein: No evidence of thrombus. Normal compressibility and flow on color Doppler imaging. Femoral Vein: No evidence of thrombus. Normal compressibility, respiratory phasicity and response to augmentation. Popliteal Vein: No evidence of thrombus. Normal compressibility, respiratory phasicity and response to augmentation. Calf Veins: No evidence of thrombus. Normal compressibility and flow on color Doppler imaging. Superficial Great Saphenous Vein: No evidence of thrombus. Normal compressibility. Venous Reflux:  None. Other Findings:  None. IMPRESSION: Occlusive thrombus within the right distal femoral vein, popliteal vein and calf veins. Electronically Signed   By: Rolm Baptise M.D.   On: 05/30/2019 23:55   Dg Chest Portable 1 View  Result Date: 05/29/2019 CLINICAL DATA:  Low back pain.  Shortness of breath. EXAM: PORTABLE CHEST 1 VIEW COMPARISON:  None. FINDINGS: Mild opacity in the lateral right lung base. The heart, hila, mediastinum, lungs, and pleura are otherwise normal. IMPRESSION: Mild opacity in the lateral right lung base may represent infiltrate or atelectasis. Recommend short-term  follow-up to ensure resolution. Electronically Signed   By: Dorise Bullion III M.D   On: 05/29/2019 16:45    Assessment and plan- Patient is a 78 y.o. male referred for acute saddle  pulmonary embolism  Patient had an unprovoked acute saddle PE probably secondary to baseline state of immobilization and relative inactivity.  He is currently on Eliquis and I would recommend that he should continue to remain on Eliquis indefinitely.  He would need periodic monitoring of his kidney function  since this can be done by cardiology or his primary care doctor.  As long as his serum creatinine is less than 2.5 or GFR is greater than 35 he is okay to continue on Eliquis.  If his kidney apparently does fall below that he will need to be switched to Coumadin.  Hypercoagulable work-up would not change management for him acutely.  Regardless of the results of hypercoagulable work-up patient will need to remain on lifelong anticoagulation.  Discussed that we could do genetic testing as well as testing for antiphospholipid antibody syndrome.  Patient does not have any biological children of his own and genetic testing will not change management for him although he could pass on this data to his siblings.  His living siblings are in their 45s and have not had any thrombosis so far.  Given the patient has not had any personal history of thrombosis for all this while, likelihood of him having a genetic condition such as factor V Leiden and prothrombin gene mutation is low.  He is however agreeable to getting checked for antiphospholipid antibody syndrome and I will therefore order anticardiolipin antibody, beta-2 glycoprotein, lupus anticoagulant and hex phase test today.  If the results of these tests are positive we will need to switch him to Coumadin as there is data for Coumadin to be better than other oral anticoagulants particularly in this condition.  We will call the patient with the results of his blood work.  He does  not require hematology follow-up at this time and continue to remain on Eliquis indefinitely.  I have updated his wife over the phone today as well.   Thank you for this kind referral and the opportunity to participate in the care of this patient   Visit Diagnosis 1. Acute saddle pulmonary embolism with acute cor pulmonale (HCC)     Dr. Randa Evens, MD, MPH [facility] [phone] 06/27/2019 8:46 AM

## 2019-06-30 ENCOUNTER — Encounter (INDEPENDENT_AMBULATORY_CARE_PROVIDER_SITE_OTHER): Payer: Self-pay | Admitting: Vascular Surgery

## 2019-06-30 NOTE — Progress Notes (Signed)
MRN : [id number]  Brendan Nguyen is a 78 y.o. (1941-02-29) male who presents with chief complaint of  Chief Complaint  Patient presents with  . Follow-up  .  History of Present Illness:  Patient returns to the office s/p:  Procedure(s) Performed:             1.  Contrast injection right heart             2.  Thrombolysis with 20 mg total bilateral main pulmonary arteries             3.  Mechanical thrombectomy bilateral main pulmonary arteries             4.  Selective catheter placement right main pulmonary artery             5.  Selective catheter placement left main pulmonary artery  Procedure was performed on 05/31/2019.  He states his breathing is markedly improved no hemoptysis no SOB no chest pain.  Oncology is following his anticoagulation   Current Meds  Medication Sig  . apixaban (ELIQUIS) 5 MG TABS tablet Take 2 tablets (10 mg total) by mouth 2 (two) times daily. Then take 5 mg (1 tab) bid  Starting 06/08/2019 (Patient taking differently: Take 5 mg by mouth 2 (two) times daily. )  . carbidopa-levodopa (SINEMET IR) 25-100 MG tablet TAKE 3 TABLETS BY MOUTH 3 TIMES DAILY  . fenofibrate 160 MG tablet Take 160 mg by mouth daily.   Marland Kitchen oxybutynin (DITROPAN-XL) 5 MG 24 hr tablet Take 1 tablet (5 mg total) by mouth daily.  Marland Kitchen VITAMIN D PO Take 400 Units by mouth daily.    Past Medical History:  Diagnosis Date  . BPH (benign prostatic hyperplasia)   . CKD (chronic kidney disease), stage III ([place])   . Elevated blood uric acid level   . GERD (gastroesophageal reflux disease)   . History of echocardiogram    a.02/2016 Echo: EF 50-55%. Mild LVH. Mild AI. Triv MR/TR.  Marland Kitchen Hyperparathyroidism ([place])   . Hypertension   . Hypertriglyceridemia   . Mixed hyperlipidemia   . TIA (transient ischemic attack) 2013    Past Surgical History:  Procedure Laterality Date  . ACHILLES TENDON REPAIR    . BLEPHAROPLASTY    . L5 lamenectomy     had surgery but not the complete surgery  .  l5-s1 laninectomy    . left inguinal hernia    . PULMONARY THROMBECTOMY N/A 05/31/2019   Procedure: PULMONARY THROMBECTOMY;  Surgeon: Katha Cabal, MD;  Location: [facility];  Service: Cardiovascular;  Laterality: N/A;    Social History Social History   Tobacco Use  . Smoking status: Never Smoker  . Smokeless tobacco: Never Used  Substance Use Topics  . Alcohol use: No    Alcohol/week: 0.0 standard drinks  . Drug use: No    Family History No family history on file.  No Known Allergies   REVIEW OF SYSTEMS (Negative unless checked)  Constitutional: [] Weight loss  [] Fever  [] Chills Cardiac: [] Chest pain   [] Chest pressure   [] Palpitations   [] Shortness of breath when laying flat   [] Shortness of breath with exertion. 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 Genitourinary:  [x] Chronic kidney disease   [] Difficult urination  [] Frequent urination   [] Blood in urine Skin:  [] Rashes   [] Ulcers  Psychological:  [] History of anxiety   []  History of major depression.  Physical Examination  Vitals:   06/17/19 1434  BP: 121/66  Pulse: 85  Resp: 10  Weight: 195 lb (88.5 kg)  Height: 5\' 8"  (1.727 m)   Body mass index is 29.65 kg/m. Gen: WD/WN, NAD Head: [place]/AT, No temporalis wasting.  Ear/Nose/Throat: Hearing grossly intact,  nares w/o erythema or drainage Eyes: PER, EOMI, sclera nonicteric.  Neck: Supple, no large masses.   Pulmonary:  Good air movement, no audible wheezing bilaterally, no use of accessory muscles.  Cardiac: RRR, no JVD Vascular:  Vessel Right Left  Radial Palpable Palpable  Gastrointestinal: Non-distended. No guarding/no peritoneal signs.  Musculoskeletal: M/S 5/5 throughout.  No deformity or atrophy.  Neurologic: CN 2-12 intact. Symmetrical.  Speech is fluent. Motor exam as listed above. Psychiatric: Judgment intact, Mood & affect appropriate for pt's clinical situation. Dermatologic: No rashes or ulcers noted.  No changes consistent with cellulitis. Lymph : No lichenification or skin changes of chronic lymphedema.  CBC Lab Results  Component Value Date   WBC 8.2 06/01/2019   HGB 12.9 (L) 06/01/2019   HCT 39.4 06/01/2019   MCV 96.6 06/01/2019   PLT 223 06/01/2019    BMET    Component Value Date/Time   NA 139 06/01/2019 0752   NA 139 11/20/2015 0815   NA 134 (L) 05/02/2012 0128   K 4.2 06/01/2019 0752   K 5.3 (H) 05/02/2012 0128   CL 110 06/01/2019 0752   CL 102 05/02/2012 0128   CO2 22 06/01/2019 0752   CO2 24 05/02/2012 0128   GLUCOSE 95 06/01/2019 0752   GLUCOSE 192 (H) 05/02/2012 0128   BUN 25 (H) 06/01/2019 0752   BUN 30 (H) 11/20/2015 0815   BUN 43 (H) 05/02/2012 0128   CREATININE 1.46 (H) 06/01/2019 0752   CREATININE 1.72 (H) 05/02/2012 0128   CALCIUM 8.6 (L) 06/01/2019 0752   CALCIUM 9.5 05/02/2012 0128   GFRNONAA 45 (L) 06/01/2019 0752   GFRNONAA 39 (L) 05/02/2012 0128   GFRAA 52 (L) 06/01/2019 0752   GFRAA 45 (L) 05/02/2012 0128   CrCl cannot be calculated (Patient's most recent lab result is older than the maximum 21 days allowed.).  COAG Lab Results  Component Value Date   INR 1.2 05/29/2019    Radiology No results found.   Assessment/Plan 1. Acute saddle pulmonary embolism without acute cor pulmonale (HCC)  The patient presents to the office  for follow up of pulmonary lysis for PE.  PE was identified at [facility] by CT angiogram.    No surgery or intervention at this point in time.  IVC filter is not indicated at present.  The patient is initiated on anticoagulation   Elevation was stressed, use of a recliner was discussed.  I have had a long discussion with the patient regarding DVT/PE and post phlebitic changes  such as swelling and why it  causes symptoms such as pain.  The patient will wear graduated compression stockings class 1 (20-30 mmHg), beginning after three full days of anticoagulation, on a daily basis a prescription was given. The patient will  beginning wearing the stockings first thing in the morning and removing them in the evening. The patient is instructed specifically not to sleep in the stockings.  In addition, behavioral modification including elevation during the day and avoidance of prolonged dependency will be initiated.    The patient will continue anticoagulation for now as there have not been any problems or complications at this point.   He will follow up PRN  2. Essential hypertension Continue antihypertensive medications as already ordered, these medications have been reviewed and there are no changes at this time.   3. ACS (acute coronary syndrome) (HCC) Continue cardiac and antihypertensive medications as already ordered and reviewed, no changes at this time.  Continue statin as ordered and reviewed, no changes at this time  Nitrates PRN for chest pain   4. Mixed hyperlipidemia Continue statin as ordered and reviewed, no changes at this time     Hortencia Pilar, MD  06/30/2019 4:32 PM

## 2019-07-04 DIAGNOSIS — E1122 Type 2 diabetes mellitus with diabetic chronic kidney disease: Secondary | ICD-10-CM | POA: Diagnosis not present

## 2019-07-04 DIAGNOSIS — I129 Hypertensive chronic kidney disease with stage 1 through stage 4 chronic kidney disease, or unspecified chronic kidney disease: Secondary | ICD-10-CM | POA: Diagnosis not present

## 2019-07-04 DIAGNOSIS — N183 Chronic kidney disease, stage 3 (moderate): Secondary | ICD-10-CM | POA: Diagnosis not present

## 2019-07-04 DIAGNOSIS — I82411 Acute embolism and thrombosis of right femoral vein: Secondary | ICD-10-CM | POA: Diagnosis not present

## 2019-07-04 DIAGNOSIS — I2692 Saddle embolus of pulmonary artery without acute cor pulmonale: Secondary | ICD-10-CM | POA: Diagnosis not present

## 2019-07-04 DIAGNOSIS — G629 Polyneuropathy, unspecified: Secondary | ICD-10-CM | POA: Diagnosis not present

## 2019-07-04 DIAGNOSIS — Z7901 Long term (current) use of anticoagulants: Secondary | ICD-10-CM | POA: Diagnosis not present

## 2019-07-04 DIAGNOSIS — R270 Ataxia, unspecified: Secondary | ICD-10-CM | POA: Diagnosis not present

## 2019-07-04 DIAGNOSIS — E669 Obesity, unspecified: Secondary | ICD-10-CM | POA: Diagnosis not present

## 2019-07-04 DIAGNOSIS — M545 Low back pain: Secondary | ICD-10-CM | POA: Diagnosis not present

## 2019-07-04 DIAGNOSIS — N400 Enlarged prostate without lower urinary tract symptoms: Secondary | ICD-10-CM | POA: Diagnosis not present

## 2019-07-04 DIAGNOSIS — R413 Other amnesia: Secondary | ICD-10-CM | POA: Diagnosis not present

## 2019-07-29 ENCOUNTER — Telehealth: Payer: Self-pay | Admitting: Neurology

## 2019-07-29 NOTE — Telephone Encounter (Signed)
The patient needs his last office note, from 11/26/2018, sent to Dr. Dion Body.  They would like to provide authorization for the release of records over the phone or through [place], if possible.  His wife is on [place], Joren Rehm.  Her best contact number is [phone].

## 2019-07-29 NOTE — Telephone Encounter (Signed)
Patient wife called stating that they are trying to get a new walker that has a laser on it and in order to meet the criteria the notes need to say that he has parkinsonism syndrome.

## 2019-07-29 NOTE — Addendum Note (Signed)
Addended by: Noberto Retort C on: 07/29/2019 10:46 AM   Modules accepted: Orders

## 2019-08-03 DIAGNOSIS — G629 Polyneuropathy, unspecified: Secondary | ICD-10-CM | POA: Diagnosis not present

## 2019-08-03 DIAGNOSIS — N183 Chronic kidney disease, stage 3 (moderate): Secondary | ICD-10-CM | POA: Diagnosis not present

## 2019-08-03 DIAGNOSIS — I82411 Acute embolism and thrombosis of right femoral vein: Secondary | ICD-10-CM | POA: Diagnosis not present

## 2019-08-03 DIAGNOSIS — I129 Hypertensive chronic kidney disease with stage 1 through stage 4 chronic kidney disease, or unspecified chronic kidney disease: Secondary | ICD-10-CM | POA: Diagnosis not present

## 2019-08-03 DIAGNOSIS — R270 Ataxia, unspecified: Secondary | ICD-10-CM | POA: Diagnosis not present

## 2019-08-03 DIAGNOSIS — N400 Enlarged prostate without lower urinary tract symptoms: Secondary | ICD-10-CM | POA: Diagnosis not present

## 2019-08-03 DIAGNOSIS — G20 Parkinson's disease: Secondary | ICD-10-CM | POA: Diagnosis not present

## 2019-08-03 DIAGNOSIS — Z7901 Long term (current) use of anticoagulants: Secondary | ICD-10-CM | POA: Diagnosis not present

## 2019-08-03 DIAGNOSIS — E669 Obesity, unspecified: Secondary | ICD-10-CM | POA: Diagnosis not present

## 2019-08-03 DIAGNOSIS — M545 Low back pain: Secondary | ICD-10-CM | POA: Diagnosis not present

## 2019-08-03 DIAGNOSIS — E1122 Type 2 diabetes mellitus with diabetic chronic kidney disease: Secondary | ICD-10-CM | POA: Diagnosis not present

## 2019-08-03 DIAGNOSIS — I2692 Saddle embolus of pulmonary artery without acute cor pulmonale: Secondary | ICD-10-CM | POA: Diagnosis not present

## 2019-08-03 DIAGNOSIS — R413 Other amnesia: Secondary | ICD-10-CM | POA: Diagnosis not present

## 2019-08-12 ENCOUNTER — Other Ambulatory Visit (INDEPENDENT_AMBULATORY_CARE_PROVIDER_SITE_OTHER): Payer: Self-pay | Admitting: Nurse Practitioner

## 2019-08-12 ENCOUNTER — Telehealth (INDEPENDENT_AMBULATORY_CARE_PROVIDER_SITE_OTHER): Payer: Self-pay | Admitting: Vascular Surgery

## 2019-08-12 NOTE — Telephone Encounter (Signed)
I spoke with Eulogio Ditch NP and she advise that from a vascular prospective the patient can be release to go back to the gym for exercise. The patient ask was he at any risk with COVID-19 since he taking Elqiuis and I advise him that he is not at any higher risk just use necessary precautions.

## 2019-08-22 DIAGNOSIS — R270 Ataxia, unspecified: Secondary | ICD-10-CM | POA: Diagnosis not present

## 2019-08-22 DIAGNOSIS — G20 Parkinson's disease: Secondary | ICD-10-CM | POA: Diagnosis not present

## 2019-08-22 DIAGNOSIS — N183 Chronic kidney disease, stage 3 (moderate): Secondary | ICD-10-CM | POA: Diagnosis not present

## 2019-08-22 DIAGNOSIS — I82411 Acute embolism and thrombosis of right femoral vein: Secondary | ICD-10-CM | POA: Diagnosis not present

## 2019-08-22 DIAGNOSIS — N400 Enlarged prostate without lower urinary tract symptoms: Secondary | ICD-10-CM | POA: Diagnosis not present

## 2019-08-22 DIAGNOSIS — I2692 Saddle embolus of pulmonary artery without acute cor pulmonale: Secondary | ICD-10-CM | POA: Diagnosis not present

## 2019-08-22 DIAGNOSIS — I129 Hypertensive chronic kidney disease with stage 1 through stage 4 chronic kidney disease, or unspecified chronic kidney disease: Secondary | ICD-10-CM | POA: Diagnosis not present

## 2019-08-22 DIAGNOSIS — E1122 Type 2 diabetes mellitus with diabetic chronic kidney disease: Secondary | ICD-10-CM | POA: Diagnosis not present

## 2019-09-02 DIAGNOSIS — Z7901 Long term (current) use of anticoagulants: Secondary | ICD-10-CM | POA: Diagnosis not present

## 2019-09-02 DIAGNOSIS — G20 Parkinson's disease: Secondary | ICD-10-CM | POA: Diagnosis not present

## 2019-09-02 DIAGNOSIS — M545 Low back pain: Secondary | ICD-10-CM | POA: Diagnosis not present

## 2019-09-02 DIAGNOSIS — I82411 Acute embolism and thrombosis of right femoral vein: Secondary | ICD-10-CM | POA: Diagnosis not present

## 2019-09-02 DIAGNOSIS — G629 Polyneuropathy, unspecified: Secondary | ICD-10-CM | POA: Diagnosis not present

## 2019-09-02 DIAGNOSIS — E1122 Type 2 diabetes mellitus with diabetic chronic kidney disease: Secondary | ICD-10-CM | POA: Diagnosis not present

## 2019-09-02 DIAGNOSIS — N400 Enlarged prostate without lower urinary tract symptoms: Secondary | ICD-10-CM | POA: Diagnosis not present

## 2019-09-02 DIAGNOSIS — N183 Chronic kidney disease, stage 3 (moderate): Secondary | ICD-10-CM | POA: Diagnosis not present

## 2019-09-02 DIAGNOSIS — R413 Other amnesia: Secondary | ICD-10-CM | POA: Diagnosis not present

## 2019-09-02 DIAGNOSIS — I129 Hypertensive chronic kidney disease with stage 1 through stage 4 chronic kidney disease, or unspecified chronic kidney disease: Secondary | ICD-10-CM | POA: Diagnosis not present

## 2019-09-02 DIAGNOSIS — E669 Obesity, unspecified: Secondary | ICD-10-CM | POA: Diagnosis not present

## 2019-09-02 DIAGNOSIS — R270 Ataxia, unspecified: Secondary | ICD-10-CM | POA: Diagnosis not present

## 2019-09-02 DIAGNOSIS — I2692 Saddle embolus of pulmonary artery without acute cor pulmonale: Secondary | ICD-10-CM | POA: Diagnosis not present

## 2019-09-09 DIAGNOSIS — D400 Neoplasm of uncertain behavior of prostate: Secondary | ICD-10-CM | POA: Diagnosis not present

## 2019-09-09 DIAGNOSIS — N401 Enlarged prostate with lower urinary tract symptoms: Secondary | ICD-10-CM | POA: Diagnosis not present

## 2019-09-09 DIAGNOSIS — Z8551 Personal history of malignant neoplasm of bladder: Secondary | ICD-10-CM | POA: Diagnosis not present

## 2019-09-09 DIAGNOSIS — E291 Testicular hypofunction: Secondary | ICD-10-CM | POA: Diagnosis not present

## 2019-09-09 DIAGNOSIS — N3281 Overactive bladder: Secondary | ICD-10-CM | POA: Diagnosis not present

## 2019-10-07 DIAGNOSIS — R600 Localized edema: Secondary | ICD-10-CM | POA: Diagnosis not present

## 2019-10-07 DIAGNOSIS — N1832 Chronic kidney disease, stage 3b: Secondary | ICD-10-CM | POA: Diagnosis not present

## 2019-10-07 DIAGNOSIS — N2581 Secondary hyperparathyroidism of renal origin: Secondary | ICD-10-CM | POA: Diagnosis not present

## 2019-10-07 DIAGNOSIS — I10 Essential (primary) hypertension: Secondary | ICD-10-CM | POA: Diagnosis not present

## 2019-11-13 DIAGNOSIS — E781 Pure hyperglyceridemia: Secondary | ICD-10-CM | POA: Diagnosis not present

## 2019-11-13 DIAGNOSIS — N183 Chronic kidney disease, stage 3 unspecified: Secondary | ICD-10-CM | POA: Diagnosis not present

## 2019-11-19 DIAGNOSIS — E669 Obesity, unspecified: Secondary | ICD-10-CM | POA: Diagnosis not present

## 2019-11-19 DIAGNOSIS — M25472 Effusion, left ankle: Secondary | ICD-10-CM | POA: Diagnosis not present

## 2019-11-19 DIAGNOSIS — Z0000 Encounter for general adult medical examination without abnormal findings: Secondary | ICD-10-CM | POA: Diagnosis not present

## 2019-11-19 DIAGNOSIS — E781 Pure hyperglyceridemia: Secondary | ICD-10-CM | POA: Diagnosis not present

## 2020-01-15 DIAGNOSIS — G119 Hereditary ataxia, unspecified: Secondary | ICD-10-CM | POA: Diagnosis not present

## 2020-01-24 DIAGNOSIS — I739 Peripheral vascular disease, unspecified: Secondary | ICD-10-CM | POA: Diagnosis not present

## 2020-01-24 DIAGNOSIS — I129 Hypertensive chronic kidney disease with stage 1 through stage 4 chronic kidney disease, or unspecified chronic kidney disease: Secondary | ICD-10-CM | POA: Diagnosis not present

## 2020-01-24 DIAGNOSIS — K219 Gastro-esophageal reflux disease without esophagitis: Secondary | ICD-10-CM | POA: Diagnosis not present

## 2020-01-24 DIAGNOSIS — E781 Pure hyperglyceridemia: Secondary | ICD-10-CM | POA: Diagnosis not present

## 2020-01-24 DIAGNOSIS — N400 Enlarged prostate without lower urinary tract symptoms: Secondary | ICD-10-CM | POA: Diagnosis not present

## 2020-01-24 DIAGNOSIS — Z7901 Long term (current) use of anticoagulants: Secondary | ICD-10-CM | POA: Diagnosis not present

## 2020-01-24 DIAGNOSIS — G119 Hereditary ataxia, unspecified: Secondary | ICD-10-CM | POA: Diagnosis not present

## 2020-01-24 DIAGNOSIS — E559 Vitamin D deficiency, unspecified: Secondary | ICD-10-CM | POA: Diagnosis not present

## 2020-01-24 DIAGNOSIS — N183 Chronic kidney disease, stage 3 unspecified: Secondary | ICD-10-CM | POA: Diagnosis not present

## 2020-01-24 DIAGNOSIS — Z9181 History of falling: Secondary | ICD-10-CM | POA: Diagnosis not present

## 2020-01-28 DIAGNOSIS — I129 Hypertensive chronic kidney disease with stage 1 through stage 4 chronic kidney disease, or unspecified chronic kidney disease: Secondary | ICD-10-CM | POA: Diagnosis not present

## 2020-01-28 DIAGNOSIS — Z7901 Long term (current) use of anticoagulants: Secondary | ICD-10-CM | POA: Diagnosis not present

## 2020-01-28 DIAGNOSIS — G119 Hereditary ataxia, unspecified: Secondary | ICD-10-CM | POA: Diagnosis not present

## 2020-01-28 DIAGNOSIS — I739 Peripheral vascular disease, unspecified: Secondary | ICD-10-CM | POA: Diagnosis not present

## 2020-01-28 DIAGNOSIS — E559 Vitamin D deficiency, unspecified: Secondary | ICD-10-CM | POA: Diagnosis not present

## 2020-01-28 DIAGNOSIS — K219 Gastro-esophageal reflux disease without esophagitis: Secondary | ICD-10-CM | POA: Diagnosis not present

## 2020-01-28 DIAGNOSIS — Z9181 History of falling: Secondary | ICD-10-CM | POA: Diagnosis not present

## 2020-01-28 DIAGNOSIS — E781 Pure hyperglyceridemia: Secondary | ICD-10-CM | POA: Diagnosis not present

## 2020-01-28 DIAGNOSIS — N183 Chronic kidney disease, stage 3 unspecified: Secondary | ICD-10-CM | POA: Diagnosis not present

## 2020-01-28 DIAGNOSIS — N400 Enlarged prostate without lower urinary tract symptoms: Secondary | ICD-10-CM | POA: Diagnosis not present

## 2020-01-29 DIAGNOSIS — N183 Chronic kidney disease, stage 3 unspecified: Secondary | ICD-10-CM | POA: Diagnosis not present

## 2020-01-29 DIAGNOSIS — E559 Vitamin D deficiency, unspecified: Secondary | ICD-10-CM | POA: Diagnosis not present

## 2020-01-29 DIAGNOSIS — I129 Hypertensive chronic kidney disease with stage 1 through stage 4 chronic kidney disease, or unspecified chronic kidney disease: Secondary | ICD-10-CM | POA: Diagnosis not present

## 2020-01-29 DIAGNOSIS — Z7901 Long term (current) use of anticoagulants: Secondary | ICD-10-CM | POA: Diagnosis not present

## 2020-01-29 DIAGNOSIS — N400 Enlarged prostate without lower urinary tract symptoms: Secondary | ICD-10-CM | POA: Diagnosis not present

## 2020-01-29 DIAGNOSIS — E781 Pure hyperglyceridemia: Secondary | ICD-10-CM | POA: Diagnosis not present

## 2020-01-29 DIAGNOSIS — G119 Hereditary ataxia, unspecified: Secondary | ICD-10-CM | POA: Diagnosis not present

## 2020-01-29 DIAGNOSIS — I739 Peripheral vascular disease, unspecified: Secondary | ICD-10-CM | POA: Diagnosis not present

## 2020-01-29 DIAGNOSIS — K219 Gastro-esophageal reflux disease without esophagitis: Secondary | ICD-10-CM | POA: Diagnosis not present

## 2020-01-29 DIAGNOSIS — Z9181 History of falling: Secondary | ICD-10-CM | POA: Diagnosis not present

## 2020-01-31 DIAGNOSIS — I129 Hypertensive chronic kidney disease with stage 1 through stage 4 chronic kidney disease, or unspecified chronic kidney disease: Secondary | ICD-10-CM | POA: Diagnosis not present

## 2020-01-31 DIAGNOSIS — E559 Vitamin D deficiency, unspecified: Secondary | ICD-10-CM | POA: Diagnosis not present

## 2020-01-31 DIAGNOSIS — E781 Pure hyperglyceridemia: Secondary | ICD-10-CM | POA: Diagnosis not present

## 2020-01-31 DIAGNOSIS — G119 Hereditary ataxia, unspecified: Secondary | ICD-10-CM | POA: Diagnosis not present

## 2020-01-31 DIAGNOSIS — N400 Enlarged prostate without lower urinary tract symptoms: Secondary | ICD-10-CM | POA: Diagnosis not present

## 2020-01-31 DIAGNOSIS — N183 Chronic kidney disease, stage 3 unspecified: Secondary | ICD-10-CM | POA: Diagnosis not present

## 2020-01-31 DIAGNOSIS — I739 Peripheral vascular disease, unspecified: Secondary | ICD-10-CM | POA: Diagnosis not present

## 2020-02-04 DIAGNOSIS — Z9181 History of falling: Secondary | ICD-10-CM | POA: Diagnosis not present

## 2020-02-04 DIAGNOSIS — I129 Hypertensive chronic kidney disease with stage 1 through stage 4 chronic kidney disease, or unspecified chronic kidney disease: Secondary | ICD-10-CM | POA: Diagnosis not present

## 2020-02-04 DIAGNOSIS — K219 Gastro-esophageal reflux disease without esophagitis: Secondary | ICD-10-CM | POA: Diagnosis not present

## 2020-02-04 DIAGNOSIS — E781 Pure hyperglyceridemia: Secondary | ICD-10-CM | POA: Diagnosis not present

## 2020-02-04 DIAGNOSIS — E559 Vitamin D deficiency, unspecified: Secondary | ICD-10-CM | POA: Diagnosis not present

## 2020-02-04 DIAGNOSIS — Z7901 Long term (current) use of anticoagulants: Secondary | ICD-10-CM | POA: Diagnosis not present

## 2020-02-04 DIAGNOSIS — N400 Enlarged prostate without lower urinary tract symptoms: Secondary | ICD-10-CM | POA: Diagnosis not present

## 2020-02-04 DIAGNOSIS — I739 Peripheral vascular disease, unspecified: Secondary | ICD-10-CM | POA: Diagnosis not present

## 2020-02-04 DIAGNOSIS — N183 Chronic kidney disease, stage 3 unspecified: Secondary | ICD-10-CM | POA: Diagnosis not present

## 2020-02-04 DIAGNOSIS — G119 Hereditary ataxia, unspecified: Secondary | ICD-10-CM | POA: Diagnosis not present

## 2020-02-12 DIAGNOSIS — G119 Hereditary ataxia, unspecified: Secondary | ICD-10-CM | POA: Diagnosis not present

## 2020-02-12 DIAGNOSIS — K219 Gastro-esophageal reflux disease without esophagitis: Secondary | ICD-10-CM | POA: Diagnosis not present

## 2020-02-12 DIAGNOSIS — Z9181 History of falling: Secondary | ICD-10-CM | POA: Diagnosis not present

## 2020-02-12 DIAGNOSIS — N183 Chronic kidney disease, stage 3 unspecified: Secondary | ICD-10-CM | POA: Diagnosis not present

## 2020-02-12 DIAGNOSIS — I129 Hypertensive chronic kidney disease with stage 1 through stage 4 chronic kidney disease, or unspecified chronic kidney disease: Secondary | ICD-10-CM | POA: Diagnosis not present

## 2020-02-12 DIAGNOSIS — N400 Enlarged prostate without lower urinary tract symptoms: Secondary | ICD-10-CM | POA: Diagnosis not present

## 2020-02-12 DIAGNOSIS — I739 Peripheral vascular disease, unspecified: Secondary | ICD-10-CM | POA: Diagnosis not present

## 2020-02-12 DIAGNOSIS — E781 Pure hyperglyceridemia: Secondary | ICD-10-CM | POA: Diagnosis not present

## 2020-02-12 DIAGNOSIS — Z7901 Long term (current) use of anticoagulants: Secondary | ICD-10-CM | POA: Diagnosis not present

## 2020-02-12 DIAGNOSIS — E559 Vitamin D deficiency, unspecified: Secondary | ICD-10-CM | POA: Diagnosis not present

## 2020-02-13 DIAGNOSIS — N183 Chronic kidney disease, stage 3 unspecified: Secondary | ICD-10-CM | POA: Diagnosis not present

## 2020-02-13 DIAGNOSIS — N400 Enlarged prostate without lower urinary tract symptoms: Secondary | ICD-10-CM | POA: Diagnosis not present

## 2020-02-13 DIAGNOSIS — K219 Gastro-esophageal reflux disease without esophagitis: Secondary | ICD-10-CM | POA: Diagnosis not present

## 2020-02-13 DIAGNOSIS — Z9181 History of falling: Secondary | ICD-10-CM | POA: Diagnosis not present

## 2020-02-13 DIAGNOSIS — E781 Pure hyperglyceridemia: Secondary | ICD-10-CM | POA: Diagnosis not present

## 2020-02-13 DIAGNOSIS — I129 Hypertensive chronic kidney disease with stage 1 through stage 4 chronic kidney disease, or unspecified chronic kidney disease: Secondary | ICD-10-CM | POA: Diagnosis not present

## 2020-02-13 DIAGNOSIS — I739 Peripheral vascular disease, unspecified: Secondary | ICD-10-CM | POA: Diagnosis not present

## 2020-02-13 DIAGNOSIS — Z7901 Long term (current) use of anticoagulants: Secondary | ICD-10-CM | POA: Diagnosis not present

## 2020-02-13 DIAGNOSIS — E559 Vitamin D deficiency, unspecified: Secondary | ICD-10-CM | POA: Diagnosis not present

## 2020-02-13 DIAGNOSIS — G119 Hereditary ataxia, unspecified: Secondary | ICD-10-CM | POA: Diagnosis not present

## 2020-02-29 ENCOUNTER — Emergency Department: Payer: PPO

## 2020-02-29 ENCOUNTER — Other Ambulatory Visit: Payer: Self-pay

## 2020-02-29 ENCOUNTER — Emergency Department
Admission: EM | Admit: 2020-02-29 | Discharge: 2020-02-29 | Disposition: A | Discharge status: Home / Self Care | Payer: PPO | Attending: Emergency Medicine | Admitting: Emergency Medicine

## 2020-02-29 ENCOUNTER — Encounter: Payer: Self-pay | Admitting: Emergency Medicine

## 2020-02-29 DIAGNOSIS — N183 Chronic kidney disease, stage 3 unspecified: Secondary | ICD-10-CM | POA: Insufficient documentation

## 2020-02-29 DIAGNOSIS — R531 Weakness: Secondary | ICD-10-CM | POA: Diagnosis not present

## 2020-02-29 DIAGNOSIS — N400 Enlarged prostate without lower urinary tract symptoms: Secondary | ICD-10-CM | POA: Insufficient documentation

## 2020-02-29 DIAGNOSIS — K611 Rectal abscess: Secondary | ICD-10-CM | POA: Diagnosis not present

## 2020-02-29 DIAGNOSIS — R195 Other fecal abnormalities: Secondary | ICD-10-CM | POA: Diagnosis present

## 2020-02-29 DIAGNOSIS — R58 Hemorrhage, not elsewhere classified: Secondary | ICD-10-CM | POA: Diagnosis not present

## 2020-02-29 DIAGNOSIS — Z7901 Long term (current) use of anticoagulants: Secondary | ICD-10-CM | POA: Diagnosis not present

## 2020-02-29 DIAGNOSIS — Z86711 Personal history of pulmonary embolism: Secondary | ICD-10-CM | POA: Insufficient documentation

## 2020-02-29 DIAGNOSIS — K648 Other hemorrhoids: Secondary | ICD-10-CM | POA: Diagnosis not present

## 2020-02-29 DIAGNOSIS — I1310 Hypertensive heart and chronic kidney disease without heart failure, with stage 1 through stage 4 chronic kidney disease, or unspecified chronic kidney disease: Secondary | ICD-10-CM | POA: Insufficient documentation

## 2020-02-29 DIAGNOSIS — K5730 Diverticulosis of large intestine without perforation or abscess without bleeding: Secondary | ICD-10-CM | POA: Diagnosis not present

## 2020-02-29 LAB — URINALYSIS, COMPLETE (UACMP) WITH MICROSCOPIC
Bilirubin Urine: NEGATIVE
Glucose, UA: NEGATIVE mg/dL
Hgb urine dipstick: NEGATIVE
Ketones, ur: NEGATIVE mg/dL
Leukocytes,Ua: NEGATIVE
Nitrite: NEGATIVE
Protein, ur: NEGATIVE mg/dL
Specific Gravity, Urine: 1.023 (ref 1.005–1.030)
pH: 5 (ref 5.0–8.0)

## 2020-02-29 LAB — CBC WITH DIFFERENTIAL/PLATELET
Abs Immature Granulocytes: 0.13 10*3/uL — ABNORMAL HIGH (ref 0.00–0.07)
Basophils Absolute: 0.1 10*3/uL (ref 0.0–0.1)
Basophils Relative: 1 %
Eosinophils Absolute: 0.3 10*3/uL (ref 0.0–0.5)
Eosinophils Relative: 3 %
HCT: 49.6 % (ref 39.0–52.0)
Hemoglobin: 16.6 g/dL (ref 13.0–17.0)
Immature Granulocytes: 1 %
Lymphocytes Relative: 22 %
Lymphs Abs: 2.1 10*3/uL (ref 0.7–4.0)
MCH: 31.5 pg (ref 26.0–34.0)
MCHC: 33.5 g/dL (ref 30.0–36.0)
MCV: 94.1 fL (ref 80.0–100.0)
Monocytes Absolute: 0.6 10*3/uL (ref 0.1–1.0)
Monocytes Relative: 7 %
Neutro Abs: 6.5 10*3/uL (ref 1.7–7.7)
Neutrophils Relative %: 66 %
Platelets: 234 10*3/uL (ref 150–400)
RBC: 5.27 MIL/uL (ref 4.22–5.81)
RDW: 13 % (ref 11.5–15.5)
WBC: 9.8 10*3/uL (ref 4.0–10.5)
nRBC: 0 % (ref 0.0–0.2)

## 2020-02-29 LAB — CBC
HCT: 49.2 % (ref 39.0–52.0)
Hemoglobin: 16.5 g/dL (ref 13.0–17.0)
MCH: 31.6 pg (ref 26.0–34.0)
MCHC: 33.5 g/dL (ref 30.0–36.0)
MCV: 94.3 fL (ref 80.0–100.0)
Platelets: 247 10*3/uL (ref 150–400)
RBC: 5.22 MIL/uL (ref 4.22–5.81)
RDW: 13 % (ref 11.5–15.5)
WBC: 8.5 10*3/uL (ref 4.0–10.5)
nRBC: 0 % (ref 0.0–0.2)

## 2020-02-29 LAB — COMPREHENSIVE METABOLIC PANEL
ALT: 51 U/L — ABNORMAL HIGH (ref 0–44)
AST: 37 U/L (ref 15–41)
Albumin: 4.6 g/dL (ref 3.5–5.0)
Alkaline Phosphatase: 40 U/L (ref 38–126)
Anion gap: 9 (ref 5–15)
BUN: 35 mg/dL — ABNORMAL HIGH (ref 8–23)
CO2: 24 mmol/L (ref 22–32)
Calcium: 10.4 mg/dL — ABNORMAL HIGH (ref 8.9–10.3)
Chloride: 105 mmol/L (ref 98–111)
Creatinine, Ser: 1.43 mg/dL — ABNORMAL HIGH (ref 0.61–1.24)
GFR calc Af Amer: 54 mL/min — ABNORMAL LOW (ref >60)
GFR calc non Af Amer: 46 mL/min — ABNORMAL LOW (ref >60)
Glucose, Bld: 130 mg/dL — ABNORMAL HIGH (ref 70–99)
Potassium: 4.2 mmol/L (ref 3.5–5.1)
Sodium: 138 mmol/L (ref 135–145)
Total Bilirubin: 1.1 mg/dL (ref 0.3–1.2)
Total Protein: 7.4 g/dL (ref 6.5–8.1)

## 2020-02-29 MED ORDER — CEPHALEXIN 500 MG PO CAPS: 500.0000 mg | ORAL_CAPSULE | Freq: Three times a day (TID) | ORAL | 0 refills | Status: AC

## 2020-02-29 MED ORDER — IOHEXOL 300 MG/ML  SOLN
100.0000 mL | Freq: Once | INTRAMUSCULAR | Status: AC | PRN
  Administered 2020-02-29: 100 mL via INTRAVENOUS

## 2020-02-29 MED ORDER — SODIUM CHLORIDE 0.9 % IV BOLUS
1000.0000 mL | Freq: Once | INTRAVENOUS | Status: AC
  Administered 2020-02-29: 1000 mL via INTRAVENOUS

## 2020-02-29 MED ORDER — CEPHALEXIN 500 MG PO CAPS
500.0000 mg | ORAL_CAPSULE | Freq: Once | ORAL | Status: AC
  Administered 2020-02-29: 500 mg via ORAL
  Filled 2020-02-29: qty 1

## 2020-02-29 MED ORDER — HYDROCORTISONE ACETATE 25 MG RE SUPP: 25.0000 mg | Freq: Two times a day (BID) | RECTAL | 0 refills | Status: AC

## 2020-02-29 NOTE — ED Provider Notes (Signed)
[facility] EMERGENCY DEPARTMENT Provider Note   CSN: [id number] Arrival date & time: 02/29/20  1816     History Chief Complaint  Patient presents with  . Brendan Nguyen is a 79 y.o. male history of CKD, reflux, cerebellar ataxia here presenting with Brendan in the stool.  Patient has been having some constipation recently.  Wife noticed that he has Brendan when she wipes him.  Denies any abdominal pain or vomiting.  Patient is on Eliquis due to previous PEs.  Patient has also been weak for the last several weeks.  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  He was put on carbidopa previously but has not been helping so has been off of it.  Patient does have home health set up but apparently there is some insurance issues that needs to be resolved.  Has no falls at home or head injury.  The history is provided by the patient and the spouse.       Past Medical History:  Diagnosis Date  . BPH (benign prostatic hyperplasia)   . CKD (chronic kidney disease), stage III   . Elevated Brendan uric acid level   . GERD (gastroesophageal reflux disease)   . History of echocardiogram    a.02/2016 Echo: EF 50-55%. Mild LVH. Mild AI. Triv MR/TR.  Marland Kitchen Hyperparathyroidism ([place])   . Hypertension   . Hypertriglyceridemia   . Mixed hyperlipidemia   . TIA (transient ischemic attack) 2013    Patient Active Problem List   Diagnosis Date Noted  . Acute pulmonary embolism ([place]) 05/31/2019  . ACS (acute coronary syndrome) ([place]) 05/29/2019  . DNR (do not resuscitate) 07/26/2018  . BPH (benign prostatic hyperplasia) 04/25/2018  . Gait abnormality 01/18/2018  . Cerebellar ataxia in diseases classified elsewhere ([place]) 01/18/2018  . Rupture of Achilles tendon 08/16/2017  . Gout 08/11/2017  . Peroneal tendinitis 08/07/2017  . Abnormal gait 06/21/2017  . Mixed hyperlipidemia 03/30/2017  . Stage 3 chronic kidney disease 03/30/2017  .  Polyneuropathy 03/29/2017  . Dizzy spells 03/29/2017  . Vitamin D deficiency 12/30/2016  . Obesity (BMI 30-39.9) 09/12/2016  . Essential hypertriglyceridemia 08/25/2016  . 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 HBP (high Brendan pressure) 11/16/2015    Past Surgical History:  Procedure Laterality Date  . ACHILLES TENDON REPAIR    . BLEPHAROPLASTY    . L5 lamenectomy     had surgery but not the complete surgery  . l5-s1 laninectomy    . left inguinal hernia    . PULMONARY THROMBECTOMY N/A 05/31/2019   Procedure: PULMONARY THROMBECTOMY;  Surgeon: Katha Cabal, MD;  Location: [facility];  Service: Cardiovascular;  Laterality: N/A;       History reviewed. No pertinent family history.  Social History   Tobacco Use  . Smoking status: Never Smoker  . Smokeless tobacco: Never Used  Substance Use Topics  . Alcohol use: No    Alcohol/week: 0.0 standard drinks  . Drug use: No    Home Medications Prior to Admission medications   Medication Sig Start Date End Date Taking? Authorizing Provider  apixaban (ELIQUIS) 5 MG TABS tablet Take 2 tablets (10 mg total) by mouth 2 (two) times daily. Then take 5 mg (1 tab) bid  Starting 06/08/2019 Patient taking differently: Take 5 mg by mouth 2 (two) times daily.  06/01/19   Loletha Grayer, MD  fenofibrate 160 MG tablet Take 160 mg by mouth  daily.  03/09/17   [provider]  oxybutynin (DITROPAN-XL) 5 MG 24 hr tablet Take 1 tablet (5 mg total) by mouth daily. 01/07/19   Marcial Pacas, MD  VITAMIN D PO Take 400 Units by mouth daily.    [provider]    Allergies    Patient has no known allergies.  Review of Systems   Review of Systems  Gastrointestinal: Positive for Brendan in stool.  All other systems reviewed and are negative.   Physical Exam Updated Vital Signs BP (!) 163/78   Pulse 65   Temp 97.6 F (36.4 C) (Oral)   Resp 20   Ht 5' 7.5" (1.715 m)   Wt 91.2 kg   SpO2 96%   BMI 31.03 kg/m   Physical  Exam Vitals and nursing note reviewed.  Constitutional:      Comments: Chronically ill   HENT:     Head: Normocephalic.     Nose: Nose normal.     Mouth/Throat:     Mouth: Mucous membranes are moist.  Eyes:     Extraocular Movements: Extraocular movements intact.     Pupils: Pupils are equal, round, and reactive to light.  Cardiovascular:     Rate and Rhythm: Normal rate and regular rhythm.     Pulses: Normal pulses.     Heart sounds: Normal heart sounds.  Pulmonary:     Effort: Pulmonary effort is normal.     Breath sounds: Normal breath sounds.  Abdominal:     General: Abdomen is flat.     Palpations: Abdomen is soft.  Genitourinary:    Comments: Rectal- erythema around the rectum, ? Cellulitis vs perirectal abscess. Small internal hemorrhoid that is guiac positive with brown stools  Musculoskeletal:        General: Normal range of motion.     Cervical back: Normal range of motion.  Skin:    General: Skin is warm.     Capillary Refill: Capillary refill takes less than 2 seconds.  Neurological:     Comments: Strength 3/5 bilateral legs (chronic), nl reflexes bilaterally   Psychiatric:        Mood and Affect: Mood normal.     ED Results / Procedures / Treatments   Labs (all labs ordered are listed, but only abnormal results are displayed) Labs Reviewed  COMPREHENSIVE METABOLIC PANEL - Abnormal; Notable for the following components:      Result Value   Glucose, Bld 130 (*)    BUN 35 (*)    Creatinine, Ser 1.43 (*)    Calcium 10.4 (*)    ALT 51 (*)    GFR calc non Af Amer 46 (*)    GFR calc Af Amer 54 (*)    All other components within normal limits  CBC WITH DIFFERENTIAL/PLATELET - Abnormal; Notable for the following components:   Abs Immature Granulocytes 0.13 (*)    All other components within normal limits  URINALYSIS, COMPLETE (UACMP) WITH MICROSCOPIC - Abnormal; Notable for the following components:   Color, Urine YELLOW (*)    APPearance CLEAR (*)     Bacteria, UA RARE (*)    All other components within normal limits  CBC    EKG None  Radiology CT PELVIS W CONTRAST  Result Date: 02/29/2020 CLINICAL DATA:  Hematochezia. EXAM: CT PELVIS WITH CONTRAST TECHNIQUE: Multidetector CT imaging of the pelvis was performed using the standard protocol following the bolus administration of intravenous contrast. CONTRAST:  188mL OMNIPAQUE IOHEXOL 300 MG/ML  SOLN COMPARISON:  May 02, 2012. FINDINGS: Urinary Tract: There is a questionable mass involving the posterolateral bladder base measuring approximately 2.2 cm (axial series 2, image 37. This finding may related to the patient's significantly enlarged prostate gland but is suboptimally evaluated on this study. Bowel: There is severe sigmoid diverticulosis without definite CT evidence for diverticulitis. The visualized appendix is unremarkable. The visualized portions of the small bowel are unremarkable. Vascular/Lymphatic: There are atherosclerotic changes of the visualized vascular structures. There are few mildly prominent but morphologically normal appearing inguinal lymph nodes. There is no evidence for a pathologically enlarged pelvic lymph node. No pathologically enlarged lymph nodes along the iliac vasculature. Reproductive: The prostate gland is significantly enlarged measuring at least 5.7 by 5.2 cm. Other:  There is a fat containing right inguinal hernia. Musculoskeletal: No suspicious bone lesions identified. IMPRESSION: 1. Severe sigmoid diverticulosis without evidence of diverticulitis. 2. Markedly enlarged prostate gland. 3. Questionable mass involving the posterolateral bladder base. This may be related to the patient's enlarged prostate gland but is suboptimally evaluated on this study. Outpatient urology follow-up is recommended. Aortic Atherosclerosis (ICD10-I70.0). Electronically Signed   By: Constance Holster M.D.   On: 02/29/2020 21:59    Procedures Procedures (including critical care  time)  Medications Ordered in ED Medications  cephALEXin (KEFLEX) capsule 500 mg (has no administration in time range)  sodium chloride 0.9 % bolus 1,000 mL (1,000 mLs Intravenous New Bag/Given 02/29/20 2124)  iohexol (OMNIPAQUE) 300 MG/ML solution 100 mL (100 mLs Intravenous Contrast Given 02/29/20 2144)    ED Course  I have reviewed the triage vital signs and the nursing notes.  Pertinent labs & imaging results that were available during my care of the patient were reviewed by me and considered in my medical decision making (see chart for details).    MDM Rules/Calculators/A&P                      Brendan Nguyen is a 79 y.o. male presenting with Brendan in his stool.  I think this is likely secondary to hemorrhoid versus small perirectal abscess.  The larger issue however, is that he has been general deconditioning for the last several weeks.  This is likely secondary to his cerebellar ataxia.  Told wife that this appears to be a subacute issue that needs to be managed by his doctor.  I will try and set up home health again for him.    10:19 PM Labs are unremarkable. Repeat CBC shows hemoglobin is 16. His white Brendan cell count is normal. His CT showed no abscess. He does have an enlarged prostate. Wife said that he has follow-up with urology. Will discharge home with a course of Keflex for perirectal cellulitis as well as Anusol suppository. Patient will follow up with his neurologist. I ordered home health for him again.   Final Clinical Impression(s) / ED Diagnoses Final diagnoses:  None    Rx / DC Orders ED Discharge Orders         [place]     02/29/20 2147    Face-to-face encounter (required for Medicare/Medicaid patients)    Comments: I Wandra Arthurs certify that this patient is under my care and that I, or a nurse practitioner or physician's assistant working with me, had a face-to-face encounter that meets the physician face-to-face encounter requirements with  this patient on 02/29/2020. The encounter with the patient was in whole, or in part for the  following medical condition(s) which is the primary reason for home health care (List medical condition): cerebellar ataxia. Further orders from PCP   02/29/20 2147           Drenda Freeze, MD 02/29/20 2220

## 2020-02-29 NOTE — Discharge Instructions (Addendum)
Take keflex three times daily for a week   Use anusol suppository twice daily for a week   You have enlarged prostate so please see urology for follow up   See your neurologist   I ordered home health for you   Follow up with GI doctor if you have persistent bleeding   Return to ER if you have worse bleeding, worse weakness, fever, vomiting.

## 2020-02-29 NOTE — ED Notes (Signed)
MD Quentin Cornwall informed of pt and spouse's request for Eliquis. Per MD, pt may take home medication or wait until he is given a room and then receive night time dose of Eliquis. Spouse aware and is working on getting home medication here. This RN will help with facilitating this request.

## 2020-02-29 NOTE — ED Notes (Signed)
Wife reports slight signs of red blood following BM on paper after wiping. Denies any red or black color in stool. Redness noted around rectum. Pt is also unable to stand on own or pivot to be positioned. Required 2 RN assist to move from chair to bed. Wife is concerned due to deterioration and inability to help at home

## 2020-02-29 NOTE — ED Notes (Signed)
Pt's spouse to desk stating that "my husband is supposed to have eliquis every twelve hours, at 96, it's now seven, can you go and get that". This RN asked spouse if pt had brought his eliquis, spouse responded in the negative. Spouse states that pt's PA is on the way up here now. This RN stated to pt that our doctor would have to order the eliquis at this time. Spouse verbalized understanding.

## 2020-02-29 NOTE — ED Triage Notes (Addendum)
Pt arrived via [facility] from home with reports of bright pink blood when wiping this evening.   Pt states stools have not been black, states stools have been green 2 days ago.  Pt states over the past week has noticed blood when wiping. Pt takes Eliquis

## 2020-02-29 NOTE — ED Notes (Signed)
[facility] called for transport home 

## 2020-03-01 DIAGNOSIS — M2550 Pain in unspecified joint: Secondary | ICD-10-CM | POA: Diagnosis not present

## 2020-03-01 DIAGNOSIS — Z7401 Bed confinement status: Secondary | ICD-10-CM | POA: Diagnosis not present

## 2020-03-01 DIAGNOSIS — R58 Hemorrhage, not elsewhere classified: Secondary | ICD-10-CM | POA: Diagnosis not present

## 2020-03-03 DIAGNOSIS — E781 Pure hyperglyceridemia: Secondary | ICD-10-CM | POA: Diagnosis not present

## 2020-03-03 DIAGNOSIS — I129 Hypertensive chronic kidney disease with stage 1 through stage 4 chronic kidney disease, or unspecified chronic kidney disease: Secondary | ICD-10-CM | POA: Diagnosis not present

## 2020-03-03 DIAGNOSIS — I739 Peripheral vascular disease, unspecified: Secondary | ICD-10-CM | POA: Diagnosis not present

## 2020-03-03 DIAGNOSIS — N183 Chronic kidney disease, stage 3 unspecified: Secondary | ICD-10-CM | POA: Diagnosis not present

## 2020-03-03 DIAGNOSIS — G119 Hereditary ataxia, unspecified: Secondary | ICD-10-CM | POA: Diagnosis not present

## 2020-03-04 DIAGNOSIS — Z7901 Long term (current) use of anticoagulants: Secondary | ICD-10-CM | POA: Diagnosis not present

## 2020-03-04 DIAGNOSIS — I739 Peripheral vascular disease, unspecified: Secondary | ICD-10-CM | POA: Diagnosis not present

## 2020-03-04 DIAGNOSIS — I129 Hypertensive chronic kidney disease with stage 1 through stage 4 chronic kidney disease, or unspecified chronic kidney disease: Secondary | ICD-10-CM | POA: Diagnosis not present

## 2020-03-04 DIAGNOSIS — K219 Gastro-esophageal reflux disease without esophagitis: Secondary | ICD-10-CM | POA: Diagnosis not present

## 2020-03-04 DIAGNOSIS — G119 Hereditary ataxia, unspecified: Secondary | ICD-10-CM | POA: Diagnosis not present

## 2020-03-04 DIAGNOSIS — Z9181 History of falling: Secondary | ICD-10-CM | POA: Diagnosis not present

## 2020-03-04 DIAGNOSIS — E781 Pure hyperglyceridemia: Secondary | ICD-10-CM | POA: Diagnosis not present

## 2020-03-04 DIAGNOSIS — N400 Enlarged prostate without lower urinary tract symptoms: Secondary | ICD-10-CM | POA: Diagnosis not present

## 2020-03-04 DIAGNOSIS — E559 Vitamin D deficiency, unspecified: Secondary | ICD-10-CM | POA: Diagnosis not present

## 2020-03-04 DIAGNOSIS — N183 Chronic kidney disease, stage 3 unspecified: Secondary | ICD-10-CM | POA: Diagnosis not present

## 2020-03-09 DIAGNOSIS — I129 Hypertensive chronic kidney disease with stage 1 through stage 4 chronic kidney disease, or unspecified chronic kidney disease: Secondary | ICD-10-CM | POA: Diagnosis not present

## 2020-03-09 DIAGNOSIS — N183 Chronic kidney disease, stage 3 unspecified: Secondary | ICD-10-CM | POA: Diagnosis not present

## 2020-03-09 DIAGNOSIS — G119 Hereditary ataxia, unspecified: Secondary | ICD-10-CM | POA: Diagnosis not present

## 2020-03-09 DIAGNOSIS — N400 Enlarged prostate without lower urinary tract symptoms: Secondary | ICD-10-CM | POA: Diagnosis not present

## 2020-03-09 DIAGNOSIS — Z9181 History of falling: Secondary | ICD-10-CM | POA: Diagnosis not present

## 2020-03-09 DIAGNOSIS — K219 Gastro-esophageal reflux disease without esophagitis: Secondary | ICD-10-CM | POA: Diagnosis not present

## 2020-03-09 DIAGNOSIS — E559 Vitamin D deficiency, unspecified: Secondary | ICD-10-CM | POA: Diagnosis not present

## 2020-03-09 DIAGNOSIS — E781 Pure hyperglyceridemia: Secondary | ICD-10-CM | POA: Diagnosis not present

## 2020-03-09 DIAGNOSIS — Z7901 Long term (current) use of anticoagulants: Secondary | ICD-10-CM | POA: Diagnosis not present

## 2020-03-09 DIAGNOSIS — I739 Peripheral vascular disease, unspecified: Secondary | ICD-10-CM | POA: Diagnosis not present

## 2020-03-16 DIAGNOSIS — I129 Hypertensive chronic kidney disease with stage 1 through stage 4 chronic kidney disease, or unspecified chronic kidney disease: Secondary | ICD-10-CM | POA: Diagnosis not present

## 2020-03-16 DIAGNOSIS — N183 Chronic kidney disease, stage 3 unspecified: Secondary | ICD-10-CM | POA: Diagnosis not present

## 2020-03-16 DIAGNOSIS — K219 Gastro-esophageal reflux disease without esophagitis: Secondary | ICD-10-CM | POA: Diagnosis not present

## 2020-03-16 DIAGNOSIS — G119 Hereditary ataxia, unspecified: Secondary | ICD-10-CM | POA: Diagnosis not present

## 2020-03-16 DIAGNOSIS — Z9181 History of falling: Secondary | ICD-10-CM | POA: Diagnosis not present

## 2020-03-16 DIAGNOSIS — I739 Peripheral vascular disease, unspecified: Secondary | ICD-10-CM | POA: Diagnosis not present

## 2020-03-16 DIAGNOSIS — N400 Enlarged prostate without lower urinary tract symptoms: Secondary | ICD-10-CM | POA: Diagnosis not present

## 2020-03-16 DIAGNOSIS — E559 Vitamin D deficiency, unspecified: Secondary | ICD-10-CM | POA: Diagnosis not present

## 2020-03-16 DIAGNOSIS — Z7901 Long term (current) use of anticoagulants: Secondary | ICD-10-CM | POA: Diagnosis not present

## 2020-03-16 DIAGNOSIS — E781 Pure hyperglyceridemia: Secondary | ICD-10-CM | POA: Diagnosis not present

## 2020-04-13 ENCOUNTER — Telehealth: Payer: Self-pay | Admitting: Adult Health Nurse Practitioner

## 2020-04-13 NOTE — Telephone Encounter (Signed)
Spoke with patient's wife [name], and she said that she was in the middle of something and requested that I call her back in a few minutes.  4:20 PM:  Called wife back and spoke with her regarding Palliative services and all questions were answered and she was in agreement with this.  I have scheduled an In-person Consult for 04/20/20 @ 1 PM.

## 2020-04-20 ENCOUNTER — Other Ambulatory Visit: Payer: Self-pay

## 2020-04-20 ENCOUNTER — Other Ambulatory Visit: Payer: PPO | Admitting: Adult Health Nurse Practitioner

## 2020-04-20 DIAGNOSIS — F0280 Dementia in other diseases classified elsewhere without behavioral disturbance: Secondary | ICD-10-CM

## 2020-04-20 DIAGNOSIS — Z515 Encounter for palliative care: Secondary | ICD-10-CM | POA: Diagnosis not present

## 2020-04-20 NOTE — Progress Notes (Signed)
[facility] Consult Note Telephone: [phone]  Fax: [phone]  PATIENT NAME: Brendan Nguyen DOB: [birth year] MRN: [id number]  PRIMARY CARE PROVIDER:   Dion Body, MD  REFERRING PROVIDER:  Dion Body, MD [facility] [place],  [place] [address]  RESPONSIBLE PARTY:   Larrion Ringger, wife H: [phone] C: [phone]    RECOMMENDATIONS and PLAN:  1.  Advanced care planning.  Patient is a DNR  2.  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  Patient has increased weakness.  He is having freezing episodes.  Has tried sinemet in the past but this caused diarrhea. Over the past month he has grown weaker and is unable to use his walker like he used to.  Patient requires total assistance with ADLs.  Appetite is good with no reported weight loss.  Current weight is 200 pounds.  Denies falls.  He has urinary incontinence and wife gets him on bedside commode daily for BMs.  Denies N/V/D, constipation, SOB, headaches, dizziness, pain. Wife states that his cognition is best in the morning but around 2pm he starts getting more confused and sundowns, Denies agitation or hallucinations.  Patent sleeps well at night. Continue supportive care at home   3.  Support.  Family has aides that come into home through [facility] from 7-9pm.  They have had issues with aides coming when they are supposed to and are left without help.  Wife states that they have called other agencies and some are too pricey, some have minimum hours/days per week that they do not to pay for, and others have not gotten back to them.  They have had issues with past PT/OT not fulfilling what they have said they were going to do to help.  They feel like that they are not getting the support they need.  Have reached out to SW with concerns and she will get with them by the end of the week to further evaluate what can be done get  them the help they need.   Overall patient is having functional decline and family requiring more help in lthe home.  Have reached out to SW for support. Palliative will continue to monitor for symptom management/decline and make recommendations as needed.  This provided seeing this patient for another provider and will have regular provider reach out to wife to schedule next appointment.  I spent 110 minutes providing this consultation,  from 1:00 to 2:50 including time spent with patient/family, chart review, provider coordination, documentation. More than 50% of the time in this consultation was spent coordinating communication.   HISTORY OF PRESENT ILLNESS:  Brendan Nguyen is a 79 y.o. year old male with multiple medical problems including Alzheimer's dementia, cerebellar ataxia, Parkinsonian syndrome, CKD stage 3, BPH, HTN, h/o TIA. Palliative Care was asked to help address goals of care.   CODE STATUS: DNR  PPS: 40% HOSPICE ELIGIBILITY/DIAGNOSIS: TBD  PHYSICAL EXAM:  HR 68 O2 94% on RA General: NAD, frail appearing Cardiovascular: regular rate and rhythm Pulmonary: lung sounds clear; normal respiratory effort Abdomen: soft, nontender, + bowel sounds GU: no suprapubic tenderness Extremities: no edema, no joint deformities Skin: no rashes on exposed skin Neurological: Weakness; A&O to person and place; has forgetfulness and sundowning   PAST MEDICAL HISTORY:  Past Medical History:  Diagnosis Date  . BPH (benign prostatic hyperplasia)   . CKD (chronic kidney disease), stage III   . Elevated blood uric acid level   .  GERD (gastroesophageal reflux disease)   . History of echocardiogram    a.02/2016 Echo: EF 50-55%. Mild LVH. Mild AI. Triv MR/TR.  Marland Kitchen Hyperparathyroidism ([place])   . Hypertension   . Hypertriglyceridemia   . Mixed hyperlipidemia   . TIA (transient ischemic attack) 2013    SOCIAL HX:  Social History   Tobacco Use  . Smoking status: Never Smoker  . Smokeless  tobacco: Never Used  Substance Use Topics  . Alcohol use: No    Alcohol/week: 0.0 standard drinks    ALLERGIES: No Known Allergies   PERTINENT MEDICATIONS:  Outpatient Encounter Medications as of 04/20/2020  Medication Sig  . apixaban (ELIQUIS) 5 MG TABS tablet Take 2 tablets (10 mg total) by mouth 2 (two) times daily. Then take 5 mg (1 tab) bid  Starting 06/08/2019 (Patient taking differently: Take 5 mg by mouth 2 (two) times daily. )  . fenofibrate 160 MG tablet Take 160 mg by mouth daily.   . hydrocortisone (ANUSOL-HC) 25 MG suppository Place 1 suppository (25 mg total) rectally 2 (two) times daily. For 7 days  . oxybutynin (DITROPAN-XL) 5 MG 24 hr tablet Take 1 tablet (5 mg total) by mouth daily.  Marland Kitchen VITAMIN D PO Take 400 Units by mouth daily.   No facility-administered encounter medications on file as of 04/20/2020.     Benisha Hadaway Jenetta Downer, NP

## 2020-04-22 ENCOUNTER — Telehealth: Payer: Self-pay

## 2020-04-23 NOTE — Telephone Encounter (Signed)
SW received request from Amy, NP to contact patient/family. SW attempted to contact [name] (patient's wife). SW left VM with contact information.

## 2020-05-04 ENCOUNTER — Telehealth: Payer: Self-pay

## 2020-05-04 NOTE — Telephone Encounter (Signed)
SW received message from triage requesting SW call [name] (patient's wife) to discuss resources. SW contacted [employer]. [name] provided brief overview and update on patient's care. [name] noted multiple falls in the past few weeks. [name] said patient is dependent of most ADLs. [name] reports that her sisters have been helping but that they are leaving tomorrow. [name] has concerns for managing patient in the home. [name] acknowledged her goal of keeping patient at home, if able. Discussed in-home care options - private pay versus agencies. [name] is going to look into this. SW also encourage [name] to reach out to [employer], neighbors, community, Social research officer, government. [name] noted issues with patient's catheter. [name] also said that she has requested a hospital bed and has had no luck in receiving one. SW notified NPs of conversation and catheter concerns. SW also reached out to [place], Therapist, sports with palliative care for assistance with hospital bed order. SW advised [name] that palliative care team would follow-up.

## 2020-05-07 ENCOUNTER — Telehealth: Payer: Self-pay

## 2020-05-07 DIAGNOSIS — I739 Peripheral vascular disease, unspecified: Secondary | ICD-10-CM | POA: Diagnosis not present

## 2020-05-07 DIAGNOSIS — E781 Pure hyperglyceridemia: Secondary | ICD-10-CM | POA: Diagnosis not present

## 2020-05-07 DIAGNOSIS — G119 Hereditary ataxia, unspecified: Secondary | ICD-10-CM | POA: Diagnosis not present

## 2020-05-07 DIAGNOSIS — I129 Hypertensive chronic kidney disease with stage 1 through stage 4 chronic kidney disease, or unspecified chronic kidney disease: Secondary | ICD-10-CM | POA: Diagnosis not present

## 2020-05-07 DIAGNOSIS — N183 Chronic kidney disease, stage 3 unspecified: Secondary | ICD-10-CM | POA: Diagnosis not present

## 2020-05-07 NOTE — Telephone Encounter (Signed)
Phone call placed to [facility] to follow up regarding request for hospital bed with a trapeze. Per [employer], documentation received from PCP office and is being reviewed. [employer] will reach out to patient's wife. Palliative care Social Worker updated

## 2020-05-15 ENCOUNTER — Telehealth: Payer: Self-pay

## 2020-05-15 DIAGNOSIS — N1831 Chronic kidney disease, stage 3a: Secondary | ICD-10-CM | POA: Diagnosis not present

## 2020-05-15 DIAGNOSIS — Z86711 Personal history of pulmonary embolism: Secondary | ICD-10-CM | POA: Diagnosis not present

## 2020-05-15 DIAGNOSIS — Z515 Encounter for palliative care: Secondary | ICD-10-CM | POA: Diagnosis not present

## 2020-05-15 DIAGNOSIS — E669 Obesity, unspecified: Secondary | ICD-10-CM | POA: Diagnosis not present

## 2020-05-15 DIAGNOSIS — N3942 Incontinence without sensory awareness: Secondary | ICD-10-CM | POA: Diagnosis not present

## 2020-05-15 DIAGNOSIS — G119 Hereditary ataxia, unspecified: Secondary | ICD-10-CM | POA: Diagnosis not present

## 2020-05-15 NOTE — Telephone Encounter (Signed)
SW received VM from [place] (patient's wife) requesting a call back regarding supplies. SW returned call to discuss. Brendan Nguyen wanted to know if palliative care provided supplies (I.e-chux pads, depends, etc). SW advised that we do not cover those items under palliative care. No additional questions/ concerns at this time.

## 2020-05-19 ENCOUNTER — Other Ambulatory Visit: Payer: PPO | Admitting: Nurse Practitioner

## 2020-05-19 ENCOUNTER — Other Ambulatory Visit: Payer: Self-pay

## 2020-05-19 ENCOUNTER — Encounter: Payer: Self-pay | Admitting: Nurse Practitioner

## 2020-05-19 DIAGNOSIS — Z515 Encounter for palliative care: Secondary | ICD-10-CM | POA: Diagnosis not present

## 2020-05-19 DIAGNOSIS — G309 Alzheimer's disease, unspecified: Secondary | ICD-10-CM | POA: Diagnosis not present

## 2020-05-19 DIAGNOSIS — F0280 Dementia in other diseases classified elsewhere without behavioral disturbance: Secondary | ICD-10-CM
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
## 2020-05-25 DIAGNOSIS — H9190 Unspecified hearing loss, unspecified ear: Secondary | ICD-10-CM | POA: Diagnosis not present

## 2020-05-25 DIAGNOSIS — N401 Enlarged prostate with lower urinary tract symptoms: Secondary | ICD-10-CM | POA: Diagnosis not present

## 2020-05-25 DIAGNOSIS — G119 Hereditary ataxia, unspecified: Secondary | ICD-10-CM | POA: Diagnosis not present

## 2020-05-25 DIAGNOSIS — N1831 Chronic kidney disease, stage 3a: Secondary | ICD-10-CM | POA: Diagnosis not present

## 2020-05-25 DIAGNOSIS — I739 Peripheral vascular disease, unspecified: Secondary | ICD-10-CM | POA: Diagnosis not present

## 2020-05-25 DIAGNOSIS — E559 Vitamin D deficiency, unspecified: Secondary | ICD-10-CM | POA: Diagnosis not present

## 2020-05-25 DIAGNOSIS — Z86718 Personal history of other venous thrombosis and embolism: Secondary | ICD-10-CM | POA: Diagnosis not present

## 2020-05-25 DIAGNOSIS — I129 Hypertensive chronic kidney disease with stage 1 through stage 4 chronic kidney disease, or unspecified chronic kidney disease: Secondary | ICD-10-CM | POA: Diagnosis not present

## 2020-05-25 DIAGNOSIS — K219 Gastro-esophageal reflux disease without esophagitis: Secondary | ICD-10-CM | POA: Diagnosis not present

## 2020-05-25 DIAGNOSIS — E669 Obesity, unspecified: Secondary | ICD-10-CM | POA: Diagnosis not present

## 2020-05-25 DIAGNOSIS — Z7901 Long term (current) use of anticoagulants: Secondary | ICD-10-CM | POA: Diagnosis not present

## 2020-05-25 DIAGNOSIS — Z6830 Body mass index (BMI) 30.0-30.9, adult: Secondary | ICD-10-CM | POA: Diagnosis not present

## 2020-05-25 DIAGNOSIS — Z9181 History of falling: Secondary | ICD-10-CM | POA: Diagnosis not present

## 2020-05-25 DIAGNOSIS — N3942 Incontinence without sensory awareness: Secondary | ICD-10-CM | POA: Diagnosis not present

## 2020-05-25 DIAGNOSIS — E781 Pure hyperglyceridemia: Secondary | ICD-10-CM | POA: Diagnosis not present

## 2020-05-28 DIAGNOSIS — G119 Hereditary ataxia, unspecified: Secondary | ICD-10-CM | POA: Diagnosis not present

## 2020-05-28 DIAGNOSIS — I129 Hypertensive chronic kidney disease with stage 1 through stage 4 chronic kidney disease, or unspecified chronic kidney disease: Secondary | ICD-10-CM | POA: Diagnosis not present

## 2020-05-28 DIAGNOSIS — Z6830 Body mass index (BMI) 30.0-30.9, adult: Secondary | ICD-10-CM | POA: Diagnosis not present

## 2020-05-28 DIAGNOSIS — E559 Vitamin D deficiency, unspecified: Secondary | ICD-10-CM | POA: Diagnosis not present

## 2020-05-28 DIAGNOSIS — E781 Pure hyperglyceridemia: Secondary | ICD-10-CM | POA: Diagnosis not present

## 2020-05-28 DIAGNOSIS — N3942 Incontinence without sensory awareness: Secondary | ICD-10-CM | POA: Diagnosis not present

## 2020-05-28 DIAGNOSIS — Z9181 History of falling: Secondary | ICD-10-CM | POA: Diagnosis not present

## 2020-05-28 DIAGNOSIS — Z86718 Personal history of other venous thrombosis and embolism: Secondary | ICD-10-CM | POA: Diagnosis not present

## 2020-05-28 DIAGNOSIS — E669 Obesity, unspecified: Secondary | ICD-10-CM | POA: Diagnosis not present

## 2020-05-28 DIAGNOSIS — H9190 Unspecified hearing loss, unspecified ear: Secondary | ICD-10-CM | POA: Diagnosis not present

## 2020-05-28 DIAGNOSIS — K219 Gastro-esophageal reflux disease without esophagitis: Secondary | ICD-10-CM | POA: Diagnosis not present

## 2020-05-28 DIAGNOSIS — Z7901 Long term (current) use of anticoagulants: Secondary | ICD-10-CM | POA: Diagnosis not present

## 2020-05-28 DIAGNOSIS — N401 Enlarged prostate with lower urinary tract symptoms: Secondary | ICD-10-CM | POA: Diagnosis not present

## 2020-05-28 DIAGNOSIS — N1831 Chronic kidney disease, stage 3a: Secondary | ICD-10-CM | POA: Diagnosis not present

## 2020-05-28 DIAGNOSIS — I739 Peripheral vascular disease, unspecified: Secondary | ICD-10-CM | POA: Diagnosis not present

## 2020-05-29 DIAGNOSIS — G119 Hereditary ataxia, unspecified: Secondary | ICD-10-CM | POA: Diagnosis not present

## 2020-05-29 DIAGNOSIS — E559 Vitamin D deficiency, unspecified: Secondary | ICD-10-CM | POA: Diagnosis not present

## 2020-05-29 DIAGNOSIS — E669 Obesity, unspecified: Secondary | ICD-10-CM | POA: Diagnosis not present

## 2020-05-29 DIAGNOSIS — Z6830 Body mass index (BMI) 30.0-30.9, adult: Secondary | ICD-10-CM | POA: Diagnosis not present

## 2020-05-29 DIAGNOSIS — N1831 Chronic kidney disease, stage 3a: Secondary | ICD-10-CM | POA: Diagnosis not present

## 2020-05-29 DIAGNOSIS — I739 Peripheral vascular disease, unspecified: Secondary | ICD-10-CM | POA: Diagnosis not present

## 2020-05-29 DIAGNOSIS — E781 Pure hyperglyceridemia: Secondary | ICD-10-CM | POA: Diagnosis not present

## 2020-05-29 DIAGNOSIS — Z9181 History of falling: Secondary | ICD-10-CM | POA: Diagnosis not present

## 2020-05-29 DIAGNOSIS — Z86718 Personal history of other venous thrombosis and embolism: Secondary | ICD-10-CM | POA: Diagnosis not present

## 2020-05-29 DIAGNOSIS — K219 Gastro-esophageal reflux disease without esophagitis: Secondary | ICD-10-CM | POA: Diagnosis not present

## 2020-05-29 DIAGNOSIS — I129 Hypertensive chronic kidney disease with stage 1 through stage 4 chronic kidney disease, or unspecified chronic kidney disease: Secondary | ICD-10-CM | POA: Diagnosis not present

## 2020-05-29 DIAGNOSIS — N401 Enlarged prostate with lower urinary tract symptoms: Secondary | ICD-10-CM | POA: Diagnosis not present

## 2020-05-29 DIAGNOSIS — H9190 Unspecified hearing loss, unspecified ear: Secondary | ICD-10-CM | POA: Diagnosis not present

## 2020-05-29 DIAGNOSIS — N3942 Incontinence without sensory awareness: Secondary | ICD-10-CM | POA: Diagnosis not present

## 2020-05-29 DIAGNOSIS — Z7901 Long term (current) use of anticoagulants: Secondary | ICD-10-CM | POA: Diagnosis not present

## 2020-06-04 DIAGNOSIS — N1831 Chronic kidney disease, stage 3a: Secondary | ICD-10-CM | POA: Diagnosis not present

## 2020-06-04 DIAGNOSIS — R52 Pain, unspecified: Secondary | ICD-10-CM | POA: Diagnosis not present

## 2020-06-04 DIAGNOSIS — M545 Low back pain: Secondary | ICD-10-CM | POA: Diagnosis not present

## 2020-06-04 DIAGNOSIS — G119 Hereditary ataxia, unspecified: Secondary | ICD-10-CM | POA: Diagnosis not present

## 2020-06-04 DIAGNOSIS — M5489 Other dorsalgia: Secondary | ICD-10-CM | POA: Diagnosis not present

## 2020-06-04 DIAGNOSIS — I2699 Other pulmonary embolism without acute cor pulmonale: Secondary | ICD-10-CM | POA: Diagnosis not present

## 2020-06-05 DIAGNOSIS — I129 Hypertensive chronic kidney disease with stage 1 through stage 4 chronic kidney disease, or unspecified chronic kidney disease: Secondary | ICD-10-CM | POA: Diagnosis not present

## 2020-06-05 DIAGNOSIS — Z86718 Personal history of other venous thrombosis and embolism: Secondary | ICD-10-CM | POA: Diagnosis not present

## 2020-06-05 DIAGNOSIS — G119 Hereditary ataxia, unspecified: Secondary | ICD-10-CM | POA: Diagnosis not present

## 2020-06-05 DIAGNOSIS — Z9181 History of falling: Secondary | ICD-10-CM | POA: Diagnosis not present

## 2020-06-05 DIAGNOSIS — I739 Peripheral vascular disease, unspecified: Secondary | ICD-10-CM | POA: Diagnosis not present

## 2020-06-05 DIAGNOSIS — E559 Vitamin D deficiency, unspecified: Secondary | ICD-10-CM | POA: Diagnosis not present

## 2020-06-05 DIAGNOSIS — K219 Gastro-esophageal reflux disease without esophagitis: Secondary | ICD-10-CM | POA: Diagnosis not present

## 2020-06-05 DIAGNOSIS — N401 Enlarged prostate with lower urinary tract symptoms: Secondary | ICD-10-CM | POA: Diagnosis not present

## 2020-06-05 DIAGNOSIS — N3942 Incontinence without sensory awareness: Secondary | ICD-10-CM | POA: Diagnosis not present

## 2020-06-05 DIAGNOSIS — Z6830 Body mass index (BMI) 30.0-30.9, adult: Secondary | ICD-10-CM | POA: Diagnosis not present

## 2020-06-05 DIAGNOSIS — Z7901 Long term (current) use of anticoagulants: Secondary | ICD-10-CM | POA: Diagnosis not present

## 2020-06-05 DIAGNOSIS — E781 Pure hyperglyceridemia: Secondary | ICD-10-CM | POA: Diagnosis not present

## 2020-06-05 DIAGNOSIS — E669 Obesity, unspecified: Secondary | ICD-10-CM | POA: Diagnosis not present

## 2020-06-05 DIAGNOSIS — N1831 Chronic kidney disease, stage 3a: Secondary | ICD-10-CM | POA: Diagnosis not present

## 2020-06-05 DIAGNOSIS — H9190 Unspecified hearing loss, unspecified ear: Secondary | ICD-10-CM | POA: Diagnosis not present

## 2020-06-09 DIAGNOSIS — N401 Enlarged prostate with lower urinary tract symptoms: Secondary | ICD-10-CM | POA: Diagnosis not present

## 2020-06-09 DIAGNOSIS — I129 Hypertensive chronic kidney disease with stage 1 through stage 4 chronic kidney disease, or unspecified chronic kidney disease: Secondary | ICD-10-CM | POA: Diagnosis not present

## 2020-06-09 DIAGNOSIS — N1831 Chronic kidney disease, stage 3a: Secondary | ICD-10-CM | POA: Diagnosis not present

## 2020-06-09 DIAGNOSIS — G119 Hereditary ataxia, unspecified: Secondary | ICD-10-CM | POA: Diagnosis not present

## 2020-06-09 DIAGNOSIS — E781 Pure hyperglyceridemia: Secondary | ICD-10-CM | POA: Diagnosis not present

## 2020-06-09 DIAGNOSIS — I739 Peripheral vascular disease, unspecified: Secondary | ICD-10-CM | POA: Diagnosis not present

## 2020-06-09 DIAGNOSIS — E559 Vitamin D deficiency, unspecified: Secondary | ICD-10-CM | POA: Diagnosis not present

## 2020-06-10 DIAGNOSIS — Z6830 Body mass index (BMI) 30.0-30.9, adult: Secondary | ICD-10-CM | POA: Diagnosis not present

## 2020-06-10 DIAGNOSIS — G119 Hereditary ataxia, unspecified: Secondary | ICD-10-CM | POA: Diagnosis not present

## 2020-06-10 DIAGNOSIS — N1831 Chronic kidney disease, stage 3a: Secondary | ICD-10-CM | POA: Diagnosis not present

## 2020-06-10 DIAGNOSIS — K219 Gastro-esophageal reflux disease without esophagitis: Secondary | ICD-10-CM | POA: Diagnosis not present

## 2020-06-10 DIAGNOSIS — I739 Peripheral vascular disease, unspecified: Secondary | ICD-10-CM | POA: Diagnosis not present

## 2020-06-10 DIAGNOSIS — Z9181 History of falling: Secondary | ICD-10-CM | POA: Diagnosis not present

## 2020-06-10 DIAGNOSIS — E559 Vitamin D deficiency, unspecified: Secondary | ICD-10-CM | POA: Diagnosis not present

## 2020-06-10 DIAGNOSIS — E781 Pure hyperglyceridemia: Secondary | ICD-10-CM | POA: Diagnosis not present

## 2020-06-10 DIAGNOSIS — N401 Enlarged prostate with lower urinary tract symptoms: Secondary | ICD-10-CM | POA: Diagnosis not present

## 2020-06-10 DIAGNOSIS — N3942 Incontinence without sensory awareness: Secondary | ICD-10-CM | POA: Diagnosis not present

## 2020-06-10 DIAGNOSIS — I129 Hypertensive chronic kidney disease with stage 1 through stage 4 chronic kidney disease, or unspecified chronic kidney disease: Secondary | ICD-10-CM | POA: Diagnosis not present

## 2020-06-10 DIAGNOSIS — Z7901 Long term (current) use of anticoagulants: Secondary | ICD-10-CM | POA: Diagnosis not present

## 2020-06-10 DIAGNOSIS — H9190 Unspecified hearing loss, unspecified ear: Secondary | ICD-10-CM | POA: Diagnosis not present

## 2020-06-10 DIAGNOSIS — Z86718 Personal history of other venous thrombosis and embolism: Secondary | ICD-10-CM | POA: Diagnosis not present

## 2020-06-10 DIAGNOSIS — E669 Obesity, unspecified: Secondary | ICD-10-CM | POA: Diagnosis not present

## 2020-06-11 DIAGNOSIS — N3942 Incontinence without sensory awareness: Secondary | ICD-10-CM | POA: Diagnosis not present

## 2020-06-11 DIAGNOSIS — N401 Enlarged prostate with lower urinary tract symptoms: Secondary | ICD-10-CM | POA: Diagnosis not present

## 2020-06-11 DIAGNOSIS — N1831 Chronic kidney disease, stage 3a: Secondary | ICD-10-CM | POA: Diagnosis not present

## 2020-06-11 DIAGNOSIS — Z7901 Long term (current) use of anticoagulants: Secondary | ICD-10-CM | POA: Diagnosis not present

## 2020-06-11 DIAGNOSIS — I129 Hypertensive chronic kidney disease with stage 1 through stage 4 chronic kidney disease, or unspecified chronic kidney disease: Secondary | ICD-10-CM | POA: Diagnosis not present

## 2020-06-11 DIAGNOSIS — E559 Vitamin D deficiency, unspecified: Secondary | ICD-10-CM | POA: Diagnosis not present

## 2020-06-11 DIAGNOSIS — I739 Peripheral vascular disease, unspecified: Secondary | ICD-10-CM | POA: Diagnosis not present

## 2020-06-11 DIAGNOSIS — E781 Pure hyperglyceridemia: Secondary | ICD-10-CM | POA: Diagnosis not present

## 2020-06-11 DIAGNOSIS — K219 Gastro-esophageal reflux disease without esophagitis: Secondary | ICD-10-CM | POA: Diagnosis not present

## 2020-06-11 DIAGNOSIS — Z9181 History of falling: Secondary | ICD-10-CM | POA: Diagnosis not present

## 2020-06-11 DIAGNOSIS — G119 Hereditary ataxia, unspecified: Secondary | ICD-10-CM | POA: Diagnosis not present

## 2020-06-11 DIAGNOSIS — H9190 Unspecified hearing loss, unspecified ear: Secondary | ICD-10-CM | POA: Diagnosis not present

## 2020-06-11 DIAGNOSIS — Z86718 Personal history of other venous thrombosis and embolism: Secondary | ICD-10-CM | POA: Diagnosis not present

## 2020-06-11 DIAGNOSIS — E669 Obesity, unspecified: Secondary | ICD-10-CM | POA: Diagnosis not present

## 2020-06-11 DIAGNOSIS — Z6830 Body mass index (BMI) 30.0-30.9, adult: Secondary | ICD-10-CM | POA: Diagnosis not present

## 2020-06-12 DIAGNOSIS — E669 Obesity, unspecified: Secondary | ICD-10-CM | POA: Diagnosis not present

## 2020-06-12 DIAGNOSIS — I739 Peripheral vascular disease, unspecified: Secondary | ICD-10-CM | POA: Diagnosis not present

## 2020-06-12 DIAGNOSIS — N401 Enlarged prostate with lower urinary tract symptoms: Secondary | ICD-10-CM | POA: Diagnosis not present

## 2020-06-12 DIAGNOSIS — Z6830 Body mass index (BMI) 30.0-30.9, adult: Secondary | ICD-10-CM | POA: Diagnosis not present

## 2020-06-12 DIAGNOSIS — G119 Hereditary ataxia, unspecified: Secondary | ICD-10-CM | POA: Diagnosis not present

## 2020-06-12 DIAGNOSIS — K219 Gastro-esophageal reflux disease without esophagitis: Secondary | ICD-10-CM | POA: Diagnosis not present

## 2020-06-12 DIAGNOSIS — I129 Hypertensive chronic kidney disease with stage 1 through stage 4 chronic kidney disease, or unspecified chronic kidney disease: Secondary | ICD-10-CM | POA: Diagnosis not present

## 2020-06-12 DIAGNOSIS — H9190 Unspecified hearing loss, unspecified ear: Secondary | ICD-10-CM | POA: Diagnosis not present

## 2020-06-12 DIAGNOSIS — E781 Pure hyperglyceridemia: Secondary | ICD-10-CM | POA: Diagnosis not present

## 2020-06-12 DIAGNOSIS — N3942 Incontinence without sensory awareness: Secondary | ICD-10-CM | POA: Diagnosis not present

## 2020-06-12 DIAGNOSIS — Z9181 History of falling: Secondary | ICD-10-CM | POA: Diagnosis not present

## 2020-06-12 DIAGNOSIS — Z7901 Long term (current) use of anticoagulants: Secondary | ICD-10-CM | POA: Diagnosis not present

## 2020-06-12 DIAGNOSIS — N1831 Chronic kidney disease, stage 3a: Secondary | ICD-10-CM | POA: Diagnosis not present

## 2020-06-12 DIAGNOSIS — E559 Vitamin D deficiency, unspecified: Secondary | ICD-10-CM | POA: Diagnosis not present

## 2020-06-12 DIAGNOSIS — Z86718 Personal history of other venous thrombosis and embolism: Secondary | ICD-10-CM | POA: Diagnosis not present

## 2020-06-16 DIAGNOSIS — E669 Obesity, unspecified: Secondary | ICD-10-CM | POA: Diagnosis not present

## 2020-06-16 DIAGNOSIS — I129 Hypertensive chronic kidney disease with stage 1 through stage 4 chronic kidney disease, or unspecified chronic kidney disease: Secondary | ICD-10-CM | POA: Diagnosis not present

## 2020-06-16 DIAGNOSIS — E559 Vitamin D deficiency, unspecified: Secondary | ICD-10-CM | POA: Diagnosis not present

## 2020-06-16 DIAGNOSIS — N401 Enlarged prostate with lower urinary tract symptoms: Secondary | ICD-10-CM | POA: Diagnosis not present

## 2020-06-16 DIAGNOSIS — Z7901 Long term (current) use of anticoagulants: Secondary | ICD-10-CM | POA: Diagnosis not present

## 2020-06-16 DIAGNOSIS — Z86718 Personal history of other venous thrombosis and embolism: Secondary | ICD-10-CM | POA: Diagnosis not present

## 2020-06-16 DIAGNOSIS — Z9181 History of falling: Secondary | ICD-10-CM | POA: Diagnosis not present

## 2020-06-16 DIAGNOSIS — E781 Pure hyperglyceridemia: Secondary | ICD-10-CM | POA: Diagnosis not present

## 2020-06-16 DIAGNOSIS — G119 Hereditary ataxia, unspecified: Secondary | ICD-10-CM | POA: Diagnosis not present

## 2020-06-16 DIAGNOSIS — I739 Peripheral vascular disease, unspecified: Secondary | ICD-10-CM | POA: Diagnosis not present

## 2020-06-16 DIAGNOSIS — K219 Gastro-esophageal reflux disease without esophagitis: Secondary | ICD-10-CM | POA: Diagnosis not present

## 2020-06-16 DIAGNOSIS — N1831 Chronic kidney disease, stage 3a: Secondary | ICD-10-CM | POA: Diagnosis not present

## 2020-06-16 DIAGNOSIS — N3942 Incontinence without sensory awareness: Secondary | ICD-10-CM | POA: Diagnosis not present

## 2020-06-16 DIAGNOSIS — H9190 Unspecified hearing loss, unspecified ear: Secondary | ICD-10-CM | POA: Diagnosis not present

## 2020-06-16 DIAGNOSIS — Z6830 Body mass index (BMI) 30.0-30.9, adult: Secondary | ICD-10-CM | POA: Diagnosis not present

## 2020-06-17 ENCOUNTER — Telehealth: Payer: Self-pay

## 2020-06-17 DIAGNOSIS — E669 Obesity, unspecified: Secondary | ICD-10-CM | POA: Diagnosis not present

## 2020-06-17 DIAGNOSIS — N3942 Incontinence without sensory awareness: Secondary | ICD-10-CM | POA: Diagnosis not present

## 2020-06-17 DIAGNOSIS — E781 Pure hyperglyceridemia: Secondary | ICD-10-CM | POA: Diagnosis not present

## 2020-06-17 DIAGNOSIS — Z7901 Long term (current) use of anticoagulants: Secondary | ICD-10-CM | POA: Diagnosis not present

## 2020-06-17 DIAGNOSIS — E559 Vitamin D deficiency, unspecified: Secondary | ICD-10-CM | POA: Diagnosis not present

## 2020-06-17 DIAGNOSIS — K219 Gastro-esophageal reflux disease without esophagitis: Secondary | ICD-10-CM | POA: Diagnosis not present

## 2020-06-17 DIAGNOSIS — Z9181 History of falling: Secondary | ICD-10-CM | POA: Diagnosis not present

## 2020-06-17 DIAGNOSIS — G119 Hereditary ataxia, unspecified: Secondary | ICD-10-CM | POA: Diagnosis not present

## 2020-06-17 DIAGNOSIS — Z6830 Body mass index (BMI) 30.0-30.9, adult: Secondary | ICD-10-CM | POA: Diagnosis not present

## 2020-06-17 DIAGNOSIS — H9190 Unspecified hearing loss, unspecified ear: Secondary | ICD-10-CM | POA: Diagnosis not present

## 2020-06-17 DIAGNOSIS — N401 Enlarged prostate with lower urinary tract symptoms: Secondary | ICD-10-CM | POA: Diagnosis not present

## 2020-06-17 DIAGNOSIS — Z86718 Personal history of other venous thrombosis and embolism: Secondary | ICD-10-CM | POA: Diagnosis not present

## 2020-06-17 DIAGNOSIS — I739 Peripheral vascular disease, unspecified: Secondary | ICD-10-CM | POA: Diagnosis not present

## 2020-06-17 DIAGNOSIS — N1831 Chronic kidney disease, stage 3a: Secondary | ICD-10-CM | POA: Diagnosis not present

## 2020-06-17 DIAGNOSIS — I129 Hypertensive chronic kidney disease with stage 1 through stage 4 chronic kidney disease, or unspecified chronic kidney disease: Secondary | ICD-10-CM | POA: Diagnosis not present

## 2020-06-17 NOTE — Telephone Encounter (Signed)
Received a message that patient's wife had called requesting medical records to be sent to insurance company.Phone call placed to patient's wife to request clarification on what is needed and further discussion. VM left.

## 2020-06-22 DIAGNOSIS — M5489 Other dorsalgia: Secondary | ICD-10-CM | POA: Diagnosis not present

## 2020-06-22 DIAGNOSIS — N310 Uninhibited neuropathic bladder, not elsewhere classified: Secondary | ICD-10-CM | POA: Diagnosis not present

## 2020-06-22 DIAGNOSIS — N399 Disorder of urinary system, unspecified: Secondary | ICD-10-CM | POA: Diagnosis not present

## 2020-06-24 DIAGNOSIS — R102 Pelvic and perineal pain: Secondary | ICD-10-CM | POA: Diagnosis not present

## 2020-06-24 DIAGNOSIS — M5489 Other dorsalgia: Secondary | ICD-10-CM | POA: Diagnosis not present

## 2020-06-24 DIAGNOSIS — R350 Frequency of micturition: Secondary | ICD-10-CM | POA: Diagnosis not present

## 2020-06-25 DIAGNOSIS — N401 Enlarged prostate with lower urinary tract symptoms: Secondary | ICD-10-CM | POA: Diagnosis not present

## 2020-06-25 DIAGNOSIS — E669 Obesity, unspecified: Secondary | ICD-10-CM | POA: Diagnosis not present

## 2020-06-25 DIAGNOSIS — Z7901 Long term (current) use of anticoagulants: Secondary | ICD-10-CM | POA: Diagnosis not present

## 2020-06-25 DIAGNOSIS — E781 Pure hyperglyceridemia: Secondary | ICD-10-CM | POA: Diagnosis not present

## 2020-06-25 DIAGNOSIS — G119 Hereditary ataxia, unspecified: Secondary | ICD-10-CM | POA: Diagnosis not present

## 2020-06-25 DIAGNOSIS — Z9181 History of falling: Secondary | ICD-10-CM | POA: Diagnosis not present

## 2020-06-25 DIAGNOSIS — K219 Gastro-esophageal reflux disease without esophagitis: Secondary | ICD-10-CM | POA: Diagnosis not present

## 2020-06-25 DIAGNOSIS — I739 Peripheral vascular disease, unspecified: Secondary | ICD-10-CM | POA: Diagnosis not present

## 2020-06-25 DIAGNOSIS — E559 Vitamin D deficiency, unspecified: Secondary | ICD-10-CM | POA: Diagnosis not present

## 2020-06-25 DIAGNOSIS — I129 Hypertensive chronic kidney disease with stage 1 through stage 4 chronic kidney disease, or unspecified chronic kidney disease: Secondary | ICD-10-CM | POA: Diagnosis not present

## 2020-06-25 DIAGNOSIS — Z6830 Body mass index (BMI) 30.0-30.9, adult: Secondary | ICD-10-CM | POA: Diagnosis not present

## 2020-06-25 DIAGNOSIS — Z86718 Personal history of other venous thrombosis and embolism: Secondary | ICD-10-CM | POA: Diagnosis not present

## 2020-06-25 DIAGNOSIS — N1831 Chronic kidney disease, stage 3a: Secondary | ICD-10-CM | POA: Diagnosis not present

## 2020-06-25 DIAGNOSIS — N3942 Incontinence without sensory awareness: Secondary | ICD-10-CM | POA: Diagnosis not present

## 2020-06-25 DIAGNOSIS — H9190 Unspecified hearing loss, unspecified ear: Secondary | ICD-10-CM | POA: Diagnosis not present

## 2020-06-26 DIAGNOSIS — G119 Hereditary ataxia, unspecified: Secondary | ICD-10-CM | POA: Diagnosis not present

## 2020-06-26 DIAGNOSIS — H9190 Unspecified hearing loss, unspecified ear: Secondary | ICD-10-CM | POA: Diagnosis not present

## 2020-06-26 DIAGNOSIS — Z86718 Personal history of other venous thrombosis and embolism: Secondary | ICD-10-CM | POA: Diagnosis not present

## 2020-06-26 DIAGNOSIS — Z9181 History of falling: Secondary | ICD-10-CM | POA: Diagnosis not present

## 2020-06-26 DIAGNOSIS — Z7901 Long term (current) use of anticoagulants: Secondary | ICD-10-CM | POA: Diagnosis not present

## 2020-06-26 DIAGNOSIS — I739 Peripheral vascular disease, unspecified: Secondary | ICD-10-CM | POA: Diagnosis not present

## 2020-06-26 DIAGNOSIS — N401 Enlarged prostate with lower urinary tract symptoms: Secondary | ICD-10-CM | POA: Diagnosis not present

## 2020-06-26 DIAGNOSIS — N1831 Chronic kidney disease, stage 3a: Secondary | ICD-10-CM | POA: Diagnosis not present

## 2020-06-26 DIAGNOSIS — E781 Pure hyperglyceridemia: Secondary | ICD-10-CM | POA: Diagnosis not present

## 2020-06-26 DIAGNOSIS — Z6830 Body mass index (BMI) 30.0-30.9, adult: Secondary | ICD-10-CM | POA: Diagnosis not present

## 2020-06-26 DIAGNOSIS — I129 Hypertensive chronic kidney disease with stage 1 through stage 4 chronic kidney disease, or unspecified chronic kidney disease: Secondary | ICD-10-CM | POA: Diagnosis not present

## 2020-06-26 DIAGNOSIS — E559 Vitamin D deficiency, unspecified: Secondary | ICD-10-CM | POA: Diagnosis not present

## 2020-06-26 DIAGNOSIS — E669 Obesity, unspecified: Secondary | ICD-10-CM | POA: Diagnosis not present

## 2020-06-26 DIAGNOSIS — N3942 Incontinence without sensory awareness: Secondary | ICD-10-CM | POA: Diagnosis not present

## 2020-06-26 DIAGNOSIS — K219 Gastro-esophageal reflux disease without esophagitis: Secondary | ICD-10-CM | POA: Diagnosis not present

## 2020-07-01 DIAGNOSIS — N1831 Chronic kidney disease, stage 3a: Secondary | ICD-10-CM | POA: Diagnosis not present

## 2020-07-01 DIAGNOSIS — N401 Enlarged prostate with lower urinary tract symptoms: Secondary | ICD-10-CM | POA: Diagnosis not present

## 2020-07-01 DIAGNOSIS — Z7901 Long term (current) use of anticoagulants: Secondary | ICD-10-CM | POA: Diagnosis not present

## 2020-07-01 DIAGNOSIS — K219 Gastro-esophageal reflux disease without esophagitis: Secondary | ICD-10-CM | POA: Diagnosis not present

## 2020-07-01 DIAGNOSIS — E781 Pure hyperglyceridemia: Secondary | ICD-10-CM | POA: Diagnosis not present

## 2020-07-01 DIAGNOSIS — E559 Vitamin D deficiency, unspecified: Secondary | ICD-10-CM | POA: Diagnosis not present

## 2020-07-01 DIAGNOSIS — G119 Hereditary ataxia, unspecified: Secondary | ICD-10-CM | POA: Diagnosis not present

## 2020-07-01 DIAGNOSIS — E669 Obesity, unspecified: Secondary | ICD-10-CM | POA: Diagnosis not present

## 2020-07-01 DIAGNOSIS — I129 Hypertensive chronic kidney disease with stage 1 through stage 4 chronic kidney disease, or unspecified chronic kidney disease: Secondary | ICD-10-CM | POA: Diagnosis not present

## 2020-07-01 DIAGNOSIS — I739 Peripheral vascular disease, unspecified: Secondary | ICD-10-CM | POA: Diagnosis not present

## 2020-07-01 DIAGNOSIS — H9190 Unspecified hearing loss, unspecified ear: Secondary | ICD-10-CM | POA: Diagnosis not present

## 2020-07-01 DIAGNOSIS — N3942 Incontinence without sensory awareness: Secondary | ICD-10-CM | POA: Diagnosis not present

## 2020-07-01 DIAGNOSIS — Z86718 Personal history of other venous thrombosis and embolism: Secondary | ICD-10-CM | POA: Diagnosis not present

## 2020-07-01 DIAGNOSIS — Z6830 Body mass index (BMI) 30.0-30.9, adult: Secondary | ICD-10-CM | POA: Diagnosis not present

## 2020-07-01 DIAGNOSIS — Z9181 History of falling: Secondary | ICD-10-CM | POA: Diagnosis not present

## 2020-07-07 ENCOUNTER — Encounter: Payer: Self-pay | Admitting: Nurse Practitioner

## 2020-07-07 ENCOUNTER — Other Ambulatory Visit: Payer: PPO | Admitting: Nurse Practitioner

## 2020-07-07 ENCOUNTER — Other Ambulatory Visit: Payer: Self-pay

## 2020-07-07 DIAGNOSIS — Z515 Encounter for palliative care: Secondary | ICD-10-CM

## 2020-07-07 DIAGNOSIS — F0280 Dementia in other diseases classified elsewhere without behavioral disturbance: Secondary | ICD-10-CM | POA: Diagnosis not present

## 2020-07-07 DIAGNOSIS — G309 Alzheimer's disease, unspecified: Secondary | ICD-10-CM | POA: Diagnosis not present

## 2020-07-07 NOTE — Progress Notes (Signed)
[facility] Consult Note Telephone: [phone]  Fax: [phone]  PATIENT NAME: DENY CHEVEZ DOB: 08-27-1941 MRN: [id number]  PRIMARY CARE PROVIDER:   Dion Body, MD  REFERRING PROVIDER:  Dion Body, MD [facility] [place],  [place] [address] RESPONSIBLE PARTY:   Trevaughn Schear, wife H: [phone] C: [phone]  RECOMMENDATIONS and PLAN: 1.ACP: DNR  2. Debility/weakness; progressive, discussed option of Hospice with family; Hospice Physicians to review for eligibility. Hospice Physician deemed Hospice eligible.   3.Palliative care encounter; Palliative medicine team will continue to support patient, patient's family, and medical team. Visit consisted of counseling and education dealing with the complex and emotionally intense issues of symptom management and palliative care in the setting of serious and potentially life-threatening illness  4. Follow-Palliative to transition to Hospice   I spent 90 minutes providing this consultation,  from 11:00am to 12:30pm . More than 50% of the time in this consultation was spent coordinating communication.   HISTORY OF PRESENT ILLNESS:  HIKEEM ANDERSSON is an 79 y.o. year old male with multiple medical problems includingAlzheimer's dementia, cerebellar ataxia, Parkinsonian syndrome, CKD stage 3, BPH, HTN, h/o TIA. In-person follow up palliative care visit with Mr. Bugaj, wife [name], two daughters and a son. We talked about purpose of palliative care visit. We talked about how Mr. Harts has been feeling. Mr. Klemz endorses that some days are better than others. Mr. Kon endorses that he is hoping he can still continue to try to walk. Therapy continues to work with Mr. Wares with little progress. [name] endorses that there are exercises that they can do but Mr. Fidalgo declines when asked if he wants to try. We talked about the functional changes as he is a hoyer  lift and total care now. Mr. Chenier is now incontinent urine and having episodes of bowel incontinence without sensory awareness. Mr. Vandevoorde has developed some red and areas on his backside that [name] has been tending to. We talked about functional ability upper extremities as he has exercises that he can do including weights but often does not feel like it. We talked about his appetite which is good smaller proportions and he has lost about 10 lbs. [name] endorses that he does look thinner. We talked about Mr. Shakespeare sleeping more. Mr. Manville is having more difficulty with memory, processing and some intermittent confusion. Family endorses things have really changed over the last six weeks overall decline cognitively and functionally. We talked about medical goals of care with wishes for more focus on comfort care with DNR in place. We talked about role of palliative care and plan of care. Mr Leron Croak talked about what his prognosis is. We talked at length about chronic disease progression. We talked about his functional changes. We talked about his wishes were still wanting to walk again although his body is not able to. Mr. Streater talked about the challenges with caregivers that have been coming in to help although he has not being able to succeed to the goals or the progress which is frustrating. We talked about therapy which is getting ready to end this week. We talked about the caregiving help that comes in to help [place]. Mr. Deleeuw has a gentleman that comes in in the morning to get him up, then puts him back to bed and then evening. We talked about the challenges with overall ability. Mr. Denzer taked at length about quality-of-life, importance of Healthcare professionals being transparent  insane the harder conversations as he was very thankful for today's conversation. 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 We talked about quality of life at length. We talked about coping  strategies.We talked about role of palliative care and plan of care. We talked about therapy which is getting ready to end this week. We talked about the caregiving help that comes in to help [place]. We talked about the challenges with overall ability. We talked about overall decline, disease progression and option of possibly Hospice Services at home. We talked about Hospice Services Under Medicare benefit. We talked about what Hospice would provide and what that would look like in the home. We talked about prognosis of possible 6 months or less. We talked about option of reviewing by Hospice Physicians for eligibility. Mr. Toepfer and [name] wife both in agreement to have case reviewed for eligibility. We talked about Mr. Weible is eligible will contact primary provider for Hospice order. We did talk about Eliquis and possibly if he was eligible may need to switch to Coumadin. Or other option is to get 90 day supply for 3 months prior to Hospice initiating services. Discuss that if found not hospice eligible at this time will continue to monitor and follow under palliative care. Mr. Tsang and [name] both in agreement as well as family. Therapeutic listening and emotional support provided. Contact information. Questions answer to satisfaction.  I called and notified [name], wife of Hospice eligibility. Agree to proceed  Palliative Care was asked to help to continue to address goals of care.   CODE STATUS: DNR  PPS: 30% HOSPICE ELIGIBILITY/DIAGNOSIS: yes per Hospice Physicians  PAST MEDICAL HISTORY:  Past Medical History:  Diagnosis Date  . BPH (benign prostatic hyperplasia)   . CKD (chronic kidney disease), stage III   . Elevated blood uric acid level   . GERD (gastroesophageal reflux disease)   . History of echocardiogram    a.02/2016 Echo: EF 50-55%. Mild LVH. Mild AI. Triv MR/TR.  Marland Kitchen Hyperparathyroidism ([place])   . Hypertension   . Hypertriglyceridemia   . Mixed hyperlipidemia   . TIA (transient  ischemic attack) 2013    SOCIAL HX:  Social History   Tobacco Use  . Smoking status: Never Smoker  . Smokeless tobacco: Never Used  Substance Use Topics  . Alcohol use: No    Alcohol/week: 0.0 standard drinks    ALLERGIES: No Known Allergies   PERTINENT MEDICATIONS:  Outpatient Encounter Medications as of 07/07/2020  Medication Sig  . apixaban (ELIQUIS) 5 MG TABS tablet Take 2 tablets (10 mg total) by mouth 2 (two) times daily. Then take 5 mg (1 tab) bid  Starting 06/08/2019 (Patient taking differently: Take 5 mg by mouth 2 (two) times daily. )  . fenofibrate 160 MG tablet Take 160 mg by mouth daily.   . hydrocortisone (ANUSOL-HC) 25 MG suppository Place 1 suppository (25 mg total) rectally 2 (two) times daily. For 7 days  . oxybutynin (DITROPAN-XL) 5 MG 24 hr tablet Take 1 tablet (5 mg total) by mouth daily.  Marland Kitchen VITAMIN D PO Take 400 Units by mouth daily.   No facility-administered encounter medications on file as of 07/07/2020.    PHYSICAL EXAM:   General: severely debilitated, pleasant male,chronically ill Cardiovascular: regular rate and rhythm Pulmonary: clear ant fields Neurological: functional quadriplegic  Amiri Riechers Z Johnatha Zeidman, NP   \

## 2020-07-10 DIAGNOSIS — I129 Hypertensive chronic kidney disease with stage 1 through stage 4 chronic kidney disease, or unspecified chronic kidney disease: Secondary | ICD-10-CM | POA: Diagnosis not present

## 2020-07-10 DIAGNOSIS — Z86718 Personal history of other venous thrombosis and embolism: Secondary | ICD-10-CM | POA: Diagnosis not present

## 2020-07-10 DIAGNOSIS — Z7901 Long term (current) use of anticoagulants: Secondary | ICD-10-CM | POA: Diagnosis not present

## 2020-07-10 DIAGNOSIS — N1831 Chronic kidney disease, stage 3a: Secondary | ICD-10-CM | POA: Diagnosis not present

## 2020-07-10 DIAGNOSIS — I739 Peripheral vascular disease, unspecified: Secondary | ICD-10-CM | POA: Diagnosis not present

## 2020-07-10 DIAGNOSIS — E669 Obesity, unspecified: Secondary | ICD-10-CM | POA: Diagnosis not present

## 2020-07-10 DIAGNOSIS — Z6830 Body mass index (BMI) 30.0-30.9, adult: Secondary | ICD-10-CM | POA: Diagnosis not present

## 2020-07-10 DIAGNOSIS — E559 Vitamin D deficiency, unspecified: Secondary | ICD-10-CM | POA: Diagnosis not present

## 2020-07-10 DIAGNOSIS — E781 Pure hyperglyceridemia: Secondary | ICD-10-CM | POA: Diagnosis not present

## 2020-07-10 DIAGNOSIS — N3942 Incontinence without sensory awareness: Secondary | ICD-10-CM | POA: Diagnosis not present

## 2020-07-10 DIAGNOSIS — K219 Gastro-esophageal reflux disease without esophagitis: Secondary | ICD-10-CM | POA: Diagnosis not present

## 2020-07-10 DIAGNOSIS — Z9181 History of falling: Secondary | ICD-10-CM | POA: Diagnosis not present

## 2020-07-10 DIAGNOSIS — N401 Enlarged prostate with lower urinary tract symptoms: Secondary | ICD-10-CM | POA: Diagnosis not present

## 2020-07-10 DIAGNOSIS — H9190 Unspecified hearing loss, unspecified ear: Secondary | ICD-10-CM | POA: Diagnosis not present

## 2020-07-10 DIAGNOSIS — G119 Hereditary ataxia, unspecified: Secondary | ICD-10-CM | POA: Diagnosis not present

## 2020-07-16 DIAGNOSIS — I739 Peripheral vascular disease, unspecified: Secondary | ICD-10-CM | POA: Diagnosis not present

## 2020-07-16 DIAGNOSIS — Z86718 Personal history of other venous thrombosis and embolism: Secondary | ICD-10-CM | POA: Diagnosis not present

## 2020-07-16 DIAGNOSIS — N1831 Chronic kidney disease, stage 3a: Secondary | ICD-10-CM | POA: Diagnosis not present

## 2020-07-16 DIAGNOSIS — N3942 Incontinence without sensory awareness: Secondary | ICD-10-CM | POA: Diagnosis not present

## 2020-07-16 DIAGNOSIS — E781 Pure hyperglyceridemia: Secondary | ICD-10-CM | POA: Diagnosis not present

## 2020-07-16 DIAGNOSIS — G119 Hereditary ataxia, unspecified: Secondary | ICD-10-CM | POA: Diagnosis not present

## 2020-07-16 DIAGNOSIS — Z9181 History of falling: Secondary | ICD-10-CM | POA: Diagnosis not present

## 2020-07-16 DIAGNOSIS — Z6830 Body mass index (BMI) 30.0-30.9, adult: Secondary | ICD-10-CM | POA: Diagnosis not present

## 2020-07-16 DIAGNOSIS — E559 Vitamin D deficiency, unspecified: Secondary | ICD-10-CM | POA: Diagnosis not present

## 2020-07-16 DIAGNOSIS — K219 Gastro-esophageal reflux disease without esophagitis: Secondary | ICD-10-CM | POA: Diagnosis not present

## 2020-07-16 DIAGNOSIS — N401 Enlarged prostate with lower urinary tract symptoms: Secondary | ICD-10-CM | POA: Diagnosis not present

## 2020-07-16 DIAGNOSIS — E669 Obesity, unspecified: Secondary | ICD-10-CM | POA: Diagnosis not present

## 2020-07-16 DIAGNOSIS — H9190 Unspecified hearing loss, unspecified ear: Secondary | ICD-10-CM | POA: Diagnosis not present

## 2020-07-16 DIAGNOSIS — I129 Hypertensive chronic kidney disease with stage 1 through stage 4 chronic kidney disease, or unspecified chronic kidney disease: Secondary | ICD-10-CM | POA: Diagnosis not present

## 2020-07-16 DIAGNOSIS — Z7901 Long term (current) use of anticoagulants: Secondary | ICD-10-CM | POA: Diagnosis not present

## 2020-07-23 IMAGING — DX PORTABLE CHEST - 1 VIEW
1 series · 1 of 1 positions shown · non-contrast
Comparison: None.

CLINICAL DATA: Low back pain.  Shortness of breath.

EXAM:
PORTABLE CHEST 1 VIEW

[chest ap]
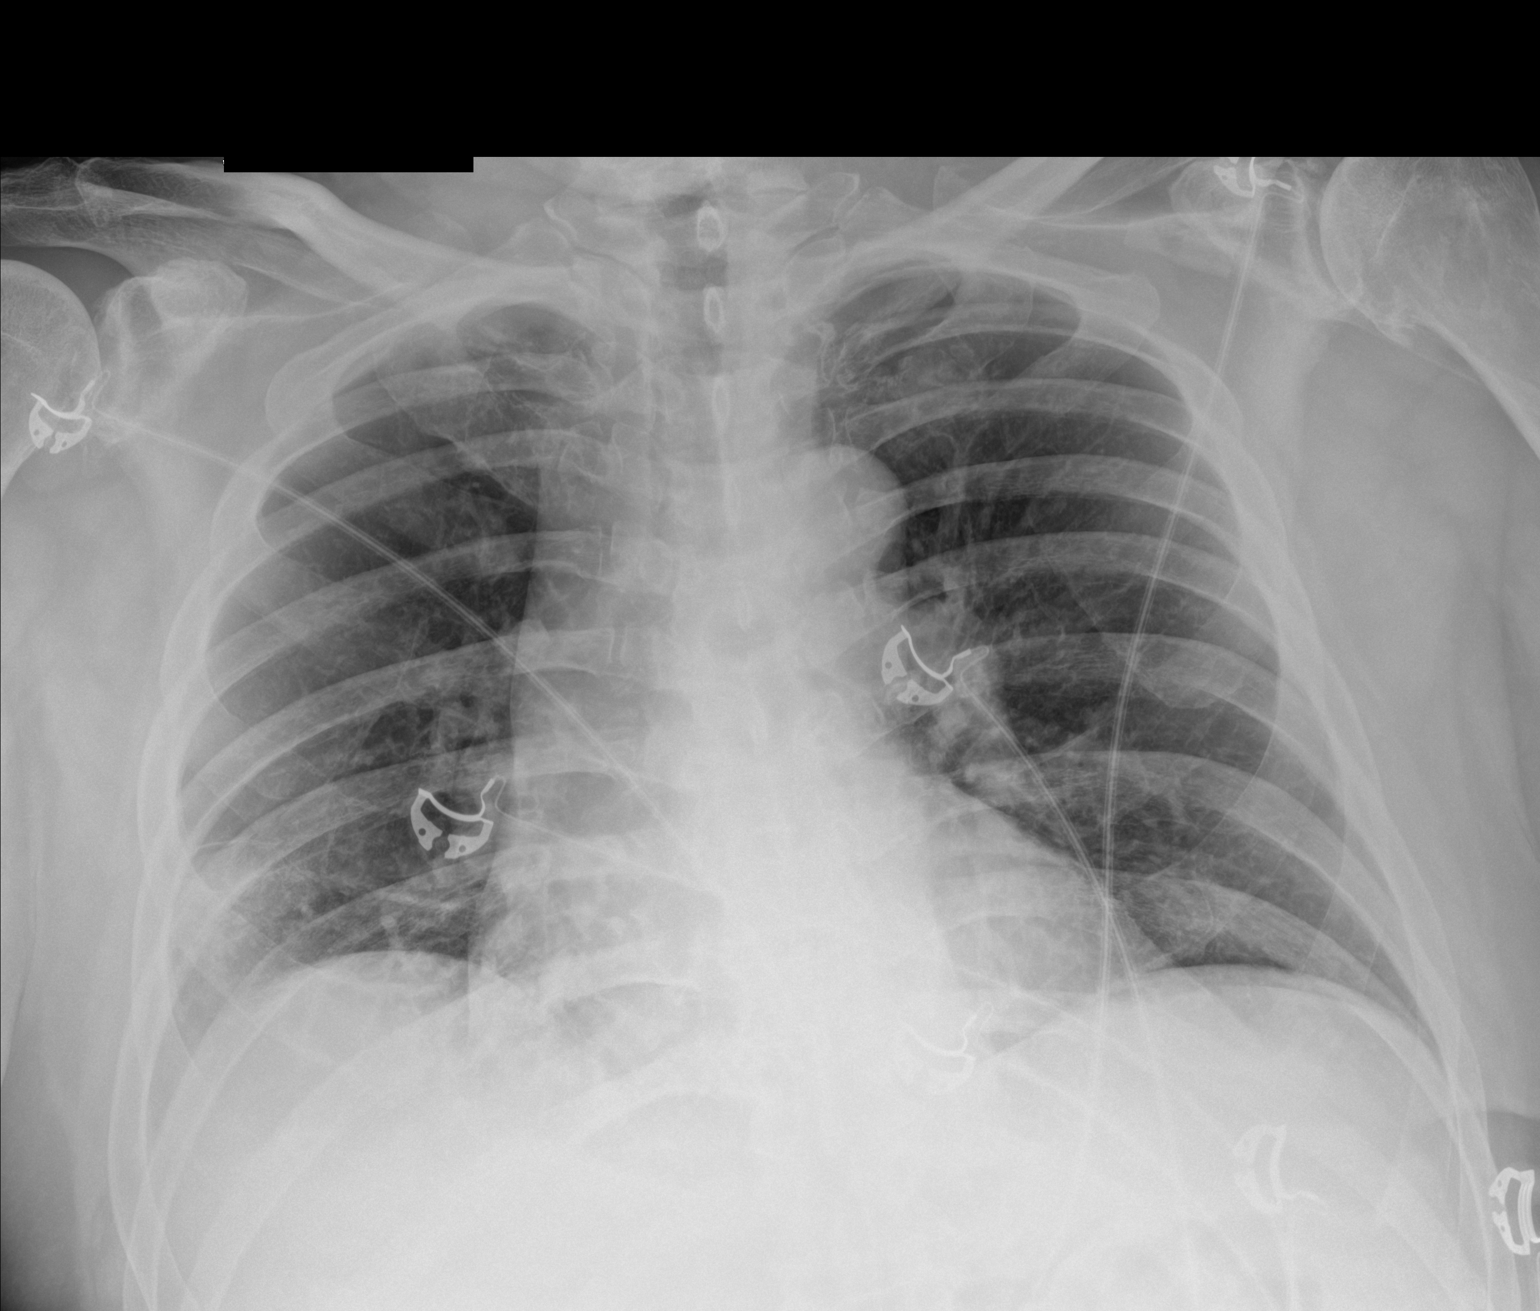

[1 of 1 positions shown; findings below may reference images not displayed]

FINDINGS: Mild opacity in the lateral right lung base. The heart, hila,
mediastinum, lungs, and pleura are otherwise normal.
IMPRESSION: Mild opacity in the lateral right lung base may represent infiltrate
or atelectasis. Recommend short-term follow-up to ensure resolution.

## 2021-05-12 DEATH — deceased
